# Patient Record
Sex: Male | Born: 1975 | Race: White | Hispanic: No | Marital: Married | Smoking: Former smoker
Health system: Southern US, Community
[De-identification: ages and names within clinical notes are randomized; demographics above are authoritative.]

## PROBLEM LIST (undated history)

## (undated) DIAGNOSIS — K219 Gastro-esophageal reflux disease without esophagitis: Secondary | ICD-10-CM

## (undated) DIAGNOSIS — E119 Type 2 diabetes mellitus without complications: Secondary | ICD-10-CM

## (undated) DIAGNOSIS — G4730 Sleep apnea, unspecified: Secondary | ICD-10-CM

## (undated) DIAGNOSIS — E66813 Obesity, class 3: Secondary | ICD-10-CM

## (undated) DIAGNOSIS — I10 Essential (primary) hypertension: Secondary | ICD-10-CM

## (undated) DIAGNOSIS — E785 Hyperlipidemia, unspecified: Secondary | ICD-10-CM

## (undated) DIAGNOSIS — M109 Gout, unspecified: Secondary | ICD-10-CM

## (undated) HISTORY — DX: Gastro-esophageal reflux disease without esophagitis: K21.9

## (undated) HISTORY — DX: Obesity, class 3: E66.813

## (undated) HISTORY — PX: SPINE SURGERY: SHX786

## (undated) HISTORY — DX: Essential (primary) hypertension: I10

## (undated) HISTORY — DX: Sleep apnea, unspecified: G47.30

## (undated) HISTORY — DX: Hyperlipidemia, unspecified: E78.5

## (undated) HISTORY — DX: Type 2 diabetes mellitus without complications: E11.9

## (undated) HISTORY — PX: BACK SURGERY: SHX140

## (undated) HISTORY — DX: Morbid (severe) obesity due to excess calories: E66.01

---

## 1998-03-30 ENCOUNTER — Emergency Department (HOSPITAL_COMMUNITY): Admission: EM | Admit: 1998-03-30 | Discharge: 1998-03-30 | Payer: Self-pay | Admitting: Emergency Medicine

## 1999-03-24 ENCOUNTER — Emergency Department (HOSPITAL_COMMUNITY): Admission: EM | Admit: 1999-03-24 | Discharge: 1999-03-24 | Payer: Self-pay

## 1999-07-26 ENCOUNTER — Emergency Department (HOSPITAL_COMMUNITY): Admission: EM | Admit: 1999-07-26 | Discharge: 1999-07-26 | Payer: Self-pay

## 1999-10-29 ENCOUNTER — Emergency Department (HOSPITAL_COMMUNITY): Admission: EM | Admit: 1999-10-29 | Discharge: 1999-10-29 | Payer: Self-pay | Admitting: Emergency Medicine

## 2001-03-06 ENCOUNTER — Ambulatory Visit (HOSPITAL_COMMUNITY): Admission: RE | Admit: 2001-03-06 | Discharge: 2001-03-06 | Payer: Self-pay | Admitting: Family Medicine

## 2001-03-06 ENCOUNTER — Encounter: Payer: Self-pay | Admitting: Family Medicine

## 2004-12-23 ENCOUNTER — Emergency Department (HOSPITAL_COMMUNITY): Admission: EM | Admit: 2004-12-23 | Discharge: 2004-12-23 | Payer: Self-pay | Admitting: Emergency Medicine

## 2010-01-27 ENCOUNTER — Emergency Department (HOSPITAL_COMMUNITY): Admission: EM | Admit: 2010-01-27 | Discharge: 2010-01-27 | Payer: Self-pay | Admitting: Emergency Medicine

## 2010-03-05 ENCOUNTER — Encounter: Admission: RE | Admit: 2010-03-05 | Discharge: 2010-03-05 | Payer: Self-pay | Admitting: Chiropractic Medicine

## 2010-05-28 ENCOUNTER — Ambulatory Visit (HOSPITAL_COMMUNITY): Admission: RE | Admit: 2010-05-28 | Discharge: 2010-05-29 | Payer: Self-pay | Admitting: Neurological Surgery

## 2010-10-21 LAB — SURGICAL PCR SCREEN
MRSA, PCR: NEGATIVE
Staphylococcus aureus: NEGATIVE

## 2010-10-21 LAB — CBC
HCT: 41.9 % (ref 39.0–52.0)
Hemoglobin: 14.5 g/dL (ref 13.0–17.0)
MCH: 30.5 pg (ref 26.0–34.0)
MCHC: 34.6 g/dL (ref 30.0–36.0)
MCV: 88.2 fL (ref 78.0–100.0)
Platelets: 184 10*3/uL (ref 150–400)
RBC: 4.75 MIL/uL (ref 4.22–5.81)
RDW: 12.6 % (ref 11.5–15.5)
WBC: 6.7 10*3/uL (ref 4.0–10.5)

## 2010-10-26 LAB — URINALYSIS, ROUTINE W REFLEX MICROSCOPIC
Ketones, ur: NEGATIVE mg/dL
Protein, ur: NEGATIVE mg/dL
Urobilinogen, UA: 0.2 mg/dL (ref 0.0–1.0)

## 2011-04-16 ENCOUNTER — Other Ambulatory Visit: Payer: Self-pay | Admitting: Internal Medicine

## 2011-04-16 DIAGNOSIS — R109 Unspecified abdominal pain: Secondary | ICD-10-CM

## 2011-04-21 ENCOUNTER — Other Ambulatory Visit: Payer: Self-pay | Admitting: Internal Medicine

## 2011-04-21 ENCOUNTER — Ambulatory Visit
Admission: RE | Admit: 2011-04-21 | Discharge: 2011-04-21 | Disposition: A | Discharge status: Home / Self Care | Payer: BC Managed Care – PPO | Source: Ambulatory Visit | Attending: Internal Medicine | Admitting: Internal Medicine

## 2011-04-21 DIAGNOSIS — R109 Unspecified abdominal pain: Secondary | ICD-10-CM

## 2011-04-21 MED ORDER — IOHEXOL 300 MG/ML  SOLN
125.0000 mL | Freq: Once | INTRAMUSCULAR | Status: AC | PRN
  Administered 2011-04-21: 125 mL via INTRAVENOUS

## 2011-05-24 ENCOUNTER — Encounter: Payer: Self-pay | Admitting: Internal Medicine

## 2011-05-28 ENCOUNTER — Ambulatory Visit (INDEPENDENT_AMBULATORY_CARE_PROVIDER_SITE_OTHER): Payer: BC Managed Care – PPO | Admitting: Internal Medicine

## 2011-05-28 ENCOUNTER — Encounter: Payer: Self-pay | Admitting: Internal Medicine

## 2011-05-28 VITALS — BP 128/86 | HR 80 | Ht 75.0 in | Wt 387.0 lb

## 2011-05-28 DIAGNOSIS — R933 Abnormal findings on diagnostic imaging of other parts of digestive tract: Secondary | ICD-10-CM

## 2011-05-28 NOTE — Progress Notes (Addendum)
  Subjective:    Patient ID: Miguel Sanders, male    DOB: 18-Jan-1976, 35 y.o.   MRN: [id number]  HPI This man presents with about a two-month history of right flank pain, he notices it when he sleeps and after getting up. After moving around for a couple of hours the pain seems to goal weight. No change in urination. He saw his primary care provider and had a CT of the abdomen and pelvis ordered as part of the workup. That demonstrated possible thickening in the distal stomach near the pylorus a slightly enlarged inferior gastrohepatic ligament lymph node. No pain with eating. No nausea or vomiting or other GI symptoms. No prior problems with right flank pain in the past, either. He does have a history of back pain. He did have lumbar disc surgery last year, but was having pain on the left side at that time. No recent regular use of ibuprofen or other anti-inflammatories.   Review of Systems As above, all other ROS negative    Objective:   Physical Exam Morbid obesity acute distress Eyes anicteric Mouth posterior pharynx clear, dentition in good repair Lungs clear Heart S1-S2 no thrills Abdomen is morbidly obese soft and nontender without organomegaly or mass He is awake and alert and oriented x3 with normal mood and affect       Assessment & Plan:  Abnormal stomach on CT abdomen and pelvis. Etiology unclear. He has a thickened distal stomach and mildly enlarged intrahepatic ligament lymph node as well. This may be artifact or real pathology.  I recommended an EGD to sort this out. Risks benefits indications are explained he understands and agrees to proceed. We'll perform hospital due to obesity, greater than 350 pounds. As far as obesity is concerned he is now below 400 pounds and is trying to lose weight and is aware of the problem  The right flank pain sounds musculoskeletal no etiology found on CT scan. Also note that September 25 labs show a normal comprehensive metabolic panel,  uric acid high at 9.4, a lipase is normal. Also note that the patient was recently laid off and would like to complete any workup before his insurance coverage terminates at the end of the month.

## 2011-05-28 NOTE — Patient Instructions (Signed)
You have been scheduled for an Endoscopy at [facility] on 06/04/11 at 9:15 am with separate instructions given.

## 2011-06-04 ENCOUNTER — Encounter: Payer: BC Managed Care – PPO | Admitting: Internal Medicine

## 2011-11-20 ENCOUNTER — Encounter: Payer: BC Managed Care – PPO | Admitting: Emergency Medicine

## 2011-11-20 NOTE — Progress Notes (Signed)
  Subjective:    Patient ID: Miguel Sanders, male    DOB: 04/16/1976, 36 y.o.   MRN: [id number]  HPIleft not seen    Review of Systems     Objective:   Physical Exam        Assessment & Plan:

## 2012-01-17 NOTE — Progress Notes (Signed)
This encounter was created in error - please disregard.

## 2012-02-02 ENCOUNTER — Encounter (HOSPITAL_COMMUNITY): Payer: Self-pay | Admitting: Emergency Medicine

## 2012-02-02 ENCOUNTER — Emergency Department (HOSPITAL_COMMUNITY)
Admission: EM | Admit: 2012-02-02 | Discharge: 2012-02-02 | Disposition: A | Discharge status: Home / Self Care | Payer: Self-pay | Attending: Emergency Medicine | Admitting: Emergency Medicine

## 2012-02-02 DIAGNOSIS — R7309 Other abnormal glucose: Secondary | ICD-10-CM | POA: Insufficient documentation

## 2012-02-02 DIAGNOSIS — R739 Hyperglycemia, unspecified: Secondary | ICD-10-CM

## 2012-02-02 DIAGNOSIS — Z87891 Personal history of nicotine dependence: Secondary | ICD-10-CM | POA: Insufficient documentation

## 2012-02-02 DIAGNOSIS — R109 Unspecified abdominal pain: Secondary | ICD-10-CM | POA: Insufficient documentation

## 2012-02-02 HISTORY — DX: Gout, unspecified: M10.9

## 2012-02-02 LAB — CBC
HCT: 38.1 % — ABNORMAL LOW (ref 39.0–52.0)
Hemoglobin: 13.3 g/dL (ref 13.0–17.0)
MCH: 29.9 pg (ref 26.0–34.0)
MCHC: 34.9 g/dL (ref 30.0–36.0)
RDW: 12.5 % (ref 11.5–15.5)

## 2012-02-02 LAB — DIFFERENTIAL
Basophils Absolute: 0 10*3/uL (ref 0.0–0.1)
Basophils Relative: 0 % (ref 0–1)
Eosinophils Absolute: 0.3 10*3/uL (ref 0.0–0.7)
Monocytes Absolute: 0.5 10*3/uL (ref 0.1–1.0)
Monocytes Relative: 7 % (ref 3–12)
Neutro Abs: 4.3 10*3/uL (ref 1.7–7.7)
Neutrophils Relative %: 58 % (ref 43–77)

## 2012-02-02 LAB — URINALYSIS, ROUTINE W REFLEX MICROSCOPIC
Bilirubin Urine: NEGATIVE
Ketones, ur: NEGATIVE mg/dL
Leukocytes, UA: NEGATIVE
Nitrite: NEGATIVE
Protein, ur: NEGATIVE mg/dL

## 2012-02-02 LAB — URINE MICROSCOPIC-ADD ON

## 2012-02-02 LAB — COMPREHENSIVE METABOLIC PANEL
AST: 28 U/L (ref 0–37)
Albumin: 3.2 g/dL — ABNORMAL LOW (ref 3.5–5.2)
BUN: 8 mg/dL (ref 6–23)
Chloride: 100 mEq/L (ref 96–112)
Creatinine, Ser: 0.81 mg/dL (ref 0.50–1.35)
Total Protein: 7.2 g/dL (ref 6.0–8.3)

## 2012-02-02 LAB — LIPASE, BLOOD: Lipase: 27 U/L (ref 11–59)

## 2012-02-02 MED ORDER — HYDROCODONE-ACETAMINOPHEN 5-500 MG PO TABS: 1.0000 | ORAL_TABLET | Freq: Four times a day (QID) | ORAL | Status: AC | PRN

## 2012-02-02 MED ORDER — METFORMIN HCL 500 MG PO TABS: 500.0000 mg | ORAL_TABLET | Freq: Two times a day (BID) | ORAL | Status: DC

## 2012-02-02 MED ORDER — HYDROCODONE-ACETAMINOPHEN 5-325 MG PO TABS
1.0000 | ORAL_TABLET | Freq: Once | ORAL | Status: AC
  Administered 2012-02-02: 1 via ORAL
  Filled 2012-02-02: qty 1

## 2012-02-02 NOTE — ED Provider Notes (Signed)
History     CSN: [id number]  Arrival date & time 02/02/12  4098   First MD Initiated Contact with Patient 02/02/12 [phone]      Chief Complaint  Patient presents with  . Flank Pain    (Consider location/radiation/quality/duration/timing/severity/associated sxs/prior treatment) Patient is a 36 y.o. male presenting with flank pain. The history is provided by the patient.  Flank Pain This is a new problem. The current episode started today. Associated symptoms include abdominal pain. Pertinent negatives include no chest pain, chills, fever, nausea or vomiting.  Pt states he was awaken from sleep by  A pain in his right flank. States pain is dull, constant, 5/10. Radiates around to the front. No fever, chills, nausea, vomiting, SOB. Denies prior evaluations for the same. Normal BM and urine this morning.   Past Medical History  Diagnosis Date  . Gout   . Gout     Past Surgical History  Procedure Date  . Back surgery     Family History  Problem Relation Age of Onset  . Colon cancer Maternal Grandfather 75  . Cancer Other   . Hypertension Other     History  Substance Use Topics  . Smoking status: Former Smoker    Quit date: 08/09/2009  . Smokeless tobacco: Never Used  . Alcohol Use: No      Review of Systems  Constitutional: Negative for fever and chills.  Respiratory: Negative for chest tightness and wheezing.   Cardiovascular: Negative for chest pain.  Gastrointestinal: Positive for abdominal pain. Negative for nausea, vomiting, diarrhea, constipation and blood in stool.  Genitourinary: Positive for flank pain. Negative for dysuria and hematuria.    Allergies  Review of patient's allergies indicates no known allergies.  Home Medications   Current Outpatient Rx  Name Route Sig Dispense Refill  . IBUPROFEN 200 MG PO TABS Oral Take 200 mg by mouth every 6 (six) hours as needed.      Marland Kitchen HYDROCODONE-ACETAMINOPHEN 5-325 MG PO TABS Oral Take 1 tablet by mouth every 6  (six) hours as needed.        BP 140/82  Pulse 89  Temp 98.3 F (36.8 C) (Oral)  Resp 20  SpO2 98%  Physical Exam  Nursing note and vitals reviewed. Constitutional: He is oriented to person, place, and time. He appears well-developed and well-nourished. No distress.  Eyes: Conjunctivae are normal.  Neck: Neck supple.  Cardiovascular: Normal rate, regular rhythm and normal heart sounds.   Pulmonary/Chest: Effort normal and breath sounds normal. No respiratory distress. He has no wheezes. He has no rales.  Abdominal: Soft. Bowel sounds are normal. There is tenderness.       Abdomen obese, tender to palpation in right flank. No CVA tenderness  Neurological: He is alert and oriented to person, place, and time.  Skin: Skin is warm and dry.  Psychiatric: He has a normal mood and affect.    ED Course  Procedures (including critical care time)  6:25 AM Pt seen and examined, pain in right flank. No hx of the same, although there is a CT from 9/12 which was ordered for the same complaint. Labs ordered, ua pending. Pt does not want pain medication at this time.   Results for orders placed during the hospital encounter of 02/02/12  URINALYSIS, ROUTINE W REFLEX MICROSCOPIC      Component Value Range   Color, Urine YELLOW  YELLOW   APPearance CLEAR  CLEAR   Specific Gravity, Urine 1.031 (*) 1.005 -  1.030   pH 5.5  5.0 - 8.0   Glucose, UA >1000 (*) NEGATIVE mg/dL   Hgb urine dipstick NEGATIVE  NEGATIVE   Bilirubin Urine NEGATIVE  NEGATIVE   Ketones, ur NEGATIVE  NEGATIVE mg/dL   Protein, ur NEGATIVE  NEGATIVE mg/dL   Urobilinogen, UA 0.2  0.0 - 1.0 mg/dL   Nitrite NEGATIVE  NEGATIVE   Leukocytes, UA NEGATIVE  NEGATIVE  CBC      Component Value Range   WBC 7.3  4.0 - 10.5 K/uL   RBC 4.45  4.22 - 5.81 MIL/uL   Hemoglobin 13.3  13.0 - 17.0 g/dL   HCT 16.1 (*) 09.6 - 04.5 %   MCV 85.6  78.0 - 100.0 fL   MCH 29.9  26.0 - 34.0 pg   MCHC 34.9  30.0 - 36.0 g/dL   RDW 40.9  81.1 - 91.4  %   Platelets 174  150 - 400 K/uL  DIFFERENTIAL      Component Value Range   Neutrophils Relative 58  43 - 77 %   Neutro Abs 4.3  1.7 - 7.7 K/uL   Lymphocytes Relative 30  12 - 46 %   Lymphs Abs 2.2  0.7 - 4.0 K/uL   Monocytes Relative 7  3 - 12 %   Monocytes Absolute 0.5  0.1 - 1.0 K/uL   Eosinophils Relative 5  0 - 5 %   Eosinophils Absolute 0.3  0.0 - 0.7 K/uL   Basophils Relative 0  0 - 1 %   Basophils Absolute 0.0  0.0 - 0.1 K/uL  COMPREHENSIVE METABOLIC PANEL      Component Value Range   Sodium 133 (*) 135 - 145 mEq/L   Potassium 3.5  3.5 - 5.1 mEq/L   Chloride 100  96 - 112 mEq/L   CO2 23  19 - 32 mEq/L   Glucose, Bld 264 (*) 70 - 99 mg/dL   BUN 8  6 - 23 mg/dL   Creatinine, Ser 7.82  0.50 - 1.35 mg/dL   Calcium 8.9  8.4 - 95.6 mg/dL   Total Protein 7.2  6.0 - 8.3 g/dL   Albumin 3.2 (*) 3.5 - 5.2 g/dL   AST 28  0 - 37 U/L   ALT 43  0 - 53 U/L   Alkaline Phosphatase 88  39 - 117 U/L   Total Bilirubin 0.2 (*) 0.3 - 1.2 mg/dL   GFR calc non Af Amer >90  >90 mL/min   GFR calc Af Amer >90  >90 mL/min  LIPASE, BLOOD      Component Value Range   Lipase 27  11 - 59 U/L  URINE MICROSCOPIC-ADD ON      Component Value Range   Squamous Epithelial / LPF RARE  RARE   WBC, UA 0-2  <3 WBC/hpf   RBC / HPF 0-2  <3 RBC/hpf   Bacteria, UA FEW (*) RARE   Urine-Other MUCOUS PRESENT    GLUCOSE, CAPILLARY      Component Value Range   Glucose-Capillary 283 (*) 70 - 99 mg/dL   Comment 1 Notify RN     No results found.  Pt with new hyperglycemia. Rest of the labs and UA unremarkable.  Abdomen non tender other then in right flank. No acute abdomen. It is possible that pt may have a kidney stone although no hemoglobin noted in his UA. Also possible that the pain could be related to his back or musculoskeletal given there is no  abdominal tenderness on exam. Will start on metformin for diabetes, pain medications for several days. Will follow up with PCP. Instructed to return if fever,  nausea, vomiting, abdominal pain.    1. Hyperglycemia   2. Acute right flank pain       MDM         Lottie Mussel, PA 02/02/12 [phone]

## 2012-02-02 NOTE — ED Notes (Signed)
Pt reports Rt flank pain that began this a.m. - pt states pain increases w/ movement and tender to palpation. Pt denies any hematuria, n/v/d, chills, or fever. Pt resting comfortably on bed, states he does not need pain medication at this time, pain is tolerable. Pt in no acute distress, A&Ox4. Family at bedside x1.

## 2012-02-02 NOTE — ED Notes (Signed)
Pt states he's been having R flank pain since 2 am this morning, denies n/v/d, states he did have 2 BM's but they were normal, pt states it's a constant dull pain, not sharp, worse when walking. Pt's sitting in chair in no distress. Wife w/ pt. Wife given coffee.

## 2012-02-02 NOTE — ED Provider Notes (Signed)
Medical screening examination/treatment/procedure(s) were performed by non-physician practitioner and as supervising physician I was immediately available for consultation/collaboration.  Gatsby Chismar M Aliana Kreischer, MD 02/02/12 1733 

## 2012-02-02 NOTE — Discharge Instructions (Signed)
I do not think that you have any major process in your abdomen. It may be that your pain is musculoskeletal. Take vicodin as prescribed for pain as needed. Do come back if increased pain in abdomen, nausea, vomiting, fever, or any new concerning symptom. Your blood sugar is elevated here in emergency department, which suggest new onset of diabetes. Take metformin as prescribed for high blood sugar. Watch your diet, exercise daily. Follow up with a primary care doctor.   Hyperglycemia Hyperglycemia occurs when the glucose (sugar) in your blood is too high. Hyperglycemia can happen for many reasons, but it most often happens to people who do not know they have diabetes or are not managing their diabetes properly.  CAUSES  Whether you have diabetes or not, there are other causes of hyperglycemia. Hyperglycemia can occur when you have diabetes, but it can also occur in other situations that you might not be as aware of, such as: Diabetes  If you have diabetes and are having problems controlling your blood glucose, hyperglycemia could occur because of some of the following reasons:   Not following your meal plan.   Not taking your diabetes medications or not taking it properly.   Exercising less or doing less activity than you normally do.   Being sick.  Pre-diabetes  This cannot be ignored. Before people develop Type 2 diabetes, they almost always have "pre-diabetes." This is when your blood glucose levels are higher than normal, but not yet high enough to be diagnosed as diabetes. Research has shown that some long-term damage to the body, especially the heart and circulatory system, may already be occurring during pre-diabetes. If you take action to manage your blood glucose when you have pre-diabetes, you may delay or prevent Type 2 diabetes from developing.  Stress  If you have diabetes, you may be "diet" controlled or on oral medications or insulin to control your diabetes. However, you may  find that your blood glucose is higher than usual in the hospital whether you have diabetes or not. This is often referred to as "stress hyperglycemia." Stress can elevate your blood glucose. This happens because of hormones put out by the body during times of stress. If stress has been the cause of your high blood glucose, it can be followed regularly by your caregiver. That way he/she can make sure your hyperglycemia does not continue to get worse or progress to diabetes.  Steroids  Steroids are medications that act on the infection fighting system (immune system) to block inflammation or infection. One side effect can be a rise in blood glucose. Most people can produce enough extra insulin to allow for this rise, but for those who cannot, steroids make blood glucose levels go even higher. It is not unusual for steroid treatments to "uncover" diabetes that is developing. It is not always possible to determine if the hyperglycemia will go away after the steroids are stopped. A special blood test called an A1c is sometimes done to determine if your blood glucose was elevated before the steroids were started.  SYMPTOMS  Thirsty.   Frequent urination.   Dry mouth.   Blurred vision.   Tired or fatigue.   Weakness.   Sleepy.   Tingling in feet or leg.  DIAGNOSIS  Diagnosis is made by monitoring blood glucose in one or all of the following ways:  A1c test. This is a chemical found in your blood.   Fingerstick blood glucose monitoring.   Laboratory results.  TREATMENT  First,  knowing the cause of the hyperglycemia is important before the hyperglycemia can be treated. Treatment may include, but is not be limited to:  Education.   Change or adjustment in medications.   Change or adjustment in meal plan.   Treatment for an illness, infection, etc.   More frequent blood glucose monitoring.   Change in exercise plan.   Decreasing or stopping steroids.   Lifestyle changes.  HOME  CARE INSTRUCTIONS   Test your blood glucose as directed.   Exercise regularly. Your caregiver will give you instructions about exercise. Pre-diabetes or diabetes which comes on with stress is helped by exercising.   Eat wholesome, balanced meals. Eat often and at regular, fixed times. Your caregiver or nutritionist will give you a meal plan to guide your sugar intake.   Being at an ideal weight is important. If needed, losing as little as 10 to 15 pounds may help improve blood glucose levels.  SEEK MEDICAL CARE IF:   You have questions about medicine, activity, or diet.   You continue to have symptoms (problems such as increased thirst, urination, or weight gain).  SEEK IMMEDIATE MEDICAL CARE IF:   You are vomiting or have diarrhea.   Your breath smells fruity.   You are breathing faster or slower.   You are very sleepy or incoherent.   You have numbness, tingling, or pain in your feet or hands.   You have chest pain.   Your symptoms get worse even though you have been following your caregiver's orders.   If you have any other questions or concerns.  Document Released: 01/19/2001 Document Revised: 07/15/2011 Document Reviewed: 03/17/2009 [facility] Patient Information 2012 [place], [place].  Flank Pain Flank pain refers to pain that is located on the side of the body between the upper abdomen and the back. It can be caused by many things. CAUSES  Some of the more common causes of flank pain include:  Muscle strain.   Muscle spasms.   A disease of your spine (vertebral disk disease).   A lung infection (pneumonia).   Fluid around your lungs (pulmonary edema).   A kidney infection.   Kidney stones.   A very painful skin rash on only one side of your body (shingles).   Gallbladder disease.  DIAGNOSIS  Blood tests, urine tests, and X-rays may help your caregiver determine what is wrong. TREATMENT  The treatment of pain depends on the cause. Your caregiver will  determine what treatment will work best for you. HOME CARE INSTRUCTIONS   Home care will depend on the cause of your pain.   Some medications may help relieve the pain. Take medication for relief of pain as directed by your caregiver.   Tell your caregiver about any changes in your pain.   Follow up with your caregiver.  SEEK IMMEDIATE MEDICAL CARE IF:   Your pain is not controlled with medication.   The pain increases.   You have abdominal pain.   You have shortness of breath.   You have persistent nausea or vomiting.   You have swelling in your abdomen.   You feel faint or pass out.   You have a temperature by mouth above 102 F (38.9 C), not controlled by medicine.  MAKE SURE YOU:   Understand these instructions.   Will watch your condition.   Will get help right away if you are not doing well or get worse.  Document Released: 09/16/2005 Document Revised: 07/15/2011 Document Reviewed: 01/10/2010 ExitCare Patient Information  [address], [place].  RESOURCE GUIDE  Chronic Pain Problems: Contact [facility]  [phone] Patients need to be referred by their primary care doctor.  Insufficient Money for Medicine: Contact United Way:  call "211" or [facility] 510-210-5188.  No Primary Care Doctor: - Call [facility]  [phone] - can help you locate a primary care doctor that  accepts your insurance, provides certain services, etc. - Physician Referral Service- [phone]  Agencies that provide inexpensive medical care: - [facility]  [phone] - [facility]  [phone] - [facility]  [phone] - Women's Clinic  [phone] - Planned Parenthood  [phone] [facility]  [phone]  Medicaid-accepting [facility] Providers: - [facility]- [address]  [phone], Mon-Fri 9am-7pm, Sat 9am-1pm - [facility]- [address]  [place], [address] [place]  [phone] - [facility]- [address] [place]  [phone] [facility]- [address]  [phone] - Renaye Rakers- [address] [place], [address], [phone]  Only accepts [place] patients after they have their name  applied to their card  Self Pay (no insurance) in [place]: - Sickle Cell Patients: Dr Willey Blade, [facility]  [address] [place], [phone] - [facility]- [address] [place]  [phone]       [facility] [place]- [address]       -     [facility]- see information above (Speak to [employer] if you do not have insurance)       -  [facility]- [address] [place], [phone]       -  [facility]- [address],  [phone]       -  [facility]- [address], [phone]       -  Dr Julio Sicks-  [address], [place], [phone]       -  [facility]- [address], [phone]       -  [facility]- [address], [phone], also [address] [phone]       -    [facility]- [address] [place], [phone], 1st & 3rd Saturday   every month, 10am-1pm  1) Find a Doctor and Pay Out of Pocket Although you won't have to find out who is covered by your insurance plan, it is a good idea to ask around and get recommendations. You will then need to call the office and see if the doctor you have chosen will accept you as a new patient and what types of options they offer for patients who are self-pay. Some doctors offer discounts or will set up payment plans for their patients who do not have insurance, but you will need to ask so you aren't surprised when you get to your appointment.  2) Contact Your Local Health Department Not all health departments have doctors that can see patients for sick visits, but many do, so it is worth a call to see if  yours does. If you don't know where your local health department is, you can check in your phone book. The CDC also has a tool to help you locate your state's health department, and  many state websites also have listings of all of their local health departments.  3) Find a Walk-in Clinic If your illness is not likely to be very severe or complicated, you may want to try a walk in clinic. These are popping up all over the country in pharmacies, drugstores, and shopping centers. They're usually staffed by nurse practitioners or physician assistants that have been trained to treat common illnesses and complaints. They're usually fairly quick and inexpensive. However, if you have serious medical issues or chronic medical problems, these are probably not your best option  STD Testing - [facility] Department of [facility] [place], STD Clinic, [address] [place], phone [phone] or [phone].  Monday - Friday, call for an appointment. [facility] Department of [employer], STD Clinic, [address], [place], phone [phone] or [phone].  Monday - Friday, call for an appointment.  Abuse/Neglect: [facility] Child Abuse Hotline (270)275-5461 [facility] Child Abuse Hotline (307) 560-5440 (After Hours)  Emergency Shelter:  [facility] 725-475-1730  Maternity Homes: - [facility] [place] [facility] 586-654-6527 - [facility] (626)687-5183  MRSA Hotline #:   (339)039-4055  [facility] Resources  [facility] [place]  United Way [facility] Dept. [address]                 [address]         [address] [place] [address]  Blondell Reveal Phone:  [phone]                                  Phone:  ([phone]) [phone]                   Phone:  [phone]  [facility] Mental Health,  [phone] - [facility] - [facility](386) [phone]       -     [facility] in [place], [address],                                  [phone], [facility] Child Abuse Hotline 321-606-3604 or 469-004-6099 (After Hours)   Behavioral Health Services  Substance Abuse Resources: - [facility] and Drug Services  (209)267-9004 - [facility] 207-749-4208 - [facility] [place] [phone] Floydene Flock (564)097-2799 - Residential & Outpatient Substance Abuse Program  587-452-6349  Psychological Services: [facility]  ([phone] Services  [phone] - [facility], [phone] [place]. [address], [place], ACCESS LINE: 862-680-3508 or (423)050-2757, [employer]  Dental Assistance  If unable to pay or uninsured, contact:  [facility] or [facility]. to become qualified for the adult dental clinic.  Patients with Medicaid: [facility] [phone] [address], [phone] [address],  [phone]  If unable to pay, or uninsured, contact [facility] (458)776-8722) or [facility] Department (520)[phone] in [place], [phone] in [facility]) to become qualified for the adult dental clinic  Other Low-Cost Community Dental Services: - [facility]- [address] [place], [place], [place], [address], [phone], [phone], 2nd and 4th Thursday of the month at 6:30am.  10 clients each day by appointment, can sometimes see walk-in patients if someone does not show for an appointment. [facility]- [address] Ether Griffins [place], [place], [address], [phone] - [facility]- [address] [place], [place], [address], [phone] - [facility]- [phone] [facility]- [phone] [facility] Department- [phone]

## 2012-02-02 NOTE — ED Notes (Signed)
Pt given discharge instructions/explained, in no distress, escorted to discharge window.

## 2012-02-02 NOTE — ED Notes (Signed)
Pt states he woke up this morning around 2 am with right flank pain  Denies N/V

## 2012-06-08 ENCOUNTER — Ambulatory Visit (INDEPENDENT_AMBULATORY_CARE_PROVIDER_SITE_OTHER): Payer: Managed Care, Other (non HMO) | Admitting: Family Medicine

## 2012-06-08 VITALS — BP 135/82 | HR 88 | Temp 98.5°F | Resp 18 | Wt 376.0 lb

## 2012-06-08 DIAGNOSIS — B379 Candidiasis, unspecified: Secondary | ICD-10-CM

## 2012-06-08 DIAGNOSIS — R358 Other polyuria: Secondary | ICD-10-CM

## 2012-06-08 DIAGNOSIS — R3589 Other polyuria: Secondary | ICD-10-CM

## 2012-06-08 DIAGNOSIS — R631 Polydipsia: Secondary | ICD-10-CM

## 2012-06-08 DIAGNOSIS — R7309 Other abnormal glucose: Secondary | ICD-10-CM

## 2012-06-08 DIAGNOSIS — IMO0001 Reserved for inherently not codable concepts without codable children: Secondary | ICD-10-CM

## 2012-06-08 DIAGNOSIS — R350 Frequency of micturition: Secondary | ICD-10-CM

## 2012-06-08 DIAGNOSIS — R739 Hyperglycemia, unspecified: Secondary | ICD-10-CM

## 2012-06-08 LAB — POCT URINALYSIS DIPSTICK
Bilirubin, UA: NEGATIVE
Ketones, UA: 40
Leukocytes, UA: NEGATIVE
Protein, UA: NEGATIVE
Spec Grav, UA: <=1.005
pH, UA: 5.5

## 2012-06-08 LAB — POCT CBC
MCH, POC: 29.2 pg (ref 27–31.2)
MCHC: 32 g/dL (ref 31.8–35.4)
MID (cbc): 0.5 (ref 0–0.9)
MPV: 13.5 fL (ref 0–99.8)
POC Granulocyte: 4.1 (ref 2–6.9)
POC MID %: 7.2 %M (ref 0–12)
Platelet Count, POC: 204 10*3/uL (ref 142–424)
RBC: 5.27 M/uL (ref 4.69–6.13)
WBC: 6.7 10*3/uL (ref 4.6–10.2)

## 2012-06-08 LAB — POCT UA - MICROSCOPIC ONLY
Bacteria, U Microscopic: NEGATIVE
Casts, Ur, LPF, POC: NEGATIVE
Epithelial cells, urine per micros: NEGATIVE
Mucus, UA: NEGATIVE
Yeast, UA: NEGATIVE

## 2012-06-08 MED ORDER — GLIPIZIDE 5 MG PO TABS: 5.0000 mg | ORAL_TABLET | Freq: Two times a day (BID) | ORAL | Status: DC

## 2012-06-08 MED ORDER — COLCHICINE 0.6 MG PO TABS: ORAL_TABLET | ORAL | Status: DC

## 2012-06-08 MED ORDER — KETOCONAZOLE 2 % EX CREA: TOPICAL_CREAM | Freq: Two times a day (BID) | CUTANEOUS | Status: DC

## 2012-06-08 MED ORDER — METFORMIN HCL 500 MG PO TABS: 500.0000 mg | ORAL_TABLET | Freq: Two times a day (BID) | ORAL | Status: DC

## 2012-06-08 NOTE — Progress Notes (Signed)
[address]   [place], [place]  [address]   [phone]  Subjective:    Patient ID: Miguel Sanders, male    DOB: September 23, 1975, 36 y.o.   MRN: [id number]  HPIThis 36 y.o. male presents for evaluation of dry mouth, blurred vision, urinary frequency.  Onset two weeks ago with very dry mouth, very thirsty, blurred vision while driving at a distance.  Increased urination with raw area along penis.  Went to ED 02/2012, with hyperglycemia of 263.  Prescribed medication in ED Metformin but thought was due to kidney pain.  Decreased appetite; has lost 50 pounds since 2 months ago.  Has cut out ice cream, eating sweets.  Very thirsty; drinking sodas, tea, water, juice.  Drinking 2-3 gallons of fluids per day.  Eating gum to help dry mouth.  Nocturia x 3 last night which was great.  No family history of DMII.  No dizziness.  Also has gout so limits meat intake.  Denies chest pain, palpitations, shortness of breath, leg swelling.  No nausea, vomiting, abdominal pain,diarrhea, bloody stools.  +dysuria due to penile irritation. B:  Skip; gatorade    Snack: nothing.  Lunch:  None.  Snack: none  Supper:  Sandwich 1/2 (ham), chips potatoe, grapes, cookies, water.    PCP: [facility]   Review of Systems  Constitutional: Positive for appetite change and unexpected weight change. Negative for fever, chills, diaphoresis and fatigue.  Eyes: Positive for visual disturbance.  Respiratory: Negative for cough, shortness of breath and wheezing.   Cardiovascular: Negative for chest pain, palpitations and leg swelling.  Gastrointestinal: Negative for nausea, vomiting, abdominal pain, diarrhea, constipation and abdominal distention.  Genitourinary: Positive for dysuria, urgency, frequency, genital sores and penile pain. Negative for hematuria, flank pain, discharge, penile swelling, scrotal swelling, enuresis and testicular pain.  Skin: Positive for rash.  Neurological: Negative for dizziness, tremors, seizures, syncope, speech  difficulty, weakness, light-headedness, numbness and headaches.    Past Medical History  Diagnosis Date  . Gout   . Gout     Past Surgical History  Procedure Date  . Back surgery   . Spine surgery     Prior to Admission medications   Medication Sig Start Date End Date Taking? Authorizing Provider  HYDROcodone-acetaminophen (NORCO) 5-325 MG per tablet Take 1 tablet by mouth every 6 (six) hours as needed.      Historical Provider, MD  ibuprofen (ADVIL,MOTRIN) 200 MG tablet Take 200 mg by mouth every 6 (six) hours as needed.      Historical Provider, MD  metFORMIN (GLUCOPHAGE) 500 MG tablet Take 1 tablet (500 mg total) by mouth 2 (two) times daily with a meal. 02/02/12 02/01/13  Tatyana A Kirichenko, PA    No Known Allergies  History   Social History  . Marital Status: Married    Spouse Name: N/A    Number of Children: 2  . Years of Education: N/A   Occupational History  . unemployed    Social History Main Topics  . Smoking status: Former Smoker    Quit date: 08/09/2009  . Smokeless tobacco: Never Used  . Alcohol Use: No  . Drug Use: No  . Sexually Active: Yes   Other Topics Concern  . Not on file   Social History Narrative   Marital status: married x 4 years.  Children: 2 children previous marriage  Lives: wife.    Employment: Conservator, museum/gallery.   Tobacco: quit; smoked x 13 years.   Alcohol: once every three  months.   Drugs: none now; marijuana in past.   Exercise: none    Family History  Problem Relation Age of Onset  . Colon cancer Maternal Grandfather 67  . Cancer Other   . Hypertension Other   . Arthritis Mother        Objective:   Physical Exam  Nursing note and vitals reviewed. Constitutional: He is oriented to person, place, and time. He appears well-developed and well-nourished. No distress.  HENT:  Head: Normocephalic and atraumatic.  Right Ear: External ear normal.  Left Ear: External ear normal.  Nose: Nose normal.  Mouth/Throat: Oropharynx is  clear and moist.       TONGUE BEEFY RED.  Eyes: Conjunctivae normal and EOM are normal. Pupils are equal, round, and reactive to light.  Neck: Normal range of motion. Neck supple. No thyromegaly present.       OBESE.  Cardiovascular: Normal rate, regular rhythm, normal heart sounds and intact distal pulses.  Exam reveals no gallop.   No murmur heard. Pulmonary/Chest: Breath sounds normal. No respiratory distress. He has no wheezes. He has no rales.  Abdominal: Soft. Bowel sounds are normal. There is no tenderness. There is no rebound and no guarding.  Genitourinary: Testes normal. Right testis shows no mass, no swelling and no tenderness. Left testis shows no mass, no swelling and no tenderness. Circumcised. Penile erythema and penile tenderness present.       ERYTHEMA DISTAL PENIS; +FISSURING ALONG SKIN FOLDS; ERYTHEMA; MILD DISCHARGE WHITE ALONG FISSURES.  Lymphadenopathy:    He has no cervical adenopathy.  Neurological: He is alert and oriented to person, place, and time. No cranial nerve deficit. He exhibits normal muscle tone. Coordination normal.  Skin: He is not diaphoretic.  Psychiatric: He has a normal mood and affect. His behavior is normal. Judgment and thought content normal.        Results for orders placed in visit on 06/08/12  POCT URINALYSIS DIPSTICK      Component Value Range   Color, UA bright yellow     Clarity, UA clear     Glucose, UA >=1000     Bilirubin, UA neg     Ketones, UA 40     Spec Grav, UA <=1.005     Blood, UA trace-intact     pH, UA 5.5     Protein, UA neg     Urobilinogen, UA 0.2     Nitrite, UA neg     Leukocytes, UA Negative    POCT UA - MICROSCOPIC ONLY      Component Value Range   WBC, Ur, HPF, POC neg     RBC, urine, microscopic neg     Bacteria, U Microscopic neg     Mucus, UA neg     Epithelial cells, urine per micros neg     Crystals, Ur, HPF, POC neg     Casts, Ur, LPF, POC neg     Yeast, UA neg    POCT GLYCOSYLATED HEMOGLOBIN  (HGB A1C)      Component Value Range   Hemoglobin A1C 10.9    GLUCOSE, POCT (MANUAL RESULT ENTRY)      Component Value Range   POC Glucose 339 (*) 70 - 99 mg/dl  POCT CBC      Component Value Range   WBC 6.7  4.6 - 10.2 K/uL   Lymph, poc 2.1  0.6 - 3.4   POC LYMPH PERCENT 31.2  10 - 50 %L   MID (cbc)  0.5  0 - 0.9   POC MID % 7.2  0 - 12 %M   POC Granulocyte 4.1  2 - 6.9   Granulocyte percent 61.6  37 - 80 %G   RBC 5.27  4.69 - 6.13 M/uL   Hemoglobin 15.4  14.1 - 18.1 g/dL   HCT, POC 19.1  47.8 - 53.7 %   MCV 91.3  80 - 97 fL   MCH, POC 29.2  27 - 31.2 pg   MCHC 32.0  31.8 - 35.4 g/dL   RDW, POC 29.5     Platelet Count, POC 204  142 - 424 K/uL   MPV 13.5  0 - 99.8 fL    Assessment & Plan:   1. Urine frequency  POCT urinalysis dipstick, POCT UA - Microscopic Only  2. Candidiasis  Comprehensive metabolic panel, TSH, T4, Free  3. Polydipsia  POCT urinalysis dipstick, POCT UA - Microscopic Only  4. Hyperglycemia  POCT glycosylated hemoglobin (Hb A1C), POCT glucose (manual entry), POCT CBC, Comprehensive metabolic panel, TSH, T4, Free  5. Polyuria  POCT urinalysis dipstick, POCT UA - Microscopic Only  6. Type II or unspecified type diabetes mellitus without mention of complication, uncontrolled      1. Urinary frequency:  New.  Secondary to new onset uncontrolled DMII.   2. Candidiasis Genital:  New.  Treat with Ketoconazole cream bid for two weeks.   3. DMII: New onset.  Counseled extensively during visit.  Refer for diabetic education/nutrition consultation.  Rx for Metformin 500mg  bid, Glucotrol 5mg  bid.  Close follow-up. 4. Gout:  Stable; refill of Colchicine provided.   Meds ordered this encounter  Medications  . DISCONTD: metFORMIN (GLUCOPHAGE) 500 MG tablet    Sig: Take 1 tablet (500 mg total) by mouth 2 (two) times daily with a meal.    Dispense:  60 tablet    Refill:  2  . DISCONTD: glipiZIDE (GLUCOTROL) 5 MG tablet    Sig: Take 1 tablet (5 mg total) by mouth 2  (two) times daily before a meal.    Dispense:  60 tablet    Refill:  2  . ketoconazole (NIZORAL) 2 % cream    Sig: Apply topically 2 (two) times daily.    Dispense:  30 g    Refill:  0  . colchicine 0.6 MG tablet    Sig: One pill three times daily PRN gouty attack    Dispense:  30 tablet    Refill:  1

## 2012-06-08 NOTE — Patient Instructions (Addendum)
1. Urine frequency  POCT urinalysis dipstick, POCT UA - Microscopic Only  2. Candidiasis  Comprehensive metabolic panel, TSH, T4, Free  3. Polydipsia  POCT urinalysis dipstick, POCT UA - Microscopic Only  4. Hyperglycemia  POCT glycosylated hemoglobin (Hb A1C), POCT glucose (manual entry), POCT CBC, Comprehensive metabolic panel, TSH, T4, Free  5. Polyuria  POCT urinalysis dipstick, POCT UA - Microscopic Only  6. Type II or unspecified type diabetes mellitus without mention of complication, uncontrolled  metFORMIN (GLUCOPHAGE) 500 MG tablet, glipiZIDE (GLUCOTROL) 5 MG tablet, Ambulatory referral to diabetic education

## 2012-06-09 LAB — COMPREHENSIVE METABOLIC PANEL
ALT: 48 U/L (ref 0–53)
AST: 54 U/L — ABNORMAL HIGH (ref 0–37)
Chloride: 93 mEq/L — ABNORMAL LOW (ref 96–112)
Creat: 1.14 mg/dL (ref 0.50–1.35)
Sodium: 132 mEq/L — ABNORMAL LOW (ref 135–145)
Total Bilirubin: 0.5 mg/dL (ref 0.3–1.2)
Total Protein: 8.2 g/dL (ref 6.0–8.3)

## 2012-06-13 ENCOUNTER — Encounter: Payer: Managed Care, Other (non HMO) | Attending: Family Medicine | Admitting: *Deleted

## 2012-06-13 VITALS — Ht 74.0 in | Wt 379.9 lb

## 2012-06-13 DIAGNOSIS — E119 Type 2 diabetes mellitus without complications: Secondary | ICD-10-CM | POA: Insufficient documentation

## 2012-06-13 DIAGNOSIS — E66813 Obesity, class 3: Secondary | ICD-10-CM

## 2012-06-13 DIAGNOSIS — Z713 Dietary counseling and surveillance: Secondary | ICD-10-CM | POA: Insufficient documentation

## 2012-06-13 DIAGNOSIS — IMO0001 Reserved for inherently not codable concepts without codable children: Secondary | ICD-10-CM

## 2012-06-14 NOTE — Progress Notes (Signed)
  Patient was seen on 06/13/2012 for the first of a series of three diabetes self-management courses at the [facility]. Current A1c on 11/31/13 = 10.9% The following learning objectives were met by the patient during this course:   Defines the role of glucose and insulin  Identifies type of diabetes and pathophysiology  Defines the diagnostic criteria for diabetes and prediabetes  States the risk factors for Type 2 Diabetes  States the symptoms of Type 2 Diabetes  Defines Type 2 Diabetes treatment goals  Defines Type 2 Diabetes treatment options  States the rationale for glucose monitoring  Identifies A1C, glucose targets, and testing times  Identifies proper sharps disposal  Defines the purpose of a diabetes food plan  Identifies carbohydrate food groups  Defines effects of carbohydrate foods on glucose levels  Identifies carbohydrate choices/grams/food labels  States benefits of physical activity and effect on glucose  Review of suggested activity guidelines  Handouts given during class include:  Type 2 Diabetes: Basics Book  My Food Plan Book  Food and Activity Log  Follow-Up Plan: Patient plans to attend Core 2 and Core 3 Classes within the next month

## 2012-06-14 NOTE — Patient Instructions (Signed)
Goals:  Follow Diabetes Meal Plan as instructed  Eat 3 meals and 2 snacks, every 3-5 hrs  Limit carbohydrate intake to 75 grams carbohydrate/meal  Limit carbohydrate intake to 30 grams carbohydrate/snack  Add lean protein foods to meals/snacks  Monitor glucose levels as instructed by your doctor  Aim for 15-30 mins of physical activity daily as tolerated  Bring food record and glucose log to your next nutrition visit

## 2012-07-04 ENCOUNTER — Encounter: Payer: Managed Care, Other (non HMO) | Admitting: *Deleted

## 2012-07-04 DIAGNOSIS — E119 Type 2 diabetes mellitus without complications: Secondary | ICD-10-CM

## 2012-07-04 NOTE — Progress Notes (Signed)
  Patient was seen on 07/04/12 for the second of a series of three diabetes self-management courses at the [facility]. The following learning objectives were met by the patient during this course:   Explain basic nutrition maintenance and quality assurance  Describe causes, symptoms and treatment of hypoglycemia and hyperglycemia  Explain how to manage diabetes during illness  Describe the importance of good nutrition for health and healthy eating strategies  List strategies to follow meal plan when dining out  Describe the effects of alcohol on glucose and how to use it safely  Describe problem solving skills for day-to-day glucose challenges  Describe strategies to use when treatment plan needs to change  Identify important factors involved in successful weight loss  Describe ways to remain physically active  Describe the impact of regular activity on insulin resistance   Handouts given in class:  Refrigerator magnet for Sick Day Guidelines  [facility] Oral medication/insulin handout  Follow-Up Plan: Patient will attend the final class of the ADA Diabetes Self-Care Education.

## 2012-07-12 ENCOUNTER — Ambulatory Visit (INDEPENDENT_AMBULATORY_CARE_PROVIDER_SITE_OTHER): Payer: Managed Care, Other (non HMO) | Admitting: Family Medicine

## 2012-07-12 ENCOUNTER — Encounter: Payer: Self-pay | Admitting: Family Medicine

## 2012-07-12 VITALS — BP 128/82 | HR 102 | Temp 99.0°F | Resp 18 | Wt 377.0 lb

## 2012-07-12 DIAGNOSIS — E1169 Type 2 diabetes mellitus with other specified complication: Secondary | ICD-10-CM | POA: Insufficient documentation

## 2012-07-12 DIAGNOSIS — R945 Abnormal results of liver function studies: Secondary | ICD-10-CM | POA: Insufficient documentation

## 2012-07-12 DIAGNOSIS — E1149 Type 2 diabetes mellitus with other diabetic neurological complication: Secondary | ICD-10-CM | POA: Insufficient documentation

## 2012-07-12 DIAGNOSIS — B379 Candidiasis, unspecified: Secondary | ICD-10-CM

## 2012-07-12 DIAGNOSIS — N529 Male erectile dysfunction, unspecified: Secondary | ICD-10-CM

## 2012-07-12 DIAGNOSIS — IMO0001 Reserved for inherently not codable concepts without codable children: Secondary | ICD-10-CM

## 2012-07-12 LAB — POCT URINALYSIS DIPSTICK
Glucose, UA: NEGATIVE
Ketones, UA: NEGATIVE
Leukocytes, UA: NEGATIVE
Protein, UA: NEGATIVE
Urobilinogen, UA: 0.2

## 2012-07-12 LAB — COMPREHENSIVE METABOLIC PANEL
BUN: 15 mg/dL (ref 6–23)
CO2: 26 mEq/L (ref 19–32)
Glucose, Bld: 118 mg/dL — ABNORMAL HIGH (ref 70–99)
Sodium: 143 mEq/L (ref 135–145)
Total Bilirubin: 0.4 mg/dL (ref 0.3–1.2)
Total Protein: 7.5 g/dL (ref 6.0–8.3)

## 2012-07-12 LAB — CBC WITH DIFFERENTIAL/PLATELET
Basophils Relative: 0 % (ref 0–1)
Eosinophils Absolute: 0.3 10*3/uL (ref 0.0–0.7)
Eosinophils Relative: 3 % (ref 0–5)
HCT: 44.3 % (ref 39.0–52.0)
Hemoglobin: 15 g/dL (ref 13.0–17.0)
Lymphs Abs: 2.8 10*3/uL (ref 0.7–4.0)
MCH: 30.4 pg (ref 26.0–34.0)
MCHC: 33.9 g/dL (ref 30.0–36.0)
MCV: 89.9 fL (ref 78.0–100.0)
Monocytes Absolute: 0.6 10*3/uL (ref 0.1–1.0)
Monocytes Relative: 5 % (ref 3–12)
Neutrophils Relative %: 66 % (ref 43–77)
RBC: 4.93 MIL/uL (ref 4.22–5.81)

## 2012-07-12 MED ORDER — GLIPIZIDE 5 MG PO TABS: ORAL_TABLET | ORAL | Status: DC

## 2012-07-12 MED ORDER — METFORMIN HCL 500 MG PO TABS: ORAL_TABLET | ORAL | Status: DC

## 2012-07-12 MED ORDER — SILDENAFIL CITRATE 100 MG PO TABS: 100.0000 mg | ORAL_TABLET | Freq: Every day | ORAL | Status: DC | PRN

## 2012-07-12 NOTE — Assessment & Plan Note (Signed)
Improved/resolved since last visit.

## 2012-07-12 NOTE — Progress Notes (Signed)
[address]   [place], [place]  [address]   [phone]  Subjective:    Patient ID: Miguel Sanders, male    DOB: 1976-01-04, 36 y.o.   MRN: [id number]  HPIThis 36 y.o. male presents for six week follow-up:  1.  DMII:  Six week follow-up of new onset DMII.  S/p two diabetic education classes; has learned a lot in classes.  Fasting sugars 67-130.  Random sugars 100-200.  Wants to start going to the gym but afraid of low sugars.  Will get shaky when skipping meals.  3-4 times of hypoglycemia.  Nocturia has resolved; blurred vision has resolved.  Compliance with Metformin 500mg  bid; suffered with diarrhea initially but has now resolved.  Compliance with Glipizide 5mg  bid with meals. Eating a shake for breakfast; eats lunch 50% of days; eats a large supper and snacks all evenings. Has started snacking on peanuts at night.  Worried about extra protein intake due to gout.  2.  Jock Itch: much improved and now resolved.  3. Elevated LFTs: found on labs at last visit. Denies abdominal pain, nausea, vomiting, diarrhea, jaundice. Due for repeat labs.  4. Erectile Dysfunction:  Onset six months ago; able to develop an erection but unable to maintain erection.    Review of Systems  Constitutional: Negative for fever, chills, diaphoresis and fatigue.  Eyes: Negative for visual disturbance.  Respiratory: Negative for shortness of breath.   Cardiovascular: Negative for chest pain, palpitations and leg swelling.  Gastrointestinal: Negative for nausea, vomiting, abdominal pain, diarrhea and constipation.  Genitourinary: Negative for discharge, penile swelling, scrotal swelling, genital sores, penile pain and testicular pain.  Skin: Negative for rash and wound.  Neurological: Negative for numbness.        Past Medical History  Diagnosis Date  . Gout   . Diabetes mellitus without complication   . Obesity, Class III, BMI 40-49.9 (morbid obesity)     Past Surgical History  Procedure Date  . Back  surgery   . Spine surgery     Prior to Admission medications   Medication Sig Start Date End Date Taking? Authorizing Provider  colchicine 0.6 MG tablet One pill three times daily PRN gouty attack 06/08/12  Yes Ethelda Chick, MD  glipiZIDE (GLUCOTROL) 5 MG tablet Take 1 tablet (5 mg total) by mouth 2 (two) times daily before a meal. 06/08/12  Yes Ethelda Chick, MD  ibuprofen (ADVIL,MOTRIN) 200 MG tablet Take 200 mg by mouth every 6 (six) hours as needed.     Yes Historical Provider, MD  ketoconazole (NIZORAL) 2 % cream Apply topically 2 (two) times daily. 06/08/12  Yes Ethelda Chick, MD  metFORMIN (GLUCOPHAGE) 500 MG tablet 2 tablets every morning and 1 every evening with meals. 07/12/12  Yes Ethelda Chick, MD  HYDROcodone-acetaminophen (NORCO) 5-325 MG per tablet Take 1 tablet by mouth every 6 (six) hours as needed.      Historical Provider, MD  sildenafil (VIAGRA) 100 MG tablet Take 1 tablet (100 mg total) by mouth daily as needed for erectile dysfunction. 07/12/12   Ethelda Chick, MD    No Known Allergies  History   Social History  . Marital Status: Married    Spouse Name: N/A    Number of Children: 2  . Years of Education: N/A   Occupational History  . unemployed    Social History Main Topics  . Smoking status: Former Smoker    Quit date: 08/09/2009  . Smokeless  tobacco: Never Used  . Alcohol Use: No  . Drug Use: No  . Sexually Active: Yes   Other Topics Concern  . Not on file   Social History Narrative   Marital status: married x 4 years.  Children: 2 children previous marriage  Lives: wife.    Employment: Conservator, museum/gallery.   Tobacco: quit; smoked x 13 years.   Alcohol: once every three months.   Drugs: none now; marijuana in past.   Exercise: none    Family History  Problem Relation Age of Onset  . Colon cancer Maternal Grandfather 32  . Cancer Other   . Hypertension Other   . Arthritis Mother     Objective:   Physical Exam  Nursing note and vitals  reviewed. Constitutional: He is oriented to person, place, and time. He appears well-developed and well-nourished. No distress.  HENT:  Head: Normocephalic and atraumatic.  Mouth/Throat: Oropharynx is clear and moist.  Eyes: Conjunctivae normal are normal. Pupils are equal, round, and reactive to light.  Neck: Normal range of motion. Neck supple. No JVD present. No thyromegaly present.  Cardiovascular: Normal rate, regular rhythm, normal heart sounds and intact distal pulses.  Exam reveals no gallop and no friction rub.   No murmur heard. Pulmonary/Chest: Effort normal and breath sounds normal. He has no wheezes. He has no rales.  Lymphadenopathy:    He has no cervical adenopathy.  Neurological: He is alert and oriented to person, place, and time. No cranial nerve deficit. He exhibits normal muscle tone. Coordination normal.  Skin: Skin is warm and dry. No rash noted. He is not diaphoretic.  Psychiatric: He has a normal mood and affect. His behavior is normal. Judgment and thought content normal.    Results for orders placed in visit on 07/12/12  GLUCOSE, POCT (MANUAL RESULT ENTRY)      Component Value Range   POC Glucose 124 (*) 70 - 99 mg/dl  POCT URINALYSIS DIPSTICK      Component Value Range   Color, UA yellow     Clarity, UA hazy     Glucose, UA neg     Bilirubin, UA neg     Ketones, UA neg     Spec Grav, UA 1.020     Blood, UA neg     pH, UA 5.5     Protein, UA neg     Urobilinogen, UA 0.2     Nitrite, UA neg     Leukocytes, UA Negative         Assessment & Plan:   1. Type II or unspecified type diabetes mellitus without mention of complication, uncontrolled  POCT glucose (manual entry), CBC with Differential, Comprehensive metabolic panel, POCT urinalysis dipstick, metFORMIN (GLUCOPHAGE) 500 MG tablet  2. ED (erectile dysfunction)    3. Liver function study, abnormal    4. Candidiasis     Meds ordered this encounter  Medications  . sildenafil (VIAGRA) 100 MG  tablet    Sig: Take 1 tablet (100 mg total) by mouth daily as needed for erectile dysfunction.    Dispense:  8 tablet    Refill:  5  . metFORMIN (GLUCOPHAGE) 500 MG tablet    Sig: 2 tablets every morning and 1 every evening with meals.    Dispense:  90 tablet    Refill:  2  . glipiZIDE (GLUCOTROL) 5 MG tablet    Sig: One tablet every evening with supper    Dispense:  60 tablet    Refill:  2    

## 2012-07-12 NOTE — Patient Instructions (Addendum)
1. Type II or unspecified type diabetes mellitus without mention of complication, uncontrolled  POCT glucose (manual entry), CBC with Differential, Comprehensive metabolic panel, POCT urinalysis dipstick, metFORMIN (GLUCOPHAGE) 500 MG tablet  2. ED (erectile dysfunction)    3. Liver function study, abnormal    4. Candidiasis       DECREASE GLIPIZIDE TO ONE TABLET WITH SUPPER AT NIGHT. INCREASE METFORMIN TO TWO TABLETS EVERY MORNING AND ONE AT NIGHT WITH SUPPER.

## 2012-07-12 NOTE — Assessment & Plan Note (Signed)
Persistent; recommend weight loss and exercise.  Medically cleared to start exercise regimen.

## 2012-07-12 NOTE — Assessment & Plan Note (Signed)
Improved with medications, dietary modification.  Obtain labs.  Will decrease Glipizide to 5mg  once every evening with supper.  Increase Metformin 500mg  to tid.  Encouraged to eat lunch daily. To attend third diabetic class in upcoming month.  Continue to check sugars bid for next two months.  Encouraged to start exercise program. Discussed hypoglycemia symptoms.

## 2012-07-12 NOTE — Assessment & Plan Note (Signed)
New.  Onset in past six months and likely associated with hyperglycemia, obesity.  RTC two months early morning appointment for testosterone and TSH.  Rx for Viagra provided and instructed on use and side effects.

## 2012-07-12 NOTE — Assessment & Plan Note (Signed)
New.  Repeat today; if persistent, will warrant abdominal u/s and referral to GI.

## 2012-07-15 NOTE — Progress Notes (Signed)
Reviewed and agree.

## 2012-07-18 ENCOUNTER — Ambulatory Visit: Payer: Managed Care, Other (non HMO) | Admitting: Dietician

## 2012-08-15 ENCOUNTER — Encounter: Payer: Managed Care, Other (non HMO) | Attending: Family Medicine | Admitting: Dietician

## 2012-08-15 DIAGNOSIS — Z713 Dietary counseling and surveillance: Secondary | ICD-10-CM | POA: Insufficient documentation

## 2012-08-15 DIAGNOSIS — IMO0001 Reserved for inherently not codable concepts without codable children: Secondary | ICD-10-CM

## 2012-08-15 DIAGNOSIS — E119 Type 2 diabetes mellitus without complications: Secondary | ICD-10-CM | POA: Insufficient documentation

## 2012-08-16 NOTE — Progress Notes (Signed)
  Patient was seen on 08/16/2011 for the third of a series of three diabetes self-management courses at the [facility]. The following learning objectives were met by the patient during this course:    Describe how diabetes changes over time   Identify diabetes complications and ways to prevent them   Describe strategies that can promote heart health including lowering blood pressure and cholesterol   Describe strategies to lower dietary fat and sodium in the diet   Identify physical activities that benefit cardiovascular health   Evaluate success in meeting personal goal   Describe the belief that they can live successfully with diabetes day to day   Establish 2-3 goals that they will plan to diligently work on until they return for the free 49-month follow-up visit  The following handouts were given in class:  3 Month Follow Up Visit handout  Goal setting handout  Class evaluation form  Your patient has established the following 3 month goals for diabetes self-care:  Get 3 meals a day.  Increase my activity at least 3 days per week.  Test my glucose at least 2 times a day 7 days a week.  Follow-Up Plan: Patient will attend a 3 month follow-up visit for diabetes self-management education.

## 2012-10-10 ENCOUNTER — Ambulatory Visit (INDEPENDENT_AMBULATORY_CARE_PROVIDER_SITE_OTHER): Payer: Managed Care, Other (non HMO)

## 2012-10-10 ENCOUNTER — Other Ambulatory Visit: Payer: Self-pay | Admitting: *Deleted

## 2012-10-10 ENCOUNTER — Encounter: Payer: Self-pay | Admitting: Family Medicine

## 2012-10-10 ENCOUNTER — Ambulatory Visit (INDEPENDENT_AMBULATORY_CARE_PROVIDER_SITE_OTHER): Payer: Managed Care, Other (non HMO) | Admitting: Family Medicine

## 2012-10-10 VITALS — BP 133/84 | HR 74 | Temp 97.8°F | Resp 18 | Ht 73.0 in | Wt 347.0 lb

## 2012-10-10 DIAGNOSIS — N529 Male erectile dysfunction, unspecified: Secondary | ICD-10-CM

## 2012-10-10 DIAGNOSIS — E119 Type 2 diabetes mellitus without complications: Secondary | ICD-10-CM

## 2012-10-10 DIAGNOSIS — IMO0001 Reserved for inherently not codable concepts without codable children: Secondary | ICD-10-CM

## 2012-10-10 DIAGNOSIS — R945 Abnormal results of liver function studies: Secondary | ICD-10-CM

## 2012-10-10 LAB — COMPREHENSIVE METABOLIC PANEL
ALT: 27 U/L (ref 0–53)
AST: 19 U/L (ref 0–37)
Alkaline Phosphatase: 82 U/L (ref 39–117)
Sodium: 140 mEq/L (ref 135–145)
Total Bilirubin: 0.4 mg/dL (ref 0.3–1.2)
Total Protein: 7.8 g/dL (ref 6.0–8.3)

## 2012-10-10 LAB — POCT URINALYSIS DIPSTICK
Leukocytes, UA: NEGATIVE
Nitrite, UA: NEGATIVE
Protein, UA: 100
pH, UA: 5.5

## 2012-10-10 LAB — CBC
MCH: 29 pg (ref 26.0–34.0)
MCHC: 34.7 g/dL (ref 30.0–36.0)
Platelets: 178 10*3/uL (ref 150–400)
RDW: 13.6 % (ref 11.5–15.5)

## 2012-10-10 LAB — LIPID PANEL
LDL Cholesterol: 79 mg/dL (ref 0–99)
Triglycerides: 162 mg/dL — ABNORMAL HIGH (ref <150)
VLDL: 32 mg/dL (ref 0–40)

## 2012-10-10 LAB — TSH: TSH: 2.42 u[IU]/mL (ref 0.350–4.500)

## 2012-10-10 MED ORDER — METFORMIN HCL 500 MG PO TABS: ORAL_TABLET | ORAL | Status: DC

## 2012-10-10 MED ORDER — METFORMIN HCL 500 MG PO TABS
ORAL_TABLET | ORAL | Status: DC
Start: 2012-10-10 — End: 2012-10-10

## 2012-10-10 NOTE — Progress Notes (Signed)
[name]   [place], [place]  [address]   [phone]  Subjective:    Patient ID: Miguel Sanders, male    DOB: October 12, 1975, 37 y.o.   MRN: [id number]  HPI This 37 y.o. male presents for three month follow-up for the following:  1.  DMII: three month follow-up; weight down 30 pounds since last visit.  Fasting sugars running 85-102.  Post-prandial sugars running 120-125.  Completed third diabetic class since last visit.  Checking sugars twice daily currently.  Was having low sugars.  Stopped Glipizide one month ago due to low sugars.  Blood sugars dropping three days in a row; sugars in 30-40s.  Exercising regularly; trying to go 4-5 days per week; cardio/elliptical for 30 minutes; Also riding stationary bike; lifting weights at home.  Losing weight but now plateauing.  B:  1-2 Bananas; no protein.  Lunch:  Eating out normal; yesterday ate hamburger, fries, diet coke.   Snack: none  Supper: salad with chicken, pepperonis.    Increased Metformin to three tablets daily; no GI side effects.  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  2. ED:  Did not get Viagra filled; separated from wife; no need to fill Viagra.  Not currently sexually active.  3. LFT elevated: improved last visit.     Review of Systems  Constitutional: Negative for fever, chills, diaphoresis and fatigue.  Respiratory: Negative for cough and shortness of breath.   Cardiovascular: Negative for chest pain, palpitations and leg swelling.  Endocrine: Negative for cold intolerance, heat intolerance, polydipsia, polyphagia and polyuria.  Genitourinary: Negative for frequency.  Skin: Negative for color change, pallor, rash and wound.  Neurological: Positive for numbness. Negative for dizziness, tremors, seizures, syncope, facial asymmetry, speech difficulty, weakness, light-headedness and headaches.  Psychiatric/Behavioral:  Negative for sleep disturbance and dysphoric mood. The patient is not nervous/anxious.         Past Medical History  Diagnosis Date  . Gout   . Diabetes mellitus without complication   . Obesity, Class III, BMI 40-49.9 (morbid obesity)     Past Surgical History  Procedure Laterality Date  . Back surgery    . Spine surgery      Prior to Admission medications   Medication Sig Start Date End Date Taking? Authorizing Provider  colchicine 0.6 MG tablet One pill three times daily PRN gouty attack 06/08/12  Yes Ethelda Chick, MD  ibuprofen (ADVIL,MOTRIN) 200 MG tablet Take 200 mg by mouth every 6 (six) hours as needed.     Yes Historical Provider, MD  glipiZIDE (GLUCOTROL) 5 MG tablet One tablet every evening with supper 07/12/12   Ethelda Chick, MD  metFORMIN (GLUCOPHAGE) 500 MG tablet 2 tablets every morning and 1 every evening with meals. 10/10/12   Ethelda Chick, MD  sildenafil (VIAGRA) 100 MG tablet Take 1 tablet (100 mg total) by mouth daily as needed for erectile dysfunction. 07/12/12   Ethelda Chick, MD    No Known Allergies  History   Social History  . Marital Status: Married    Spouse Name: N/A    Number of Children: 2  . Years of Education: N/A   Occupational History  . unemployed    Social History Main Topics  . Smoking status: Former Smoker    Quit date: 08/09/2009  . Smokeless tobacco: Never Used  . Alcohol Use: No  .  Drug Use: No  . Sexually Active: Yes   Other Topics Concern  . Not on file   Social History Narrative   Marital status: married x 4 years.     Children: 2 children previous marriage     Lives: wife.       Employment: Conservator, museum/gallery.      Tobacco: quit; smoked x 13 years.      Alcohol: once every three months.      Drugs: none now; marijuana in past.      Exercise: none          Family History  Problem Relation Age of Onset  . Colon cancer Maternal Grandfather 63  . Cancer Other   . Hypertension Other   . Arthritis Mother      Objective:   Physical Exam  Vitals reviewed. Constitutional: He is oriented to person, place, and time. He appears well-developed and well-nourished. No distress.  HENT:  Mouth/Throat: Oropharynx is clear and moist.  Eyes: Conjunctivae and EOM are normal. Pupils are equal, round, and reactive to light.  Neck: Normal range of motion. Neck supple. No thyromegaly present.  Cardiovascular: Normal rate, regular rhythm, normal heart sounds and intact distal pulses.  Exam reveals no gallop and no friction rub.   No murmur heard. Pulmonary/Chest: Effort normal and breath sounds normal. He has no wheezes. He has no rales.  Lymphadenopathy:    He has no cervical adenopathy.  Neurological: He is alert and oriented to person, place, and time. No cranial nerve deficit. He exhibits normal muscle tone. Coordination normal.  Skin: Skin is warm and dry. No rash noted. He is not diaphoretic. No erythema. No pallor.  Psychiatric: He has a normal mood and affect. His behavior is normal. Judgment and thought content normal.       Results for orders placed in visit on 10/10/12  POCT URINALYSIS DIPSTICK      Result Value Range   Color, UA yellow     Clarity, UA slightly cloudy     Glucose, UA neg     Bilirubin, UA neg     Ketones, UA neg     Spec Grav, UA >=1.030     Blood, UA neg     pH, UA 5.5     Protein, UA 100     Urobilinogen, UA 0.2     Nitrite, UA neg     Leukocytes, UA Negative    POCT GLYCOSYLATED HEMOGLOBIN (HGB A1C)      Result Value Range   Hemoglobin A1C 5.1      Assessment & Plan:  Type II or unspecified type diabetes mellitus without mention of complication, not stated as uncontrolled - Plan: POCT urinalysis dipstick, POCT glycosylated hemoglobin (Hb A1C), CBC, Comprehensive metabolic panel, Lipid panel, Ambulatory referral to Ophthalmology, DISCONTINUED: metFORMIN (GLUCOPHAGE) 500 MG tablet  Erectile dysfunction - Plan: TSH, Testosterone  Obesity, Class III, BMI 40-49.9  (morbid obesity)  Type II or unspecified type diabetes mellitus without mention of complication, uncontrolled - Plan: DISCONTINUED: metFORMIN (GLUCOPHAGE) 500 MG tablet  ED (erectile dysfunction)  Liver function study, abnormal    Meds ordered this encounter  Medications  . DISCONTD: metFORMIN (GLUCOPHAGE) 500 MG tablet    Sig: 2 tablets every morning and 1 every evening with meals.    Dispense:  90 tablet    Refill:  5

## 2012-10-10 NOTE — Assessment & Plan Note (Signed)
Improved/resolved; repeat today.

## 2012-10-10 NOTE — Patient Instructions (Addendum)
Type II or unspecified type diabetes mellitus without mention of complication, not stated as uncontrolled - Plan: POCT urinalysis dipstick, POCT glycosylated hemoglobin (Hb A1C), CBC, Comprehensive metabolic panel, Lipid panel, Ambulatory referral to Ophthalmology, metFORMIN (GLUCOPHAGE) 500 MG tablet  Erectile dysfunction - Plan: TSH, Testosterone  Obesity, Class III, BMI 40-49.9 (morbid obesity)  Type II or unspecified type diabetes mellitus without mention of complication, uncontrolled - Plan: metFORMIN (GLUCOPHAGE) 500 MG tablet  ED (erectile dysfunction)  Liver function study, abnormal

## 2012-10-10 NOTE — Assessment & Plan Note (Signed)
Improved/controlled with HgbA1c of 5.1.  Refill of Metformin 500mg  tid; pt desires to continue current dose of Metformin but can decrease at follow-up visit if HgbA1c remains normal.  D/c Glucotrol.  Refer for diabetic eye exam.  Congratulations on 30 pound weight loss.

## 2012-10-10 NOTE — Assessment & Plan Note (Signed)
Improving; weight down 30 pounds in three months; pt requesting appetite suppressant but encourage continued dietary modification.  Consider appetite suppressant if struggles with weight loss at follow-up visits.

## 2012-10-10 NOTE — Assessment & Plan Note (Signed)
Persistent; has not filled Viagra due to separation from wife; obtain TSH and testosterone levels.

## 2012-11-13 ENCOUNTER — Encounter: Payer: Managed Care, Other (non HMO) | Attending: Family Medicine | Admitting: Dietician

## 2012-11-13 DIAGNOSIS — E119 Type 2 diabetes mellitus without complications: Secondary | ICD-10-CM | POA: Insufficient documentation

## 2012-11-13 DIAGNOSIS — Z713 Dietary counseling and surveillance: Secondary | ICD-10-CM | POA: Insufficient documentation

## 2012-11-13 NOTE — Progress Notes (Signed)
  Patient was seen on 11/13/2012 for their 3 month follow-up as a part of the diabetes self-management courses at the [facility]. The following learning objectives were met by your patient during this course:  Ht:62 in  WT: 336.7 lb  (loss of 14.4 lb since Core Class 1)  A1C: 5.1% (10/10/2012) down from 10.9 (11/31/2013)   Patient self reports the following:  Diabetes control has improved since diabetes self-management training: yes Number of days blood glucose is >200: none Last MD appointment for diabetes: March 4th 2014 Changes in treatment plan: Yes, stopped the sulfonylureas medication.  To take Metformin until my June appointment Confidence with ability to manage diabetes: Yes Areas for improvement with diabetes self-care: Continue to lose weight., and decrease diabetes medications. Willingness to participate in diabetes support group: not really    Please see Diabetes Flow sheet for findings related to patient's self-care.  Follow-Up Plan: Patient is eligible for a "free" 30 minute diabetes self-care appointment in the next year. Patient to call and schedule as needed.

## 2013-01-16 ENCOUNTER — Encounter: Payer: Self-pay | Admitting: Family Medicine

## 2013-01-16 ENCOUNTER — Ambulatory Visit (INDEPENDENT_AMBULATORY_CARE_PROVIDER_SITE_OTHER): Payer: Managed Care, Other (non HMO) | Admitting: Family Medicine

## 2013-01-16 VITALS — BP 124/76 | HR 66 | Temp 98.7°F | Resp 16 | Ht 71.0 in | Wt 314.6 lb

## 2013-01-16 DIAGNOSIS — N529 Male erectile dysfunction, unspecified: Secondary | ICD-10-CM

## 2013-01-16 DIAGNOSIS — E291 Testicular hypofunction: Secondary | ICD-10-CM

## 2013-01-16 DIAGNOSIS — R5381 Other malaise: Secondary | ICD-10-CM

## 2013-01-16 DIAGNOSIS — IMO0001 Reserved for inherently not codable concepts without codable children: Secondary | ICD-10-CM

## 2013-01-16 DIAGNOSIS — G4700 Insomnia, unspecified: Secondary | ICD-10-CM

## 2013-01-16 DIAGNOSIS — R5382 Chronic fatigue, unspecified: Secondary | ICD-10-CM | POA: Insufficient documentation

## 2013-01-16 DIAGNOSIS — R5383 Other fatigue: Secondary | ICD-10-CM

## 2013-01-16 DIAGNOSIS — E119 Type 2 diabetes mellitus without complications: Secondary | ICD-10-CM

## 2013-01-16 LAB — CBC WITH DIFFERENTIAL/PLATELET
Basophils Absolute: 0 10*3/uL (ref 0.0–0.1)
Basophils Relative: 0 % (ref 0–1)
Eosinophils Relative: 4 % (ref 0–5)
HCT: 44.6 % (ref 39.0–52.0)
MCHC: 34.5 g/dL (ref 30.0–36.0)
Monocytes Absolute: 0.4 10*3/uL (ref 0.1–1.0)
Neutro Abs: 2.9 10*3/uL (ref 1.7–7.7)
Platelets: 189 10*3/uL (ref 150–400)
RDW: 14.4 % (ref 11.5–15.5)

## 2013-01-16 LAB — COMPREHENSIVE METABOLIC PANEL
AST: 17 U/L (ref 0–37)
Alkaline Phosphatase: 67 U/L (ref 39–117)
BUN: 13 mg/dL (ref 6–23)
Creat: 0.84 mg/dL (ref 0.50–1.35)

## 2013-01-16 LAB — POCT GLYCOSYLATED HEMOGLOBIN (HGB A1C): Hemoglobin A1C: 4.9

## 2013-01-16 MED ORDER — TRAZODONE HCL 50 MG PO TABS: 50.0000 mg | ORAL_TABLET | Freq: Every evening | ORAL | Status: DC | PRN

## 2013-01-16 NOTE — Progress Notes (Signed)
[address]   [place], [place]  [address]   [phone]  Subjective:    Patient ID: Miguel Sanders, male    DOB: 1975/10/19, 37 y.o.   MRN: [id number]  HPI This 37 y.o. male presents for three month follow-up for:  1.  DMII: three month follow-up; stopped Metformin one month after last visits.  Has really cut out sweets; checking sugars once daily; fasting; 70s-110s.  Rare sugars > 120.  No random sugars.  Exercising five times per week; M-F.  Cardio elliptical and bike.  Now also doing weights.  Nocturia x 0-1.  Weight down 22 pounds since last visit three months ago.  2.  Hyperlipidemia:  Weight down 22 pounds from last visit.    3.  Hypogonadism:  Does suffer with fatigue; getting up at 4:30am to exercise; no energy to exercise in evenings.  Has been researching Testosterone replacement benefits and risks.  Viagra working well but continues to suffer with ED.    4. Fatigue:  No previous sleep study.  Mind races at night; can race for two hours.  No caffeine in evenings.  Tried Melatonin in past without improvement.  Falls asleep 10:00; wakes up at 4:30 to exercise.  Must be at work at 8:00.    5.  Gout:  Decreased exacerbations lately; no longer taking Allopurinol daily.  Having one gouty attack per month on average which is much improved.     Review of Systems  Constitutional: Positive for fatigue. Negative for fever, chills, diaphoresis and unexpected weight change.  Respiratory: Negative for cough, shortness of breath, wheezing and stridor.   Cardiovascular: Negative for chest pain, palpitations and leg swelling.  Endocrine: Negative for cold intolerance, heat intolerance, polydipsia, polyphagia and polyuria.  Musculoskeletal: Positive for arthralgias.  Skin: Negative for color change, pallor, rash and wound.  Neurological: Negative for dizziness, tremors, seizures, syncope, facial asymmetry, speech difficulty, weakness, light-headedness, numbness and headaches.    Psychiatric/Behavioral: Positive for sleep disturbance. Negative for dysphoric mood. The patient is not nervous/anxious.     Past Medical History  Diagnosis Date  . Gout   . Diabetes mellitus without complication   . Obesity, Class III, BMI 40-49.9 (morbid obesity)     Past Surgical History  Procedure Laterality Date  . Back surgery    . Spine surgery      Prior to Admission medications   Medication Sig Start Date End Date Taking? Authorizing Provider  ibuprofen (ADVIL,MOTRIN) 200 MG tablet Take 200 mg by mouth every 6 (six) hours as needed.     Yes Historical Provider, MD  colchicine 0.6 MG tablet One pill three times daily PRN gouty attack 06/08/12   Ethelda Chick, MD  glipiZIDE (GLUCOTROL) 5 MG tablet One tablet every evening with supper 07/12/12   Ethelda Chick, MD  metFORMIN (GLUCOPHAGE) 500 MG tablet 2 tablets every morning and 1 every evening with meals. 10/10/12   Ethelda Chick, MD  sildenafil (VIAGRA) 100 MG tablet Take 1 tablet (100 mg total) by mouth daily as needed for erectile dysfunction. 07/12/12   Ethelda Chick, MD  traZODone (DESYREL) 50 MG tablet Take 1-2 tablets (50-100 mg total) by mouth at bedtime as needed for sleep. 01/16/13   Ethelda Chick, MD    No Known Allergies  History   Social History  . Marital Status: Married    Spouse Name: N/A    Number of Children: 2  . Years of Education: N/A  Occupational History  . unemployed    Social History Main Topics  . Smoking status: Former Smoker    Quit date: 08/09/2009  . Smokeless tobacco: Never Used  . Alcohol Use: No  . Drug Use: No  . Sexually Active: Yes   Other Topics Concern  . Not on file   Social History Narrative   Marital status: married x 5 years. Separated from wife in 08/2012.     Children: 2 children from previous marriage; gets children every two weeks; sees more frequently.     Lives: alone; separated from wife in 08/2012.     Employment: Conservator, museum/gallery.      Tobacco: quit;  smoked x 13 years.      Alcohol: once every three months.      Drugs: none now; marijuana in past.      Exercise: started going to gym in 08/2012; going 4-5 days per week; elliptical and stationary bike.             Family History  Problem Relation Age of Onset  . Colon cancer Maternal Grandfather 34  . Cancer Other   . Hypertension Other   . Arthritis Mother        Objective:   Physical Exam  Nursing note and vitals reviewed. Constitutional: He is oriented to person, place, and time. He appears well-developed and well-nourished. No distress.  HENT:  Mouth/Throat: Oropharynx is clear and moist.  Eyes: Conjunctivae and EOM are normal. Pupils are equal, round, and reactive to light.  Neck: Normal range of motion. Neck supple.  Cardiovascular: Normal rate, regular rhythm, normal heart sounds and intact distal pulses.  Exam reveals no gallop and no friction rub.   No murmur heard. Pulmonary/Chest: Effort normal and breath sounds normal. He has no wheezes. He has no rales.  Neurological: He is alert and oriented to person, place, and time. No cranial nerve deficit. He exhibits normal muscle tone. Coordination normal.  Skin: Skin is warm and dry. No rash noted. He is not diaphoretic. No erythema.  Psychiatric: He has a normal mood and affect. His behavior is normal.   Results for orders placed in visit on 01/16/13  POCT GLYCOSYLATED HEMOGLOBIN (HGB A1C)      Result Value Range   Hemoglobin A1C 4.9         Assessment & Plan:  Type II or unspecified type diabetes mellitus without mention of complication, not stated as uncontrolled - Plan: POCT glycosylated hemoglobin (Hb A1C), CBC with Differential, Comprehensive metabolic panel  Hypogonadism male - Plan: Testosterone  Other malaise and fatigue  Insomnia - Plan: traZODone (DESYREL) 50 MG tablet  Obesity, Class III, BMI 40-49.9 (morbid obesity)  ED (erectile dysfunction)  Type II or unspecified type diabetes mellitus  without mention of complication, uncontrolled    1.  DMII: controlled with weight loss and dietary modification; d/c Metformin.  Continue with aggressive attempts at weight loss, exercise.  S/p diabetic education classes. 2.  Hypogonadism:  New.  Associated with fatigue, ED.  Repeat today.  Discussed treatment options; pt to consider referral to urology in future. 3. Malaise and Fatigue:  Persistent; hypogonadism may be contributing; OSA also likely; suffering with insomnia; will treat insomnia and reassess. 4.  Insomnia: new. Rx for Trazodone provided.  Goal of 7-8 hours of sleep per night. 5.  Obesity: improving; weight down 100 pounds in past nine months.  Has lost 22 pounds in past three months. 6.  ED: persistent; normal TSH; abnormal testosterone;  repeat testosterone level; Viagra working well.  Meds ordered this encounter  Medications  . traZODone (DESYREL) 50 MG tablet    Sig: Take 1-2 tablets (50-100 mg total) by mouth at bedtime as needed for sleep.    Dispense:  60 tablet    Refill:  3

## 2013-04-30 ENCOUNTER — Ambulatory Visit: Payer: Managed Care, Other (non HMO) | Admitting: Family Medicine

## 2015-03-31 ENCOUNTER — Ambulatory Visit (INDEPENDENT_AMBULATORY_CARE_PROVIDER_SITE_OTHER): Payer: BLUE CROSS/BLUE SHIELD | Admitting: Physician Assistant

## 2015-03-31 VITALS — BP 102/78 | HR 71 | Temp 98.6°F | Resp 16 | Ht 73.0 in | Wt 373.4 lb

## 2015-03-31 DIAGNOSIS — K648 Other hemorrhoids: Secondary | ICD-10-CM

## 2015-03-31 DIAGNOSIS — K645 Perianal venous thrombosis: Secondary | ICD-10-CM | POA: Diagnosis not present

## 2015-03-31 DIAGNOSIS — K644 Residual hemorrhoidal skin tags: Secondary | ICD-10-CM | POA: Insufficient documentation

## 2015-03-31 MED ORDER — NITROGLYCERIN 0.4 % RE OINT: 1.0000 "application " | TOPICAL_OINTMENT | Freq: Two times a day (BID) | RECTAL | Status: DC

## 2015-03-31 MED ORDER — BENZOCAINE (TOPICAL) 10 % EX OINT: 1.0000 | TOPICAL_OINTMENT | Freq: Four times a day (QID) | CUTANEOUS | Status: DC | PRN

## 2015-03-31 NOTE — Patient Instructions (Addendum)
We opened up your thrombosed external hemorrhoid today.  Please apply the nitroglycerin topical twice daily for one week.  Do not take your erectile dysfunction medicine while you are using the nitroglycerin topical!! Please apply the benzocaine topical up to four times daily for pain over the next few days.  To prevent in the future, eating a high fiber diet, drinking lots of water, stopping lingering on the toilet will all help. Please come back to see [place] if you're not better in one week.   Hemorrhoids Hemorrhoids are swollen veins around the rectum or anus. There are two types of hemorrhoids:   Internal hemorrhoids. These occur in the veins just inside the rectum. They may poke through to the outside and become irritated and painful.  External hemorrhoids. These occur in the veins outside the anus and can be felt as a painful swelling or hard lump near the anus. CAUSES  Pregnancy.   Obesity.   Constipation or diarrhea.   Straining to have a bowel movement.   Sitting for long periods on the toilet.  Heavy lifting or other activity that caused you to strain.  Anal intercourse. SYMPTOMS   Pain.   Anal itching or irritation.   Rectal bleeding.   Fecal leakage.   Anal swelling.   One or more lumps around the anus.  DIAGNOSIS  Your caregiver may be able to diagnose hemorrhoids by visual examination. Other examinations or tests that may be performed include:   Examination of the rectal area with a gloved hand (digital rectal exam).   Examination of anal canal using a small tube (scope).   A blood test if you have lost a significant amount of blood.  A test to look inside the colon (sigmoidoscopy or colonoscopy). TREATMENT Most hemorrhoids can be treated at home. However, if symptoms do not seem to be getting better or if you have a lot of rectal bleeding, your caregiver may perform a procedure to help make the hemorrhoids get smaller or remove them  completely. Possible treatments include:   Placing a rubber band at the base of the hemorrhoid to cut off the circulation (rubber band ligation).   Injecting a chemical to shrink the hemorrhoid (sclerotherapy).   Using a tool to burn the hemorrhoid (infrared light therapy).   Surgically removing the hemorrhoid (hemorrhoidectomy).   Stapling the hemorrhoid to block blood flow to the tissue (hemorrhoid stapling).  HOME CARE INSTRUCTIONS   Eat foods with fiber, such as whole grains, beans, nuts, fruits, and vegetables. Ask your doctor about taking products with added fiber in them (fibersupplements).  Increase fluid intake. Drink enough water and fluids to keep your urine clear or pale yellow.   Exercise regularly.   Go to the bathroom when you have the urge to have a bowel movement. Do not wait.   Avoid straining to have bowel movements.   Keep the anal area dry and clean. Use wet toilet paper or moist towelettes after a bowel movement.   Medicated creams and suppositories may be used or applied as directed.   Only take over-the-counter or prescription medicines as directed by your caregiver.   Take warm sitz baths for 15-20 minutes, 3-4 times a day to ease pain and discomfort.   Place ice packs on the hemorrhoids if they are tender and swollen. Using ice packs between sitz baths may be helpful.   Put ice in a plastic bag.   Place a towel between your skin and the bag.   Leave  the ice on for 15-20 minutes, 3-4 times a day.   Do not use a donut-shaped pillow or sit on the toilet for long periods. This increases blood pooling and pain.  SEEK MEDICAL CARE IF:  You have increasing pain and swelling that is not controlled by treatment or medicine.  You have uncontrolled bleeding.  You have difficulty or you are unable to have a bowel movement.  You have pain or inflammation outside the area of the hemorrhoids. MAKE SURE YOU:  Understand these  instructions.  Will watch your condition.  Will get help right away if you are not doing well or get worse. Document Released: 07/23/2000 Document Revised: 07/12/2012 Document Reviewed: 05/30/2012 [facility] Patient Information 2015 [place], [place]. This information is not intended to replace advice given to you by your health care provider. Make sure you discuss any questions you have with your health care provider.

## 2015-03-31 NOTE — Progress Notes (Signed)
Hemorrhoid cleansed with alcohol swab/betadine. 2cc 1% lido plain local anesthesia around hemorrhoid. 89mm linear incision over center of hemorrhoid. Clot removed.

## 2015-03-31 NOTE — Progress Notes (Signed)
   Subjective:    Patient ID: Miguel Sanders, male    DOB: 1975/10/23, 39 y.o.   MRN: [id number]  Chief Complaint  Patient presents with  . Hemorrhoids    first noticed wed.   Medications, allergies, past medical history, surgical history, family history, social history and problem list reviewed and updated.  HPI  23 yom presents with hemorrhoids.   First noticed pain approx 4 days ago. Felt swelling when touched anal area. Mild pain with BMs. Denies itching. Denies fevers, chills. Blood in underwear past 2 days. Thinks has had hemorrhoids prior but never been diagnosed. Denies abd pain. Hx constipation but none recently.   Review of Systems See HPI     Objective:   Physical Exam  Constitutional:  BP 102/78 mmHg  Pulse 71  Temp(Src) 98.6 F (37 C) (Oral)  Resp 16  Ht 6\' 1"  (1.854 m)  Wt 373 lb 6.4 oz (169.373 kg)  BMI 49.27 kg/m2  SpO2 98%   Abdominal: Soft. Normal appearance and bowel sounds are normal. There is no tenderness.  Genitourinary: Rectal exam shows external hemorrhoid. Rectal exam shows no fissure.  Thrombosed external hemorrhoid. Two small bleeding ulcerations from hemorrhoid. No surrounding erythema.   Psychiatric: He has a normal mood and affect. His speech is normal and behavior is normal.      Assessment & Plan:   External thrombosed hemorrhoids - Plan: Nitroglycerin 0.4 % OINT, BENZOCAINE, TOPICAL, 10 % OINT  External hemorrhoid --Excision per procedure note --ntg, benzocaine topicals --high fiber diet, increase water intake, prn stool softeners, toileting habits reviewed -rtc if no relief one week  Julieta Gutting, PA-C Physician Assistant-Certified [facility]  03/31/2015 11:13 AM

## 2016-10-24 DIAGNOSIS — M109 Gout, unspecified: Secondary | ICD-10-CM | POA: Diagnosis not present

## 2017-04-25 DIAGNOSIS — R3129 Other microscopic hematuria: Secondary | ICD-10-CM | POA: Diagnosis not present

## 2017-04-25 DIAGNOSIS — M545 Low back pain: Secondary | ICD-10-CM | POA: Diagnosis not present

## 2017-06-01 ENCOUNTER — Ambulatory Visit (INDEPENDENT_AMBULATORY_CARE_PROVIDER_SITE_OTHER): Payer: 59 | Admitting: Family Medicine

## 2017-06-01 ENCOUNTER — Encounter: Payer: Self-pay | Admitting: Family Medicine

## 2017-06-01 VITALS — BP 120/74 | HR 103 | Temp 98.3°F | Resp 12 | Ht 73.0 in | Wt >= 6400 oz

## 2017-06-01 DIAGNOSIS — K219 Gastro-esophageal reflux disease without esophagitis: Secondary | ICD-10-CM

## 2017-06-01 DIAGNOSIS — Z6841 Body Mass Index (BMI) 40.0 and over, adult: Secondary | ICD-10-CM | POA: Diagnosis not present

## 2017-06-01 DIAGNOSIS — E1149 Type 2 diabetes mellitus with other diabetic neurological complication: Secondary | ICD-10-CM | POA: Diagnosis not present

## 2017-06-01 DIAGNOSIS — R5382 Chronic fatigue, unspecified: Secondary | ICD-10-CM | POA: Diagnosis not present

## 2017-06-01 DIAGNOSIS — R252 Cramp and spasm: Secondary | ICD-10-CM | POA: Diagnosis not present

## 2017-06-01 LAB — CBC
HCT: 45.3 % (ref 39.0–52.0)
Hemoglobin: 15.3 g/dL (ref 13.0–17.0)
MCHC: 33.8 g/dL (ref 30.0–36.0)
MCV: 90.4 fl (ref 78.0–100.0)
Platelets: 186 10*3/uL (ref 150.0–400.0)
RBC: 5.01 Mil/uL (ref 4.22–5.81)
RDW: 12.9 % (ref 11.5–15.5)
WBC: 6.1 10*3/uL (ref 4.0–10.5)

## 2017-06-01 LAB — COMPREHENSIVE METABOLIC PANEL
ALBUMIN: 4 g/dL (ref 3.5–5.2)
ALT: 51 U/L (ref 0–53)
AST: 39 U/L — ABNORMAL HIGH (ref 0–37)
Alkaline Phosphatase: 120 U/L — ABNORMAL HIGH (ref 39–117)
BILIRUBIN TOTAL: 0.5 mg/dL (ref 0.2–1.2)
BUN: 14 mg/dL (ref 6–23)
CALCIUM: 9.5 mg/dL (ref 8.4–10.5)
CHLORIDE: 98 meq/L (ref 96–112)
CO2: 26 mEq/L (ref 19–32)
CREATININE: 1.12 mg/dL (ref 0.40–1.50)
GFR: 77.11 mL/min (ref >60.00)
Glucose, Bld: 392 mg/dL — ABNORMAL HIGH (ref 70–99)
Potassium: 4.2 mEq/L (ref 3.5–5.1)
SODIUM: 135 meq/L (ref 135–145)
TOTAL PROTEIN: 7.4 g/dL (ref 6.0–8.3)

## 2017-06-01 LAB — TSH: TSH: 1.05 u[IU]/mL (ref 0.35–4.50)

## 2017-06-01 LAB — GLUCOSE, POCT (MANUAL RESULT ENTRY): POC GLUCOSE: 394 mg/dL — AB (ref 70–99)

## 2017-06-01 LAB — MICROALBUMIN / CREATININE URINE RATIO
Creatinine,U: 92.5 mg/dL
MICROALB UR: 33.2 mg/dL — AB (ref 0.0–1.9)
Microalb Creat Ratio: 35.9 mg/g — ABNORMAL HIGH (ref 0.0–30.0)

## 2017-06-01 LAB — POCT GLYCOSYLATED HEMOGLOBIN (HGB A1C): Hemoglobin A1C: 9.8

## 2017-06-01 LAB — CK: Total CK: 281 U/L — ABNORMAL HIGH (ref 7–232)

## 2017-06-01 MED ORDER — LIRAGLUTIDE 18 MG/3ML ~~LOC~~ SOPN: 1.2000 mg | PEN_INJECTOR | Freq: Every day | SUBCUTANEOUS | 2 refills | Status: DC

## 2017-06-01 MED ORDER — METFORMIN HCL 1000 MG PO TABS: 1000.0000 mg | ORAL_TABLET | Freq: Two times a day (BID) | ORAL | 3 refills | Status: DC

## 2017-06-01 NOTE — Progress Notes (Signed)
HPI:   [name] is a 41 y.o. male, who is here today with his wife to establish care.  Former PCP: N/A Last preventive routine visit: Over a year ago.  Chronic medical problems: DM II,insomnia,obesity,fatigue,and gout among some.   Diabetes Mellitus II:   Dx in 2012. Currently on not treatment. At some point he was able to manage his DM with dietary changes.  Checking BS's : not checking Hypoglycemia:Deneis  2 years of polydipsia, polyuria, polyphagia, and nocturia.  He does not remember having a eye exam. + Tingling and numbness left foot, mainly at night.  He is not exercising regularly, he doesn't follow a healthy diet.Marland Kitchen He drinks 2-4 L of diet soda daily, sweet tea, cookies, potato chips, and pastries among some.   Lab Results  Component Value Date   CREATININE 0.84 01/16/2013   BUN 13 01/16/2013   NA 141 01/16/2013   K 4.1 01/16/2013   CL 104 01/16/2013   CO2 25 01/16/2013    Lab Results  Component Value Date   HGBA1C 4.9 01/16/2013   Denies dysuria,gross hematuria,or decreased urine output. A month ago left lower back, resolved. Urine dip "little bit" of blood.   Nausea for a couple times per week, regurgitation.  Exacerbated with food intake and frequently at night.  Cramp on side of abdominal wall and LE, for a while and worse for the past few days.  Denies abdominal pain, nausea, vomiting, changes in bowel habits, blood in stool or melena.   Fatigue for at least for 6 months,constant. He is not aware of sleep apnea. Through the night he wakes up frequently due to urge to urinate, to drink water, GERD symptoms, or because shoulder aching when lying on his side.  Wife is concerned about depression, which he denies. Body aches, no motivation, and low self esteem.   Denies suicidal thoughts.   He is asking for something to take for energy.    Review of Systems  Constitutional: Positive for fatigue. Negative for activity  change, appetite change, fever and unexpected weight change.  HENT: Negative for mouth sores, nosebleeds, sore throat and trouble swallowing.   Eyes: Negative for redness and visual disturbance.  Respiratory: Negative for apnea, cough, shortness of breath and wheezing.   Cardiovascular: Negative for chest pain, palpitations and leg swelling.  Gastrointestinal: Positive for nausea. Negative for abdominal pain, blood in stool and vomiting.       No changes in bowel habits.  Endocrine: Positive for polydipsia, polyphagia and polyuria. Negative for cold intolerance and heat intolerance.  Genitourinary: Negative for decreased urine volume, dysuria and hematuria.  Musculoskeletal: Positive for arthralgias and myalgias. Negative for gait problem.  Skin: Negative for rash and wound.  Neurological: Positive for numbness. Negative for dizziness, seizures, weakness and headaches.  Hematological: Negative for adenopathy. Does not bruise/bleed easily.  Psychiatric/Behavioral: Positive for sleep disturbance. Negative for confusion and suicidal ideas. The patient is nervous/anxious.      No current outpatient prescriptions on file prior to visit.   No current facility-administered medications on file prior to visit.      Past Medical History:  Diagnosis Date  . Diabetes mellitus without complication ([place])   . Gout   . Obesity, Class III, BMI 40-49.9 (morbid obesity) (HCC)    No Known Allergies  Family History  Problem Relation Age of Onset  . Colon cancer Maternal Grandfather 42  . Cancer Other   . Hypertension Other   .  Arthritis Mother     Social History   Social History  . Marital status: Married    Spouse name: N/A  . Number of children: 2  . Years of education: N/A   Occupational History  . unemployed    Social History Main Topics  . Smoking status: Former Smoker    Quit date: 08/09/2009  . Smokeless tobacco: Never Used  . Alcohol use No  . Drug use: No  . Sexual activity:  Yes   Other Topics Concern  . None   Social History Narrative   Marital status: married x 5 years. Separated from wife in 08/2012.     Children: 2 children from previous marriage; gets children every two weeks; sees more frequently.     Lives: wife; separated from first wife in 08/2012.     Employment: Glass blower/designer.      Tobacco: quit; smoked x 13 years.      Alcohol: once every three months.      Drugs: none now; marijuana in past.      Exercise: None             Vitals:   06/01/17 1148  BP: 120/74  Pulse: (!) 103  Resp: 12  Temp: 98.3 F (36.8 C)  SpO2: 96%    Body mass index is 53.1 kg/m.  Physical Exam  Nursing note and vitals reviewed. Constitutional: He is oriented to person, place, and time. He appears well-developed. No distress.  HENT:  Head: Normocephalic and atraumatic.  Mouth/Throat: Oropharynx is clear and moist and mucous membranes are normal.  Eyes: Pupils are equal, round, and reactive to light. Conjunctivae are normal.  Neck: No tracheal deviation present. No thyroid mass and no thyromegaly present.  Cardiovascular: Regular rhythm.  Tachycardia present.   No murmur heard. Pulses:      Dorsalis pedis pulses are 2+ on the right side, and 2+ on the left side.  Respiratory: Effort normal and breath sounds normal. No respiratory distress.  GI: Soft. He exhibits no mass. There is no hepatomegaly. There is no tenderness. There is no CVA tenderness.  Musculoskeletal: He exhibits no edema or tenderness.       Lumbar back: He exhibits no tenderness and no bony tenderness.  Pain on lower back when moving on exam table.  Lymphadenopathy:    He has no cervical adenopathy.  Neurological: He is alert and oriented to person, place, and time. He has normal strength.  Skin: Skin is warm. No rash noted. No erythema.  Psychiatric: His mood appears anxious. Cognition and memory are normal.  Well groomed, good eye contact.   Diabetic Foot Exam - Simple   Simple  Foot Form Diabetic Foot exam was performed with the following findings:  Yes 06/01/2017 12:40 PM  Visual Inspection No deformities, no ulcerations, no other skin breakdown bilaterally:  Yes Sensation Testing See comments:  Yes Pulse Check Posterior Tibialis and Dorsalis pulse intact bilaterally:  Yes Comments Decreased monofilament forefoot plantar and toes, bilateral.      ASSESSMENT AND PLAN:   [name] was seen today for establish care.  Diagnoses and all orders for this visit:  Lab Results  Component Value Date   HGBA1C 9.8 06/01/2017    Type II diabetes mellitus with neurological manifestations ([place])  HgA1C above goal. We discussed possible complications of poorly controlled diabetes. After discussion of a few pharmacologic treatment options, he agrees with trying Metformin and Victoza. We discussed some side effects. Regular exercise and healthy  diet with avoidance of added sugar food intake is an important part of treatment and recommended. Annual eye exam, periodic dental and foot care recommended. He was instructed to arrange an eye exam. F/U in 3 months  -     POCT glycosylated hemoglobin (Hb A1C) -     POCT glucose (manual entry) -     Comprehensive metabolic panel -     Microalbumin / creatinine urine ratio -     metFORMIN (GLUCOPHAGE) 1000 MG tablet; Take 1 tablet (1,000 mg total) by mouth 2 (two) times daily with a meal. -     liraglutide 18 MG/3ML SOPN; Inject 0.2 mLs (1.2 mg total) into the skin daily.  Chronic fatigue, unspecified  We discussed possible etiologies: Systemic illness, immunologic,endocrinology,sleep disorder, psychiatric/psychologic, infectious,medications side effects, and idiopathic. In his case I'm thinking that poorly controlled diabetes is contributing greatly to this problem. Sleep study needs to be considered if symptom is not improved with improving sleep. Healthy diet and regular physical activity may help.  Further  recommendations will be given according to lab results.  -     CBC -     TSH  Cramps, muscle, general  It seems to be chronic. We discussed possible causes, including electrolyte abnormality. For now I'm not recommending pharmacologic treatment. Further recommendations would be given according to lab results.  -     Comprehensive metabolic panel -     TSH -     CK  Morbid obesity with BMI of 50.0-59.9, adult (HCC)  We discussed benefits of wt loss as well as adverse effects of obesity. Consistency with healthy diet and physical activity recommended.  Gastroesophageal reflux disease, esophagitis presence not specified  She is not interested in PPI. GERD precautions discussed and strongly recommended. Follow-up in 3 months.   OV 12:05 pm to 1:03 pm 46 min face to face OV. > 50% was dedicated to discussion of Dx, prognosis, complications, treatment options, and some side effects of medications. We dedicate a great deal of time discussing the importance of eye exam and a healthier diet.We discussed dietary options, calory count,label reviewed, stop sweet teat and decrease sweets in general. Frustrated because he wants something for energy now, explained that hopefully if he can control his DM he will be able to rest at night.     Hertha Gergen G. [place], MD  [facility]. [place] office.

## 2017-06-01 NOTE — Patient Instructions (Addendum)
A few things to remember from today's visit:   Type II diabetes mellitus with neurological manifestations ([place]) - Plan: POCT glycosylated hemoglobin (Hb A1C), POCT glucose (manual entry), Comprehensive metabolic panel, Microalbumin / creatinine urine ratio, metFORMIN (GLUCOPHAGE) 1000 MG tablet, liraglutide 18 MG/3ML SOPN  Chronic fatigue, unspecified - Plan: CBC, TSH  Cramps, muscle, general - Plan: Comprehensive metabolic panel, TSH, CK   Victoza start 0.6 mg for a week then increase to 1.2 mg.    Diabetes Mellitus and Food It is important for you to manage your blood sugar (glucose) level. Your blood glucose level can be greatly affected by what you eat. Eating healthier foods in the appropriate amounts throughout the day at about the same time each day will help you control your blood glucose level. It can also help slow or prevent worsening of your diabetes mellitus. Healthy eating may even help you improve the level of your blood pressure and reach or maintain a healthy weight. General recommendations for healthful eating and cooking habits include:  Eating meals and snacks regularly. Avoid going long periods of time without eating to lose weight.  Eating a diet that consists mainly of plant-based foods, such as fruits, vegetables, nuts, legumes, and whole grains.  Using low-heat cooking methods, such as baking, instead of high-heat cooking methods, such as deep frying.  Work with your dietitian to make sure you understand how to use the Nutrition Facts information on food labels. How can food affect me? Carbohydrates Carbohydrates affect your blood glucose level more than any other type of food. Your dietitian will help you determine how many carbohydrates to eat at each meal and teach you how to count carbohydrates. Counting carbohydrates is important to keep your blood glucose at a healthy level, especially if you are using insulin or taking certain medicines for diabetes  mellitus. Alcohol Alcohol can cause sudden decreases in blood glucose (hypoglycemia), especially if you use insulin or take certain medicines for diabetes mellitus. Hypoglycemia can be a life-threatening condition. Symptoms of hypoglycemia (sleepiness, dizziness, and disorientation) are similar to symptoms of having too much alcohol. If your health care provider has given you approval to drink alcohol, do so in moderation and use the following guidelines:  Women should not have more than one drink per day, and men should not have more than two drinks per day. One drink is equal to: ? 12 oz of beer. ? 5 oz of wine. ? 1 oz of hard liquor.  Do not drink on an empty stomach.  Keep yourself hydrated. Have water, diet soda, or unsweetened iced tea.  Regular soda, juice, and other mixers might contain a lot of carbohydrates and should be counted.  What foods are not recommended? As you make food choices, it is important to remember that all foods are not the same. Some foods have fewer nutrients per serving than other foods, even though they might have the same number of calories or carbohydrates. It is difficult to get your body what it needs when you eat foods with fewer nutrients. Examples of foods that you should avoid that are high in calories and carbohydrates but low in nutrients include:  Trans fats (most processed foods list trans fats on the Nutrition Facts label).  Regular soda.  Juice.  Candy.  Sweets, such as cake, pie, doughnuts, and cookies.  Fried foods.  What foods can I eat? Eat nutrient-rich foods, which will nourish your body and keep you healthy. The food you should eat also will  depend on several factors, including:  The calories you need.  The medicines you take.  Your weight.  Your blood glucose level.  Your blood pressure level.  Your cholesterol level.  You should eat a variety of foods, including:  Protein. ? Lean cuts of meat. ? Proteins low in  saturated fats, such as fish, egg whites, and beans. Avoid processed meats.  Fruits and vegetables. ? Fruits and vegetables that may help control blood glucose levels, such as apples, mangoes, and yams.  Dairy products. ? Choose fat-free or low-fat dairy products, such as milk, yogurt, and cheese.  Grains, bread, pasta, and rice. ? Choose whole grain products, such as multigrain bread, whole oats, and brown rice. These foods may help control blood pressure.  Fats. ? Foods containing healthful fats, such as nuts, avocado, olive oil, canola oil, and fish.  Does everyone with diabetes mellitus have the same meal plan? Because every person with diabetes mellitus is different, there is not one meal plan that works for everyone. It is very important that you meet with a dietitian who will help you create a meal plan that is just right for you. This information is not intended to replace advice given to you by your health care provider. Make sure you discuss any questions you have with your health care provider. Document Released: 04/22/2005 Document Revised: 01/01/2016 Document Reviewed: 06/22/2013 Elsevier Interactive Patient Education  2017 [place].  Please be sure medication list is accurate. If a new problem present, please set up appointment sooner than planned today.

## 2017-06-02 ENCOUNTER — Telehealth: Payer: Self-pay | Admitting: Family Medicine

## 2017-06-02 NOTE — Telephone Encounter (Signed)
° ° ° ° ° ° ° °  Pt said he need the needles to go with the below med   liraglutide 18 MG/3ML [place] [facility]

## 2017-06-03 MED ORDER — INSULIN PEN NEEDLE 31G X 5 MM MISC: 3 refills | Status: DC

## 2017-06-03 NOTE — Telephone Encounter (Signed)
Rx sent for pen needles.

## 2017-06-05 ENCOUNTER — Encounter: Payer: Self-pay | Admitting: Family Medicine

## 2017-08-22 ENCOUNTER — Other Ambulatory Visit: Payer: Self-pay | Admitting: Family Medicine

## 2017-08-22 DIAGNOSIS — E1149 Type 2 diabetes mellitus with other diabetic neurological complication: Secondary | ICD-10-CM

## 2017-09-01 NOTE — Progress Notes (Signed)
HPI:   [name] is a 42 y.o. male, who is here today for 3-4 months follow up.   He was last seen on 06/01/17  DM II:  Dx in 2012. He is currently on Metformin 1000 mg twice daily and Victoza 1.2 mg daily. He has tolerated medication well, he has no noted side effects. BS: He is not checking blood sugars. He does stop drinking sweet tea, is drinking diet soda, about a liter daily. Polyuria improved.  Eye exam: He has not arranged his eye exam.  Lab Results  Component Value Date   HGBA1C 9.8 06/01/2017   Lab Results  Component Value Date   MICROALBUR 33.2 (H) 06/01/2017     Obesity:  Dietary changes since last OV: He stopped drinking sweet tea but replacing for diet as well as.  Rest of his diet has not changed, he is still eating cakes and cookies if he feels like doing so.  Exercise: None.  Mildly elevated AST and alkaline phosphatase. He has no noted abdominal pain, nausea, vomiting. He denies high alcohol intake.  Lab Results  Component Value Date   ALT 51 06/01/2017   AST 39 (H) 06/01/2017   ALKPHOS 120 (H) 06/01/2017   BILITOT 0.5 06/01/2017    Fatigue has improved since he is sleeping better. He is not getting up to urinate 5-6 times per night as he did when he was drinking sweet tea. He still feels no motivation to exercise.  He denies depressed mood.  He has no history of OSA on not witnessed apnea or loud snoring.  Most of the time he feels fatigue when he wakes up first thing in the morning.  Lab Results  Component Value Date   CHOL 141 10/10/2012   HDL 30 (L) 10/10/2012   LDLCALC 79 10/10/2012   TRIG 162 (H) 10/10/2012   CHOLHDL 4.7 10/10/2012    Review of Systems  Constitutional: Positive for fatigue. Negative for activity change, appetite change and fever.  HENT: Negative for nosebleeds, sore throat and trouble swallowing.   Eyes: Negative for redness and visual disturbance.  Respiratory: Negative for apnea,  cough, shortness of breath and wheezing.   Cardiovascular: Negative for chest pain, palpitations and leg swelling.  Gastrointestinal: Negative for abdominal pain, nausea and vomiting.  Endocrine: Negative for cold intolerance, heat intolerance, polydipsia, polyphagia and polyuria.  Genitourinary: Negative for decreased urine volume, dysuria and hematuria.  Musculoskeletal: Negative for gait problem and myalgias.  Skin: Negative for rash and wound.  Neurological: Negative for syncope, weakness and headaches.  Psychiatric/Behavioral: Positive for sleep disturbance. Negative for confusion. The patient is nervous/anxious.     Current Outpatient Medications on File Prior to Visit  Medication Sig Dispense Refill  . Insulin Pen Needle 31G X 5 MM MISC Use once daily 100 each 3  . metFORMIN (GLUCOPHAGE) 1000 MG tablet Take 1 tablet (1,000 mg total) by mouth 2 (two) times daily with a meal. 60 tablet 3  . VICTOZA 18 MG/3ML SOPN ADMINISTER 1.2 MG UNDER THE SKIN DAILY 6 mL 0   No current facility-administered medications on file prior to visit.      Past Medical History:  Diagnosis Date  . Diabetes mellitus without complication ([place])   . Gout   . Obesity, Class III, BMI 40-49.9 (morbid obesity) (HCC)    No Known Allergies  Social History   Socioeconomic History  . Marital status: Married    Spouse name: None  . Number  of children: 2  . Years of education: None  . Highest education level: None  Social Needs  . Financial resource strain: None  . Food insecurity - worry: None  . Food insecurity - inability: None  . Transportation needs - medical: None  . Transportation needs - non-medical: None  Occupational History  . Occupation: unemployed  Tobacco Use  . Smoking status: Former Smoker    Last attempt to quit: 08/09/2009    Years since quitting: 8.0  . Smokeless tobacco: Never Used  Substance and Sexual Activity  . Alcohol use: No  . Drug use: No  . Sexual activity: Yes  Other  Topics Concern  . None  Social History Narrative   Marital status: married x 5 years. Separated from wife in 08/2012.     Children: 2 children from previous marriage; gets children every two weeks; sees more frequently.     Lives: alone; separated from wife in 08/2012.     Employment: Glass blower/designer.      Tobacco: quit; smoked x 13 years.      Alcohol: once every three months.      Drugs: none now; marijuana in past.      Exercise: started going to gym in 08/2012; going 4-5 days per week; elliptical and stationary bike.             Vitals:   09/02/17 0727  BP: 130/78  Pulse: 84  Resp: 12  Temp: 98.6 F (37 C)  SpO2: 97%   Body mass index is 53.07 kg/m.  Wt Readings from Last 3 Encounters:  09/02/17 (!) 402 lb 4 oz (182.5 kg)  06/01/17 (!) 402 lb 8 oz (182.6 kg)  03/31/15 (!) 373 lb 6.4 oz (169.4 kg)     Physical Exam  Nursing note and vitals reviewed. Constitutional: He is oriented to person, place, and time. He appears well-developed. No distress.  HENT:  Head: Normocephalic and atraumatic.  Mouth/Throat: Oropharynx is clear and moist and mucous membranes are normal.  Eyes: Conjunctivae are normal. Pupils are equal, round, and reactive to light.  Cardiovascular: Normal rate and regular rhythm.  No murmur heard. Pulses:      Dorsalis pedis pulses are 2+ on the right side, and 2+ on the left side.  Respiratory: Effort normal and breath sounds normal. No respiratory distress.  GI: Soft. He exhibits no mass. There is no hepatomegaly. There is no tenderness.  Musculoskeletal: He exhibits edema (Trace pitting edema LE, bilateral). He exhibits no tenderness.  Lymphadenopathy:    He has no cervical adenopathy.  Neurological: He is alert and oriented to person, place, and time. He has normal strength.  Skin: Skin is warm. No erythema.  Psychiatric: His mood appears anxious. His affect is blunt.  Well groomed, good eye contact.    Foot exam 05/2017  ASSESSMENT AND  PLAN:   Miguel Sanders was seen today for 3-4 months follow-up.   Diagnoses and all orders for this visit:  Lab Results  Component Value Date   HGBA1C 7.0 (H) 09/02/2017   Lab Results  Component Value Date   ALT 39 09/02/2017   AST 22 09/02/2017   ALKPHOS 78 09/02/2017   BILITOT 0.5 09/02/2017   Lab Results  Component Value Date   CREATININE 0.85 09/02/2017   BUN 15 09/02/2017   NA 137 09/02/2017   K 4.7 09/02/2017   CL 101 09/02/2017   CO2 27 09/02/2017   Lab Results  Component Value Date  MICROALBUR 4.4 (H) 09/02/2017    Type II diabetes mellitus with neurological manifestations ([place])  HgA1C pending today. No changes in current management,will adjust meds according to HgA1C Regular exercise and healthy diet with avoidance of added sugar food intake is an important part of treatment and recommended. Annual eye exam, periodic dental and foot care recommended. F/U in 4 months  -     Comprehensive metabolic panel -     Microalbumin / creatinine urine ratio -     Hemoglobin A1c  Liver function study, abnormal  Possible etiologies discussed. ? Fatty liver. Weight loss strongly recommended.  -     Comprehensive metabolic panel  Morbid obesity with BMI of 50.0-59.9, adult (HCC)  Weight overall stable. We discussed benefits of wt loss as well as adverse effects of obesity. Consistency with healthy diet and physical activity recommended. Brisk walking for 15-30 min as tolerated. We also discussed pharmacologic treatment options, most she knows are not caudal by his insurance.    Wellbutrin SR 150 mg might help with weight loss as well as depressed mood.  We discussed some side effects, recommend starting Wellbutrin SR 150 mg one tablet daily the first week and increase to twice daily if well tolerated.  -     buPROPion (WELLBUTRIN SR) 150 MG 12 hr tablet; Take 1 tablet (150 mg total) by mouth 2 (two) times daily.  Chronic fatigue,  unspecified  Improved some with better sleep. Regular exercise on a healthy diet might also help. Is not interested in sleep study for now.  Hyperlipidemia associated with type 2 diabetes mellitus ([place])  Low-fat diet recommended for now. Further recommendations will be given according to lab results.  -     Lipid panel    Refused flu shot, pneumonia vaccine, and referral to nutrition/diabetes education.      -Miguel Sanders was advised to return sooner than planned today if new concerns arise.       Lorelei Heikkila G. [place], MD  [facility]. [place] office.

## 2017-09-02 ENCOUNTER — Ambulatory Visit: Payer: 59 | Admitting: Family Medicine

## 2017-09-02 ENCOUNTER — Encounter: Payer: Self-pay | Admitting: Family Medicine

## 2017-09-02 VITALS — BP 130/78 | HR 84 | Temp 98.6°F | Resp 12 | Ht 73.0 in | Wt >= 6400 oz

## 2017-09-02 DIAGNOSIS — E1169 Type 2 diabetes mellitus with other specified complication: Secondary | ICD-10-CM

## 2017-09-02 DIAGNOSIS — E785 Hyperlipidemia, unspecified: Secondary | ICD-10-CM

## 2017-09-02 DIAGNOSIS — Z6841 Body Mass Index (BMI) 40.0 and over, adult: Secondary | ICD-10-CM | POA: Diagnosis not present

## 2017-09-02 DIAGNOSIS — R5382 Chronic fatigue, unspecified: Secondary | ICD-10-CM | POA: Diagnosis not present

## 2017-09-02 DIAGNOSIS — E1149 Type 2 diabetes mellitus with other diabetic neurological complication: Secondary | ICD-10-CM

## 2017-09-02 DIAGNOSIS — R945 Abnormal results of liver function studies: Secondary | ICD-10-CM

## 2017-09-02 LAB — COMPREHENSIVE METABOLIC PANEL
ALBUMIN: 3.8 g/dL (ref 3.5–5.2)
ALK PHOS: 78 U/L (ref 39–117)
ALT: 39 U/L (ref 0–53)
AST: 22 U/L (ref 0–37)
BUN: 15 mg/dL (ref 6–23)
CO2: 27 mEq/L (ref 19–32)
CREATININE: 0.85 mg/dL (ref 0.40–1.50)
Calcium: 9.2 mg/dL (ref 8.4–10.5)
Chloride: 101 mEq/L (ref 96–112)
GFR: 105.88 mL/min (ref >60.00)
GLUCOSE: 140 mg/dL — AB (ref 70–99)
Potassium: 4.7 mEq/L (ref 3.5–5.1)
SODIUM: 137 meq/L (ref 135–145)
TOTAL PROTEIN: 6.9 g/dL (ref 6.0–8.3)
Total Bilirubin: 0.5 mg/dL (ref 0.2–1.2)

## 2017-09-02 LAB — LIPID PANEL
CHOL/HDL RATIO: 6
Cholesterol: 152 mg/dL (ref 0–200)
HDL: 26.2 mg/dL — AB (ref >39.00)
NonHDL: 126.18
Triglycerides: 265 mg/dL — ABNORMAL HIGH (ref 0.0–149.0)
VLDL: 53 mg/dL — AB (ref 0.0–40.0)

## 2017-09-02 LAB — MICROALBUMIN / CREATININE URINE RATIO
Creatinine,U: 132.9 mg/dL
MICROALB UR: 4.4 mg/dL — AB (ref 0.0–1.9)
Microalb Creat Ratio: 3.3 mg/g (ref 0.0–30.0)

## 2017-09-02 LAB — HEMOGLOBIN A1C: HEMOGLOBIN A1C: 7 % — AB (ref 4.6–6.5)

## 2017-09-02 LAB — LDL CHOLESTEROL, DIRECT: LDL DIRECT: 89 mg/dL

## 2017-09-02 MED ORDER — BUPROPION HCL ER (SR) 150 MG PO TB12: 150.0000 mg | ORAL_TABLET | Freq: Two times a day (BID) | ORAL | 2 refills | Status: DC

## 2017-09-02 NOTE — Patient Instructions (Signed)
A few things to remember from today's visit:   Liver function study, abnormal - Plan: Comprehensive metabolic panel  Type II diabetes mellitus with neurological manifestations ([place]) - Plan: Comprehensive metabolic panel, Microalbumin / creatinine urine ratio, Hemoglobin A1c, Lipid panel  Morbid obesity with BMI of 50.0-59.9, adult (HCC) - Plan: Lipid panel  Chronic fatigue, unspecified  Start Wellbutrin 150 mg daily for a week and increase to twice daily if well tolerated. Let me know if you are having any side effects. HgA1C goal < 7.0. Avoid sugar added food:regular soft drinks, energy drinks, and sports drinks. candy. cakes. cookies. pies and cobblers. sweet rolls, pastries, and donuts. fruit drinks, such as fruitades and fruit punch. dairy desserts, such as ice cream  Mediterranean diet has showed benefits for sugar control.  How much and what type of carbohydrate foods are important for managing diabetes. The balance between how much insulin is in your body and the carbohydrate you eat makes a difference in your blood glucose levels.  Fasting blood sugar ideally 130 or less, 2 hours after meals less than 180.   Regular exercise also will help with controlling disease, daily brisk walking as tolerated for 15-30 min definitively will help.   Avoid skipping meals, blood sugar might drop and cause serious problems. Remember checking feet periodically, good dental hygiene, and annual eye exam.     Please be sure medication list is accurate. If a new problem present, please set up appointment sooner than planned today.

## 2017-09-03 ENCOUNTER — Encounter: Payer: Self-pay | Admitting: Family Medicine

## 2017-09-05 ENCOUNTER — Other Ambulatory Visit: Payer: Self-pay

## 2017-09-05 DIAGNOSIS — E785 Hyperlipidemia, unspecified: Principal | ICD-10-CM

## 2017-09-05 DIAGNOSIS — E1169 Type 2 diabetes mellitus with other specified complication: Secondary | ICD-10-CM

## 2017-09-05 MED ORDER — ATORVASTATIN CALCIUM 20 MG PO TABS: 20.0000 mg | ORAL_TABLET | Freq: Every day | ORAL | 1 refills | Status: DC

## 2017-09-05 NOTE — Telephone Encounter (Signed)
Atorvastatin sent to Patient preferred pharmacy.

## 2017-10-01 ENCOUNTER — Other Ambulatory Visit: Payer: Self-pay | Admitting: Family Medicine

## 2017-10-01 DIAGNOSIS — E1149 Type 2 diabetes mellitus with other diabetic neurological complication: Secondary | ICD-10-CM

## 2017-11-01 ENCOUNTER — Other Ambulatory Visit: Payer: Self-pay | Admitting: Family Medicine

## 2017-11-01 DIAGNOSIS — E1149 Type 2 diabetes mellitus with other diabetic neurological complication: Secondary | ICD-10-CM

## 2017-11-28 NOTE — Progress Notes (Signed)
HPI:  Chief Complaint  Patient presents with  . Medication Refill    wants gout medication and possible change in metformin due to causing diarrhea every day    [name] is a 42 y.o. male, who is here today for 3 months follow up.   She was last seen on 09/02/2017.  Last visit Wellbutrin SR 150 mg twice daily was added to help with depressed mood on weight loss. He did not file Wellbutrin to help with mood. No side effects reported.  He is also concerned about gout, he reporting history of attacks, he has had about 2 to 3 weeks since his last visit. In the past he has been on allopurinol and colchicine. Usually he has attacks and knees and ankles. He has not identified exacerbating factors.  Diabetes Mellitus II:   Dx in 2012. Currently on metformin 1000 mg twice daily and Victoza 1.2 mg daily.  Checking BS's : He is not checking it. Hypoglycemia:No  Metformin is causing diarrhea, 2-3 stools daily, no blood.  Last eye exam over a year.  Negative abdominal pain, nausea, vomiting, polydipsia, polyuria, or polyphagia. No numbness, tingling, or burning.    Lab Results  Component Value Date   CREATININE 0.85 09/02/2017   BUN 15 09/02/2017   NA 137 09/02/2017   K 4.7 09/02/2017   CL 101 09/02/2017   CO2 27 09/02/2017    Lab Results  Component Value Date   HGBA1C 7.0 (H) 09/02/2017   Lab Results  Component Value Date   MICROALBUR 4.4 (H) 09/02/2017      Hyperlipidemia:  Currently on atorvastatin 20 mg daily, which was started last visit. Following a low fat diet: He is using air fryer.Marland Kitchen  He has not noted side effects with medication.  Lab Results  Component Value Date   CHOL 152 09/02/2017   HDL 26.20 (L) 09/02/2017   LDLCALC 79 10/10/2012   LDLDIRECT 89.0 09/02/2017   TRIG 265.0 (H) 09/02/2017   CHOLHDL 6 09/02/2017   Lab Results  Component Value Date   ALT 39 09/02/2017   AST 22 09/02/2017   ALKPHOS 78 09/02/2017   BILITOT 0.5 09/02/2017   He is not exercising consistently, trying to eat healthier.   Concerns today: -Left ear discomfort, he feels like something is in there has been going on for the past 3 to 4 months.  He denies earaches or drainage. He uses Q-tips frequently. No recent URI. No hearing loss.   He is also concerned about kidney issues, few days ago he had right lower back pain, usually in the morning and improve with movement. Pain lasted about 2 to 3 days. Denies dysuria,increased urinary frequency, gross hematuria,or decreased urine output.    Review of Systems  Constitutional: Positive for fatigue (improved). Negative for activity change, appetite change and fever.  HENT: Negative for nosebleeds, sore throat and trouble swallowing.   Eyes: Negative for redness and visual disturbance.  Respiratory: Negative for cough, shortness of breath and wheezing.   Cardiovascular: Negative for chest pain, palpitations and leg swelling.  Gastrointestinal: Positive for diarrhea. Negative for abdominal pain, nausea and vomiting.  Endocrine: Negative for polydipsia, polyphagia and polyuria.  Genitourinary: Negative for decreased urine volume, dysuria and hematuria.  Musculoskeletal: Positive for arthralgias and back pain.  Skin: Negative for rash and wound.  Neurological: Negative for weakness, numbness and headaches.      Current Outpatient Medications on File Prior to Visit  Medication Sig Dispense  Refill  . atorvastatin (LIPITOR) 20 MG tablet Take 1 tablet (20 mg total) by mouth daily. Take 1 tablet by mouth daily with supper. 90 tablet 1  . Insulin Pen Needle 31G X 5 MM MISC Use once daily 100 each 3  . VICTOZA 18 MG/3ML SOPN INJECT 1.2 MG UNDER THE SKIN EVERY DAY AS DIRECTED 6 mL 0   No current facility-administered medications on file prior to visit.      Past Medical History:  Diagnosis Date  . Diabetes mellitus without complication ([place])   . Gout   . Obesity, Class III,  BMI 40-49.9 (morbid obesity) (HCC)    No Known Allergies  Social History   Socioeconomic History  . Marital status: Married    Spouse name: Not on file  . Number of children: 2  . Years of education: Not on file  . Highest education level: Not on file  Occupational History  . Occupation: unemployed  Social Needs  . Financial resource strain: Not on file  . Food insecurity:    Worry: Not on file    Inability: Not on file  . Transportation needs:    Medical: Not on file    Non-medical: Not on file  Tobacco Use  . Smoking status: Former Smoker    Last attempt to quit: 08/09/2009    Years since quitting: 8.3  . Smokeless tobacco: Never Used  Substance and Sexual Activity  . Alcohol use: No  . Drug use: No  . Sexual activity: Yes  Lifestyle  . Physical activity:    Days per week: Not on file    Minutes per session: Not on file  . Stress: Not on file  Relationships  . Social connections:    Talks on phone: Not on file    Gets together: Not on file    Attends religious service: Not on file    Active member of club or organization: Not on file    Attends meetings of clubs or organizations: Not on file    Relationship status: Not on file  Other Topics Concern  . Not on file  Social History Narrative   Marital status: married x 5 years. Separated from wife in 08/2012.     Children: 2 children from previous marriage; gets children every two weeks; sees more frequently.     Lives: alone; separated from wife in 08/2012.     Employment: Glass blower/designer.      Tobacco: quit; smoked x 13 years.      Alcohol: once every three months.      Drugs: none now; marijuana in past.      Exercise: started going to gym in 08/2012; going 4-5 days per week; elliptical and stationary bike.             Vitals:   11/29/17 0738  BP: 130/84  Pulse: 71  Resp: 16  Temp: 98.6 F (37 C)  SpO2: 98%   Body mass index is 52.71 kg/m.  Wt Readings from Last 3 Encounters:  11/29/17 (!) 399 lb  8 oz (181.2 kg)  09/02/17 (!) 402 lb 4 oz (182.5 kg)  06/01/17 (!) 402 lb 8 oz (182.6 kg)     Physical Exam  Nursing note and vitals reviewed. Constitutional: He is oriented to person, place, and time. He appears well-developed. No distress.  HENT:  Head: Normocephalic and atraumatic.  Right Ear: Tympanic membrane, external ear and ear canal normal.  Left Ear: Tympanic membrane, external ear and ear  canal normal.  Mouth/Throat: Oropharynx is clear and moist and mucous membranes are normal.  No cerumen left ear.  Eyes: Pupils are equal, round, and reactive to light. Conjunctivae are normal.  Cardiovascular: Normal rate and regular rhythm.  No murmur heard. Pulses:      Dorsalis pedis pulses are 2+ on the right side, and 2+ on the left side.  Respiratory: Effort normal and breath sounds normal. No respiratory distress.  GI: Soft. He exhibits no mass. There is no hepatomegaly. There is no tenderness.  Musculoskeletal: He exhibits edema (Trace pitting edema LE, bilateral).  Lymphadenopathy:    He has no cervical adenopathy.  Neurological: He is alert and oriented to person, place, and time. He has normal strength.  Skin: Skin is warm. No erythema.  Psychiatric: He has a normal mood and affect. Cognition and memory are normal.  Well groomed, good eye contact.       ASSESSMENT AND PLAN:   [name] was seen today for 4 months follow-up.  Orders Placed This Encounter  Procedures  . Hepatic function panel  . Lipid panel  . Hemoglobin A1c  . Basic metabolic panel  . Urinalysis, Routine w reflex microscopic  . Uric acid  . LDL cholesterol, direct    Hyperlipidemia associated with type 2 diabetes mellitus ([place]) No changes in current management, will follow labs done today and will give further recommendations accordingly. Low fat diet is also recommended. F/U in 6-12 months.  Type II diabetes mellitus with neurological manifestations ([place]) HgA1C ordered and  pending today. Metformin discontinued due to GI side effects.  The pain did not hemoglobin A1c number we may consider combination of long-acting metformin with a different agent, which sometimes is better tolerated that just metformin. Regular exercise and healthy diet with avoidance of added sugar food intake are important part of treatment and other recommended. Annual eye exam, periodic dental and good foot care. F/U in 5-6 months   History of acute gouty arthritis Today allopurinol 100 mg was added, we discussed some side effects. Colchicine 0.6 mg twice daily for a week and then 2 tablets as needed when acute exacerbation. He also to follow low Purine diet for prevention.   Morbid obesity with BMI of 50.0-59.9, adult ([place])  Lost about 3 Lb sine his last visit. We discussed benefits of wt loss as well as adverse effects of obesity. Consistency with healthy diet and physical activity recommended.  Since he feels like Wellbutrin did not help,he will start weaning medication off.  - Hepatic function panel  Hyperglycemia  - Hemoglobin A1c   -[name] was advised to return sooner than planned today if new concerns arise.       Miguel Sanders G. Martinique, MD  [facility]. [place] office.

## 2017-11-29 ENCOUNTER — Ambulatory Visit: Payer: 59 | Admitting: Family Medicine

## 2017-11-29 ENCOUNTER — Encounter: Payer: Self-pay | Admitting: Family Medicine

## 2017-11-29 VITALS — BP 130/84 | HR 71 | Temp 98.6°F | Resp 16 | Ht 73.0 in | Wt 399.5 lb

## 2017-11-29 DIAGNOSIS — E1149 Type 2 diabetes mellitus with other diabetic neurological complication: Secondary | ICD-10-CM

## 2017-11-29 DIAGNOSIS — R739 Hyperglycemia, unspecified: Secondary | ICD-10-CM

## 2017-11-29 DIAGNOSIS — M109 Gout, unspecified: Secondary | ICD-10-CM

## 2017-11-29 DIAGNOSIS — Z6841 Body Mass Index (BMI) 40.0 and over, adult: Secondary | ICD-10-CM | POA: Diagnosis not present

## 2017-11-29 DIAGNOSIS — Z8739 Personal history of other diseases of the musculoskeletal system and connective tissue: Secondary | ICD-10-CM

## 2017-11-29 DIAGNOSIS — E785 Hyperlipidemia, unspecified: Secondary | ICD-10-CM | POA: Diagnosis not present

## 2017-11-29 DIAGNOSIS — E1165 Type 2 diabetes mellitus with hyperglycemia: Secondary | ICD-10-CM | POA: Diagnosis not present

## 2017-11-29 DIAGNOSIS — E1169 Type 2 diabetes mellitus with other specified complication: Secondary | ICD-10-CM | POA: Diagnosis not present

## 2017-11-29 LAB — URINALYSIS, ROUTINE W REFLEX MICROSCOPIC
Bilirubin Urine: NEGATIVE
Hgb urine dipstick: NEGATIVE
KETONES UR: NEGATIVE
Leukocytes, UA: NEGATIVE
Nitrite: NEGATIVE
PH: 6 (ref 5.0–8.0)
RBC / HPF: NONE SEEN (ref 0–?)
SPECIFIC GRAVITY, URINE: 1.025 (ref 1.000–1.030)
Total Protein, Urine: NEGATIVE
UROBILINOGEN UA: 0.2 (ref 0.0–1.0)
Urine Glucose: NEGATIVE

## 2017-11-29 LAB — BASIC METABOLIC PANEL
BUN: 11 mg/dL (ref 6–23)
CALCIUM: 9.1 mg/dL (ref 8.4–10.5)
CHLORIDE: 103 meq/L (ref 96–112)
CO2: 32 mEq/L (ref 19–32)
Creatinine, Ser: 0.83 mg/dL (ref 0.40–1.50)
GFR: 108.69 mL/min (ref >60.00)
GLUCOSE: 141 mg/dL — AB (ref 70–99)
Potassium: 4.7 mEq/L (ref 3.5–5.1)
Sodium: 139 mEq/L (ref 135–145)

## 2017-11-29 LAB — LDL CHOLESTEROL, DIRECT: LDL DIRECT: 76 mg/dL

## 2017-11-29 LAB — HEPATIC FUNCTION PANEL
ALBUMIN: 3.8 g/dL (ref 3.5–5.2)
ALK PHOS: 82 U/L (ref 39–117)
ALT: 29 U/L (ref 0–53)
AST: 16 U/L (ref 0–37)
Bilirubin, Direct: 0.1 mg/dL (ref 0.0–0.3)
TOTAL PROTEIN: 6.7 g/dL (ref 6.0–8.3)
Total Bilirubin: 0.5 mg/dL (ref 0.2–1.2)

## 2017-11-29 LAB — LIPID PANEL
CHOLESTEROL: 137 mg/dL (ref 0–200)
HDL: 31 mg/dL — ABNORMAL LOW (ref >39.00)
NonHDL: 105.99
TRIGLYCERIDES: 221 mg/dL — AB (ref 0.0–149.0)
Total CHOL/HDL Ratio: 4
VLDL: 44.2 mg/dL — AB (ref 0.0–40.0)

## 2017-11-29 LAB — HEMOGLOBIN A1C: HEMOGLOBIN A1C: 6.6 % — AB (ref 4.6–6.5)

## 2017-11-29 MED ORDER — COLCHICINE 0.6 MG PO TABS: 0.6000 mg | ORAL_TABLET | Freq: Every day | ORAL | 1 refills | Status: DC | PRN

## 2017-11-29 MED ORDER — ALLOPURINOL 100 MG PO TABS: 100.0000 mg | ORAL_TABLET | Freq: Every day | ORAL | 1 refills | Status: DC

## 2017-11-29 NOTE — Assessment & Plan Note (Signed)
No changes in current management, will follow labs done today and will give further recommendations accordingly. Low fat diet is also recommended. F/U in 6-12 months.

## 2017-11-29 NOTE — Assessment & Plan Note (Signed)
HgA1C ordered and pending today. Metformin discontinued due to GI side effects.  The pain did not hemoglobin A1c number we may consider combination of long-acting metformin with a different agent, which sometimes is better tolerated that just metformin. Regular exercise and healthy diet with avoidance of added sugar food intake are important part of treatment and other recommended. Annual eye exam, periodic dental and good foot care. F/U in 5-6 months

## 2017-11-29 NOTE — Assessment & Plan Note (Signed)
Today allopurinol 100 mg was added, we discussed some side effects. Colchicine 0.6 mg twice daily for a week and then 2 tablets as needed when acute exacerbation. He also to follow low Purine diet for prevention.

## 2017-11-29 NOTE — Patient Instructions (Signed)
A few things to remember from today's visit:   Type II diabetes mellitus with neurological manifestations ([place]) - Plan: Basic metabolic panel, Urinalysis, Routine w reflex microscopic  Morbid obesity with BMI of 50.0-59.9, adult ([place]) - Plan: Hepatic function panel  Hyperlipidemia associated with type 2 diabetes mellitus ([place]) - Plan: Hepatic function panel, Lipid panel  Hyperglycemia - Plan: Hemoglobin A1c  Depending of hemoglobin A1C we will add a different agent or combination of Metformin with another agent,which sometimes is well tolerated.   Please be sure medication list is accurate. If a new problem present, please set up appointment sooner than planned today.

## 2017-11-30 ENCOUNTER — Telehealth: Payer: Self-pay | Admitting: *Deleted

## 2017-11-30 NOTE — Telephone Encounter (Signed)
Colchicine 0.6 MG # 30, Note from pharmacy: plan does not cover medication, PA needed or change medication

## 2017-12-04 ENCOUNTER — Encounter: Payer: Self-pay | Admitting: Family Medicine

## 2017-12-04 MED ORDER — EMPAGLIFLOZIN 10 MG PO TABS: 10.0000 mg | ORAL_TABLET | Freq: Every day | ORAL | 1 refills | Status: DC

## 2017-12-05 NOTE — Telephone Encounter (Signed)
There is not other option, so we wait for PA. He is already on Allopurinol.  BJ

## 2017-12-24 ENCOUNTER — Other Ambulatory Visit: Payer: Self-pay | Admitting: Family Medicine

## 2017-12-24 DIAGNOSIS — E1149 Type 2 diabetes mellitus with other diabetic neurological complication: Secondary | ICD-10-CM

## 2018-01-21 ENCOUNTER — Other Ambulatory Visit: Payer: Self-pay | Admitting: Family Medicine

## 2018-01-21 DIAGNOSIS — E1149 Type 2 diabetes mellitus with other diabetic neurological complication: Secondary | ICD-10-CM

## 2018-02-07 ENCOUNTER — Other Ambulatory Visit: Payer: Self-pay | Admitting: Family Medicine

## 2018-02-07 DIAGNOSIS — Z8739 Personal history of other diseases of the musculoskeletal system and connective tissue: Secondary | ICD-10-CM

## 2018-03-20 ENCOUNTER — Other Ambulatory Visit: Payer: Self-pay | Admitting: Family Medicine

## 2018-03-20 DIAGNOSIS — E1149 Type 2 diabetes mellitus with other diabetic neurological complication: Secondary | ICD-10-CM

## 2018-04-17 ENCOUNTER — Other Ambulatory Visit: Payer: Self-pay | Admitting: Family Medicine

## 2018-04-17 DIAGNOSIS — E1149 Type 2 diabetes mellitus with other diabetic neurological complication: Secondary | ICD-10-CM

## 2018-04-30 DIAGNOSIS — M109 Gout, unspecified: Secondary | ICD-10-CM | POA: Insufficient documentation

## 2018-04-30 NOTE — Progress Notes (Signed)
HPI:   Miguel Sanders is a 42 y.o. male, who is here today for 4-5 months follow up.    Diabetes Mellitus II:   Currently on Victoza  1.2 mg daily, Jardiance 10 mg daily. Checking BS's : Not checking.  He is tolerating medications well. He denies abdominal pain, nausea, vomiting, polydipsia, polyuria, or polyphagia. No numbness, tingling, or burning.   Lab Results  Component Value Date   CREATININE 0.83 11/29/2017   BUN 11 11/29/2017   NA 139 11/29/2017   K 4.7 11/29/2017   CL 103 11/29/2017   CO2 32 11/29/2017    Lab Results  Component Value Date   HGBA1C 6.6 (H) 11/29/2017   Lab Results  Component Value Date   MICROALBUR 4.4 (H) 09/02/2017   No major changes in his diet since his last visit. He does not exercise regularly.     Gout: He is Allopurinol 100 mg. He is tolerating medication well. He has not had any episodes of acute gout since he is starting medication. He took colchicine when he first started allopurinol, no side effects.   Review of Systems  Constitutional: Negative for activity change, appetite change, fatigue, fever and unexpected weight change.  HENT: Negative for nosebleeds, sore throat and trouble swallowing.   Eyes: Negative for redness and visual disturbance.  Respiratory: Negative for cough, shortness of breath and wheezing.   Cardiovascular: Negative for chest pain, palpitations and leg swelling.  Gastrointestinal: Negative for abdominal pain, nausea and vomiting.  Endocrine: Negative for polydipsia, polyphagia and polyuria.  Genitourinary: Negative for decreased urine volume, dysuria and hematuria.  Musculoskeletal: Positive for arthralgias. Negative for gait problem and myalgias.  Neurological: Negative for syncope, weakness, numbness and headaches.  Psychiatric/Behavioral: Negative for confusion. The patient is nervous/anxious.       Current Outpatient Medications on File Prior to Visit  Medication Sig  Dispense Refill  . allopurinol (ZYLOPRIM) 100 MG tablet Take 1 tablet (100 mg total) by mouth daily. 90 tablet 1  . empagliflozin (JARDIANCE) 10 MG TABS tablet Take 10 mg by mouth daily. 90 tablet 1  . Insulin Pen Needle 31G X 5 MM MISC Use once daily 100 each 3  . MITIGARE 0.6 MG CAPS TAKE 1 TABLET BY MOUTH DAILY AS NEEDED. HOLD ATORVASTAIN WHEN TAKING THIS MEDICATION 30 capsule 3  . VICTOZA 18 MG/3ML SOPN ADMINISTER 1.2 MG UNDER THE SKIN EVERY DAY AS DIRECTED 6 mL 0  . VICTOZA 18 MG/3ML SOPN ADMINISTER 1.2 MG UNDER THE SKIN EVERY DAY AS DIRECTED 6 mL 0  . atorvastatin (LIPITOR) 20 MG tablet Take 1 tablet (20 mg total) by mouth daily. Take 1 tablet by mouth daily with supper. (Patient not taking: Reported on 05/01/2018) 90 tablet 1   No current facility-administered medications on file prior to visit.      Past Medical History:  Diagnosis Date  . Diabetes mellitus without complication ([place])   . Gout   . Obesity, Class III, BMI 40-49.9 (morbid obesity) (HCC)    No Known Allergies  Social History   Socioeconomic History  . Marital status: Married    Spouse name: Not on file  . Number of children: 2  . Years of education: Not on file  . Highest education level: Not on file  Occupational History  . Occupation: unemployed  Social Needs  . Financial resource strain: Not on file  . Food insecurity:    Worry: Not on file    Inability: Not  on file  . Transportation needs:    Medical: Not on file    Non-medical: Not on file  Tobacco Use  . Smoking status: Former Smoker    Last attempt to quit: 08/09/2009    Years since quitting: 8.7  . Smokeless tobacco: Never Used  Substance and Sexual Activity  . Alcohol use: No  . Drug use: No  . Sexual activity: Yes  Lifestyle  . Physical activity:    Days per week: Not on file    Minutes per session: Not on file  . Stress: Not on file  Relationships  . Social connections:    Talks on phone: Not on file    Gets together: Not on file     Attends religious service: Not on file    Active member of club or organization: Not on file    Attends meetings of clubs or organizations: Not on file    Relationship status: Not on file  Other Topics Concern  . Not on file  Social History Narrative   Marital status: married x 5 years. Separated from wife in 08/2012.     Children: 2 children from previous marriage; gets children every two weeks; sees more frequently.     Lives: alone; separated from wife in 08/2012.     Employment: Glass blower/designer.      Tobacco: quit; smoked x 13 years.      Alcohol: once every three months.      Drugs: none now; marijuana in past.      Exercise: started going to gym in 08/2012; going 4-5 days per week; elliptical and stationary bike.             Vitals:   05/01/18 0750 05/01/18 0811  BP: (!) 140/92 130/78  Pulse: 84   Resp: 14   Temp: 98.5 F (36.9 C)   SpO2: 98%    Body mass index is 51.85 kg/m.  Wt Readings from Last 3 Encounters:  05/01/18 (!) 393 lb (178.3 kg)  11/29/17 (!) 399 lb 8 oz (181.2 kg)  09/02/17 (!) 402 lb 4 oz (182.5 kg)    Physical Exam  Nursing note and vitals reviewed. Constitutional: He is oriented to person, place, and time. He appears well-developed. No distress.  HENT:  Head: Normocephalic and atraumatic.  Mouth/Throat: Oropharynx is clear and moist and mucous membranes are normal.  Eyes: Pupils are equal, round, and reactive to light. Conjunctivae are normal.  Cardiovascular: Normal rate and regular rhythm.  No murmur heard. Pulses:      Dorsalis pedis pulses are 2+ on the right side, and 2+ on the left side.  Respiratory: Effort normal and breath sounds normal. No respiratory distress.  GI: Soft. He exhibits no mass. There is no hepatomegaly. There is no tenderness.  Musculoskeletal: He exhibits edema (Trace pitting edema LE, bilateral.).  Lymphadenopathy:    He has no cervical adenopathy.  Neurological: He is alert and oriented to person, place, and  time. He has normal strength. No cranial nerve deficit. Gait normal.  Skin: Skin is warm. No rash noted. No erythema.  Psychiatric: He has a normal mood and affect. Cognition and memory are normal.  Well groomed, good eye contact.       ASSESSMENT AND PLAN:   Miguel Sanders was seen today for 4-5 months follow-up.  Orders Placed This Encounter  Procedures  . Basic metabolic panel  . Hemoglobin A1c  . Uric acid   Lab Results  Component Value  Date   HGBA1C 6.6 (H) 05/01/2018   Lab Results  Component Value Date   CREATININE 0.88 05/01/2018   BUN 14 05/01/2018   NA 140 05/01/2018   K 4.2 05/01/2018   CL 104 05/01/2018   CO2 26 05/01/2018   Lab Results  Component Value Date   LABURIC 4.8 05/01/2018    Gouty arthropathy Well-controlled. Continue allopurinol 100 mg daily, we discussed some side effects. Low purines diet also to continue.   Type II diabetes mellitus with neurological manifestations ([place]) HgA1C was at goal, today A1c is pending. No changes in current management but will adjust medications according to A1c results. Regular exercise and healthy diet with avoidance of added sugar food intake is an important part of treatment and recommended. He needs an eye exam, recommend arranging appointment with eye care provider. Good foot care also recommended. F/U in 5-6 months   Morbid obesity with BMI of 50.0-59.9, adult [facility]) He has been losing weight steadily. Since his last visit in 11/2017 he has lost about 6 pounds. We discussed the benefits of weight loss. Encouraged to continue working on improving dietary habits as well as regular physical activity.   Elevated blood pressure reading Rechecked 130/78. Recommend monitoring BP at home regularly.    Ellamae Lybeck G. Martinique, MD  [facility]. [place] office.

## 2018-05-01 ENCOUNTER — Encounter: Payer: Self-pay | Admitting: Family Medicine

## 2018-05-01 ENCOUNTER — Ambulatory Visit: Payer: 59 | Admitting: Family Medicine

## 2018-05-01 VITALS — BP 130/78 | HR 84 | Temp 98.5°F | Resp 14 | Ht 73.0 in | Wt 393.0 lb

## 2018-05-01 DIAGNOSIS — Z6841 Body Mass Index (BMI) 40.0 and over, adult: Secondary | ICD-10-CM

## 2018-05-01 DIAGNOSIS — Z23 Encounter for immunization: Secondary | ICD-10-CM

## 2018-05-01 DIAGNOSIS — E1149 Type 2 diabetes mellitus with other diabetic neurological complication: Secondary | ICD-10-CM

## 2018-05-01 DIAGNOSIS — M109 Gout, unspecified: Secondary | ICD-10-CM | POA: Diagnosis not present

## 2018-05-01 DIAGNOSIS — Z8739 Personal history of other diseases of the musculoskeletal system and connective tissue: Secondary | ICD-10-CM

## 2018-05-01 DIAGNOSIS — R030 Elevated blood-pressure reading, without diagnosis of hypertension: Secondary | ICD-10-CM

## 2018-05-01 LAB — BASIC METABOLIC PANEL
BUN: 14 mg/dL (ref 6–23)
CALCIUM: 9.4 mg/dL (ref 8.4–10.5)
CO2: 26 mEq/L (ref 19–32)
CREATININE: 0.88 mg/dL (ref 0.40–1.50)
Chloride: 104 mEq/L (ref 96–112)
GFR: 101.39 mL/min (ref >60.00)
Glucose, Bld: 146 mg/dL — ABNORMAL HIGH (ref 70–99)
Potassium: 4.2 mEq/L (ref 3.5–5.1)
Sodium: 140 mEq/L (ref 135–145)

## 2018-05-01 LAB — URIC ACID: URIC ACID, SERUM: 4.8 mg/dL (ref 4.0–7.8)

## 2018-05-01 LAB — HEMOGLOBIN A1C: Hgb A1c MFr Bld: 6.6 % — ABNORMAL HIGH (ref 4.6–6.5)

## 2018-05-01 MED ORDER — ALLOPURINOL 100 MG PO TABS: 100.0000 mg | ORAL_TABLET | Freq: Every day | ORAL | 3 refills | Status: DC

## 2018-05-01 NOTE — Assessment & Plan Note (Signed)
Well-controlled. Continue allopurinol 100 mg daily, we discussed some side effects. Low purines diet also to continue.

## 2018-05-01 NOTE — Assessment & Plan Note (Addendum)
HgA1C was at goal, today A1c is pending. No changes in current management but will adjust medications according to A1c results. Regular exercise and healthy diet with avoidance of added sugar food intake is an important part of treatment and recommended. He needs an eye exam, recommend arranging appointment with eye care provider. Good foot care also recommended. F/U in 5-6 months

## 2018-05-01 NOTE — Patient Instructions (Addendum)
A few things to remember from today's visit:   Type II diabetes mellitus with neurological manifestations ([place]) - Plan: Basic metabolic panel, Hemoglobin A1c  Gouty arthropathy - Plan: Uric acid  No changes seen medications today. Continue working on weight loss, you have been losing weight steadily, which is great. Try to monitor blood pressure at home.  I will adjust diabetes medication according to A1c results.   Please be sure medication list is accurate. If a new problem present, please set up appointment sooner than planned today.

## 2018-05-01 NOTE — Assessment & Plan Note (Signed)
He has been losing weight steadily. Since his last visit in 11/2017 he has lost about 6 pounds. We discussed the benefits of weight loss. Encouraged to continue working on improving dietary habits as well as regular physical activity.

## 2018-05-04 ENCOUNTER — Encounter: Payer: Self-pay | Admitting: Family Medicine

## 2018-05-04 MED ORDER — LIRAGLUTIDE 18 MG/3ML ~~LOC~~ SOPN: 1.6000 mg | PEN_INJECTOR | Freq: Every day | SUBCUTANEOUS | 3 refills | Status: DC

## 2018-05-04 MED ORDER — EMPAGLIFLOZIN 10 MG PO TABS: 10.0000 mg | ORAL_TABLET | Freq: Every day | ORAL | 2 refills | Status: DC

## 2018-05-24 ENCOUNTER — Telehealth: Payer: Self-pay | Admitting: *Deleted

## 2018-05-24 DIAGNOSIS — E1149 Type 2 diabetes mellitus with other diabetic neurological complication: Secondary | ICD-10-CM

## 2018-05-24 NOTE — Telephone Encounter (Signed)
Prior auth for Victoza 18mg /68ml sent to [email or web address]-key [id number].

## 2018-05-26 NOTE — Telephone Encounter (Signed)
Fax received from [place] stating no prior Josem Kaufmann is needed as this is on the list of covered meds for the pt.  I called [facility] and informed Truman Hayward of this and he stated the pt needs a prior auth due to needing more than 2 pens per month.  I called the [facility] help desk at [phone] and spoke with Loma Sousa and informed her of this.  She stated the Rx is approved for 3 pens per month as of 05/26/2018-05/27/2019-auth #[id number] and to have the pharmacy resubmit tonight and I called the pharmacy and informed them of this.  Truman Hayward called back stating the dose is not accurate as the Victoza pen is only pre-calculated for a dose of 0.6, 1.2 or 1.8.  Message sent to Dr Doug Sou asst for a return call to the pharmacy.

## 2018-05-26 NOTE — Telephone Encounter (Signed)
I recommended increasing dose of Victoza from 1.2mg  to 1.6 mg (see lab message on 05/06/2018). Thanks, BJ

## 2018-05-26 NOTE — Telephone Encounter (Signed)
Message sent to Dr. Jordan for review. 

## 2018-05-29 ENCOUNTER — Ambulatory Visit: Payer: 59 | Admitting: Family Medicine

## 2018-05-29 ENCOUNTER — Encounter: Payer: Self-pay | Admitting: Family Medicine

## 2018-05-29 VITALS — BP 120/84 | HR 75 | Temp 98.3°F | Resp 12 | Ht 73.0 in | Wt 395.0 lb

## 2018-05-29 DIAGNOSIS — M545 Low back pain, unspecified: Secondary | ICD-10-CM

## 2018-05-29 LAB — POCT URINALYSIS DIPSTICK
Bilirubin, UA: NEGATIVE
Blood, UA: NEGATIVE
Glucose, UA: POSITIVE — AB
KETONES UA: NEGATIVE
Leukocytes, UA: NEGATIVE
NITRITE UA: NEGATIVE
Odor: NEGATIVE
PH UA: 6 (ref 5.0–8.0)
PROTEIN UA: POSITIVE — AB
Spec Grav, UA: 1.025 (ref 1.010–1.025)
UROBILINOGEN UA: 0.2 U/dL

## 2018-05-29 MED ORDER — LIRAGLUTIDE 18 MG/3ML ~~LOC~~ SOPN: 1.6000 mg | PEN_INJECTOR | Freq: Every day | SUBCUTANEOUS | 3 refills | Status: DC

## 2018-05-29 MED ORDER — CYCLOBENZAPRINE HCL 10 MG PO TABS: 10.0000 mg | ORAL_TABLET | Freq: Every day | ORAL | 0 refills | Status: AC

## 2018-05-29 MED ORDER — CELECOXIB 100 MG PO CAPS: 100.0000 mg | ORAL_CAPSULE | Freq: Two times a day (BID) | ORAL | 0 refills | Status: AC

## 2018-05-29 NOTE — Patient Instructions (Addendum)
A few things to remember from today's visit:   Right low back pain, unspecified chronicity, unspecified whether sciatica present - Plan: POCT urinalysis dipstick, celecoxib (CELEBREX) 100 MG capsule, cyclobenzaprine (FLEXERIL) 10 MG tablet   Back Pain, Adult Back pain is very common. The pain often gets better over time. The cause of back pain is usually not dangerous. Most people can learn to manage their back pain on their own. Follow these instructions at home: Watch your back pain for any changes. The following actions may help to lessen any pain you are feeling:  Stay active. Start with short walks on flat ground if you can. Try to walk farther each day.  Exercise regularly as told by your doctor. Exercise helps your back heal faster. It also helps avoid future injury by keeping your muscles strong and flexible.  Do not sit, drive, or stand in one place for more than 30 minutes.  Do not stay in bed. Resting more than 1-2 days can slow down your recovery.  Be careful when you bend or lift an object. Use good form when lifting: ? Bend at your knees. ? Keep the object close to your body. ? Do not twist.  Sleep on a firm mattress. Lie on your side, and bend your knees. If you lie on your back, put a pillow under your knees.  Take medicines only as told by your doctor.  Put ice on the injured area. ? Put ice in a plastic bag. ? Place a towel between your skin and the bag. ? Leave the ice on for 20 minutes, 2-3 times a day for the first 2-3 days. After that, you can switch between ice and heat packs.  Avoid feeling anxious or stressed. Find good ways to deal with stress, such as exercise.  Maintain a healthy weight. Extra weight puts stress on your back.  Contact a doctor if:  You have pain that does not go away with rest or medicine.  You have worsening pain that goes down into your legs or buttocks.  You have pain that does not get better in one week.  You have pain at  night.  You lose weight.  You have a fever or chills. Get help right away if:  You cannot control when you poop (bowel movement) or pee (urinate).  Your arms or legs feel weak.  Your arms or legs lose feeling (numbness).  You feel sick to your stomach (nauseous) or throw up (vomit).  You have belly (abdominal) pain.  You feel like you may pass out (faint). This information is not intended to replace advice given to you by your health care provider. Make sure you discuss any questions you have with your health care provider. Document Released: 01/12/2008 Document Revised: 01/01/2016 Document Reviewed: 11/27/2013 Elsevier Interactive Patient Education  [employer].  Please be sure medication list is accurate. If a new problem present, please set up appointment sooner than planned today.

## 2018-05-29 NOTE — Telephone Encounter (Signed)
Rx sent 

## 2018-05-29 NOTE — Progress Notes (Signed)
ACUTE VISIT   HPI:  Chief Complaint  Patient presents with  . Back Pain    right lower    Miguel Sanders is a 42 y.o. male, who is here today complaining of dry sided lower back pain. He has history of lower back pain but this one seems different to his usual back pain  He denies any recent injury or unusual level of activity.  Pain is not radiated, soreness/dull like, 4-5/10 in intensity, with no associated LE numbness, tingling, urinary incontinence or retention, stool incontinence, or saddle anesthesia. It seems to be worse at night. Exacerbated by prolonged rest.  Alleviated by movement,improved a few min after getting up. No rash or edema on area, fever, chills, or abnormal wt loss.  Prior Hx of back pain: Yes.   OTC medications: He takes ibuprofen for all other aches and pains. Pain does not interfere with daily activities or sleep.  Negative for gross hematuria or other urinary symptoms.  No history of nephrolithiasis.   Review of Systems  Constitutional: Negative for appetite change, chills, fatigue and fever.  Respiratory: Negative for shortness of breath and wheezing.   Cardiovascular: Negative for leg swelling.  Gastrointestinal: Negative for abdominal pain, nausea and vomiting.  Genitourinary: Negative for decreased urine volume, dysuria, hematuria and urgency.  Musculoskeletal: Positive for back pain. Negative for neck pain.  Skin: Negative for rash.  Neurological: Negative for weakness and numbness.      Current Outpatient Medications on File Prior to Visit  Medication Sig Dispense Refill  . allopurinol (ZYLOPRIM) 100 MG tablet Take 1 tablet (100 mg total) by mouth daily. 90 tablet 3  . atorvastatin (LIPITOR) 20 MG tablet Take 1 tablet (20 mg total) by mouth daily. Take 1 tablet by mouth daily with supper. 90 tablet 1  . empagliflozin (JARDIANCE) 10 MG TABS tablet Take 10 mg by mouth daily. 90 tablet 2  . Insulin Pen Needle 31G X 5 MM  MISC Use once daily 100 each 3  . MITIGARE 0.6 MG CAPS TAKE 1 TABLET BY MOUTH DAILY AS NEEDED. HOLD ATORVASTAIN WHEN TAKING THIS MEDICATION 30 capsule 3   No current facility-administered medications on file prior to visit.      Past Medical History:  Diagnosis Date  . Diabetes mellitus without complication ([place])   . Gout   . Obesity, Class III, BMI 40-49.9 (morbid obesity) ([facility])    No Known Allergies  Social History   Socioeconomic History  . Marital status: Married    Spouse name: Not on file  . Number of children: 2  . Years of education: Not on file  . Highest education level: Not on file  Occupational History  . Occupation: unemployed  Social Needs  . Financial resource strain: Not on file  . Food insecurity:    Worry: Not on file    Inability: Not on file  . Transportation needs:    Medical: Not on file    Non-medical: Not on file  Tobacco Use  . Smoking status: Former Smoker    Last attempt to quit: 08/09/2009    Years since quitting: 8.8  . Smokeless tobacco: Never Used  Substance and Sexual Activity  . Alcohol use: No  . Drug use: No  . Sexual activity: Yes  Lifestyle  . Physical activity:    Days per week: Not on file    Minutes per session: Not on file  . Stress: Not on file  Relationships  .  Social connections:    Talks on phone: Not on file    Gets together: Not on file    Attends religious service: Not on file    Active member of club or organization: Not on file    Attends meetings of clubs or organizations: Not on file    Relationship status: Not on file  Other Topics Concern  . Not on file  Social History Narrative   Marital status: married x 5 years. Separated from wife in 08/2012.     Children: 2 children from previous marriage; gets children every two weeks; sees more frequently.     Lives: alone; separated from wife in 08/2012.     Employment: Glass blower/designer.      Tobacco: quit; smoked x 13 years.      Alcohol: once every three months.       Drugs: none now; marijuana in past.      Exercise: started going to gym in 08/2012; going 4-5 days per week; elliptical and stationary bike.             Vitals:   05/29/18 0746  BP: 120/84  Pulse: 75  Resp: 12  Temp: 98.3 F (36.8 C)  SpO2: 95%   Body mass index is 52.11 kg/m.   Physical Exam  Nursing note and vitals reviewed. Constitutional: He is oriented to person, place, and time. He appears well-developed. No distress.  HENT:  Head: Normocephalic and atraumatic.  Mouth/Throat: Oropharynx is clear and moist and mucous membranes are normal.  Eyes: Conjunctivae and EOM are normal.  Cardiovascular: Normal rate and regular rhythm.  No murmur heard. Respiratory: Effort normal and breath sounds normal. No respiratory distress.  GI: Soft. He exhibits no mass. There is no hepatomegaly. There is no tenderness.  Musculoskeletal: He exhibits no edema.       Back:  No significant deformity appreciated. No tenderness upon palpation of paraspinal muscles. Pain elicited with movement on exam table during examination. No local edema or erythema appreciated, no suspicious lesions.   Neurological: He is alert and oriented to person, place, and time. He has normal strength.  Reflex Scores:      Patellar reflexes are 2+ on the right side and 2+ on the left side. SLR negative bilateral.  Skin: Skin is warm. No erythema.  Psychiatric: He has a normal mood and affect.  Well groomed,good eye contact.      ASSESSMENT AND PLAN:   Mr. Shaft was seen today for back pain.  Diagnoses and all orders for this visit:  Right low back pain, unspecified chronicity, unspecified whether sciatica present -     POCT urinalysis dipstick -     celecoxib (CELEBREX) 100 MG capsule; Take 1 capsule (100 mg total) by mouth 2 (two) times daily for 7 days. -     cyclobenzaprine (FLEXERIL) 10 MG tablet; Take 1 tablet (10 mg total) by mouth at bedtime for 21 days.   Reported as new problem but  has Hx of lower back pain.  Urine dipstick negative for blood. + Glucose and protein.  We discussed possible etiologies,most likely musculoskeletal. We discussed some side effects of NSAID's and muscle relaxants. OTC topical icy hot or asper cream with Lidocaine may also help. I do not think imaging is needed today.  Instructed about warning signs.   Return if symptoms worsen or fail to improve.       Betty G. Martinique, MD  [facility]. [place] office.

## 2018-05-31 ENCOUNTER — Telehealth: Payer: Self-pay | Admitting: Family Medicine

## 2018-05-31 NOTE — Telephone Encounter (Signed)
Message sent to Dr. Jordan for review and approval. 

## 2018-05-31 NOTE — Telephone Encounter (Signed)
Copied from [place] [phone]. Topic: Quick Communication - See Telephone Encounter >> May 31, 2018  3:07 PM Vernona Rieger wrote: CRM for notification. See Telephone encounter for: 05/31/18.  [facility] called and said that liraglutide (VICTOZA) 18 MG/3ML SOPN can only be dispensed in 0.6, 1.2 or 1.8. She said that they can only do 1.2 or 1.8. Can this script be wrote different? Call back @ [phone]

## 2018-06-01 ENCOUNTER — Encounter: Payer: Self-pay | Admitting: Family Medicine

## 2018-06-01 ENCOUNTER — Other Ambulatory Visit: Payer: Self-pay

## 2018-06-01 DIAGNOSIS — E1149 Type 2 diabetes mellitus with other diabetic neurological complication: Secondary | ICD-10-CM

## 2018-06-01 MED ORDER — LIRAGLUTIDE 18 MG/3ML ~~LOC~~ SOPN: 1.8000 mg | PEN_INJECTOR | Freq: Every day | SUBCUTANEOUS | 3 refills | Status: DC

## 2018-06-01 NOTE — Progress Notes (Signed)
Corrected and sent

## 2018-06-02 NOTE — Telephone Encounter (Signed)
Rx was re-sent on 06/01/18 by Rebecca Eaton, CMA.

## 2018-08-04 ENCOUNTER — Encounter: Payer: Self-pay | Admitting: Family Medicine

## 2018-08-04 ENCOUNTER — Ambulatory Visit: Payer: 59 | Admitting: Family Medicine

## 2018-08-04 VITALS — BP 140/80 | HR 93 | Temp 98.6°F | Wt 386.0 lb

## 2018-08-04 DIAGNOSIS — J019 Acute sinusitis, unspecified: Secondary | ICD-10-CM

## 2018-08-04 DIAGNOSIS — J029 Acute pharyngitis, unspecified: Secondary | ICD-10-CM

## 2018-08-04 LAB — POCT RAPID STREP A (OFFICE): Rapid Strep A Screen: NEGATIVE

## 2018-08-04 MED ORDER — AZITHROMYCIN 250 MG PO TABS: ORAL_TABLET | ORAL | 0 refills | Status: DC

## 2018-08-04 NOTE — Progress Notes (Signed)
   Subjective:    Patient ID: Edwin Baines, male    DOB: 1975/10/07, 42 y.o.   MRN: [id number]  HPI Here for 5 days of ST, PND, and a dry cough. No fever. He has a hx of frequent strep throats. Using Mucinex and Ibuprofen.    Review of Systems  Constitutional: Negative.   HENT: Positive for postnasal drip and sore throat. Negative for congestion, ear pain, sinus pressure and sinus pain.   Eyes: Negative.   Respiratory: Positive for cough.        Objective:   Physical Exam Constitutional:      Appearance: Normal appearance.  HENT:     Right Ear: Tympanic membrane and ear canal normal.     Left Ear: Tympanic membrane and ear canal normal.     Nose: Nose normal.     Mouth/Throat:     Pharynx: Oropharyngeal exudate and posterior oropharyngeal erythema present.  Eyes:     Conjunctiva/sclera: Conjunctivae normal.  Pulmonary:     Effort: Pulmonary effort is normal.     Breath sounds: Normal breath sounds.  Neurological:     Mental Status: He is alert.           Assessment & Plan:  Sinusitis, treat with a Zpack. Written out of work today.  Alysia Penna, MD

## 2018-09-09 ENCOUNTER — Other Ambulatory Visit: Payer: Self-pay | Admitting: Family Medicine

## 2018-10-17 ENCOUNTER — Other Ambulatory Visit: Payer: Self-pay | Admitting: Family Medicine

## 2018-10-17 DIAGNOSIS — E1149 Type 2 diabetes mellitus with other diabetic neurological complication: Secondary | ICD-10-CM

## 2018-10-30 ENCOUNTER — Ambulatory Visit: Payer: 59 | Admitting: Family Medicine

## 2018-10-30 ENCOUNTER — Encounter: Payer: Self-pay | Admitting: Family Medicine

## 2018-10-30 ENCOUNTER — Other Ambulatory Visit: Payer: Self-pay

## 2018-10-30 VITALS — BP 130/80 | HR 86 | Temp 98.6°F | Resp 12 | Ht 73.0 in | Wt 388.4 lb

## 2018-10-30 DIAGNOSIS — E1149 Type 2 diabetes mellitus with other diabetic neurological complication: Secondary | ICD-10-CM

## 2018-10-30 DIAGNOSIS — E1169 Type 2 diabetes mellitus with other specified complication: Secondary | ICD-10-CM

## 2018-10-30 DIAGNOSIS — R0981 Nasal congestion: Secondary | ICD-10-CM | POA: Diagnosis not present

## 2018-10-30 DIAGNOSIS — R945 Abnormal results of liver function studies: Secondary | ICD-10-CM

## 2018-10-30 DIAGNOSIS — Z6841 Body Mass Index (BMI) 40.0 and over, adult: Secondary | ICD-10-CM

## 2018-10-30 DIAGNOSIS — M109 Gout, unspecified: Secondary | ICD-10-CM

## 2018-10-30 DIAGNOSIS — E785 Hyperlipidemia, unspecified: Secondary | ICD-10-CM

## 2018-10-30 LAB — COMPREHENSIVE METABOLIC PANEL
ALT: 30 U/L (ref 0–53)
AST: 16 U/L (ref 0–37)
Albumin: 4 g/dL (ref 3.5–5.2)
Alkaline Phosphatase: 93 U/L (ref 39–117)
BUN: 11 mg/dL (ref 6–23)
CALCIUM: 9.4 mg/dL (ref 8.4–10.5)
CO2: 28 mEq/L (ref 19–32)
Chloride: 101 mEq/L (ref 96–112)
Creatinine, Ser: 0.88 mg/dL (ref 0.40–1.50)
GFR: 95.16 mL/min (ref >60.00)
Glucose, Bld: 116 mg/dL — ABNORMAL HIGH (ref 70–99)
Potassium: 4.3 mEq/L (ref 3.5–5.1)
SODIUM: 138 meq/L (ref 135–145)
Total Bilirubin: 0.5 mg/dL (ref 0.2–1.2)
Total Protein: 7.3 g/dL (ref 6.0–8.3)

## 2018-10-30 LAB — LIPID PANEL
CHOLESTEROL: 186 mg/dL (ref 0–200)
HDL: 32.1 mg/dL — ABNORMAL LOW (ref >39.00)
NonHDL: 153.77
Total CHOL/HDL Ratio: 6
Triglycerides: 308 mg/dL — ABNORMAL HIGH (ref 0.0–149.0)
VLDL: 61.6 mg/dL — ABNORMAL HIGH (ref 0.0–40.0)

## 2018-10-30 LAB — LDL CHOLESTEROL, DIRECT: Direct LDL: 109 mg/dL

## 2018-10-30 LAB — MICROALBUMIN / CREATININE URINE RATIO
Creatinine,U: 124.7 mg/dL
Microalb Creat Ratio: 4.6 mg/g (ref 0.0–30.0)
Microalb, Ur: 5.8 mg/dL — ABNORMAL HIGH (ref 0.0–1.9)

## 2018-10-30 LAB — HEMOGLOBIN A1C: HEMOGLOBIN A1C: 6.2 % (ref 4.6–6.5)

## 2018-10-30 MED ORDER — EMPAGLIFLOZIN 10 MG PO TABS: 10.0000 mg | ORAL_TABLET | Freq: Every day | ORAL | 2 refills | Status: DC

## 2018-10-30 MED ORDER — ATORVASTATIN CALCIUM 20 MG PO TABS: 20.0000 mg | ORAL_TABLET | Freq: Every day | ORAL | 2 refills | Status: DC

## 2018-10-30 MED ORDER — FLUTICASONE PROPIONATE 50 MCG/ACT NA SUSP: 2.0000 | Freq: Every day | NASAL | 6 refills | Status: DC

## 2018-10-30 NOTE — Patient Instructions (Addendum)
A few things to remember from today's visit:   Type II diabetes mellitus with neurological manifestations ([place]) - Plan: Comprehensive metabolic panel, Hemoglobin A1c, Microalbumin / creatinine urine ratio  Hyperlipidemia associated with type 2 diabetes mellitus ([place]) - Plan: Lipid panel Try to take cholesterol medication in the morning. No changes in rest of your medications.  Nasal saline irrigations several times per day as needed. Start Flonase nasal spray daily. Consider over-the-counter decongestant for 7 to 10 days then you can continue with plain antihistaminic like Zyrtec 10 mg daily or Allegra 180 mg daily. Monitor for fever.   Please be sure medication list is accurate. If a new problem present, please set up appointment sooner than planned today.

## 2018-10-30 NOTE — Progress Notes (Signed)
HPI:   [name] is a 43 y.o. male, who is here today for chronic disease management.  He was last seen on 05/29/2018.  DM 2: Currently he is on Victoza 1.8 mg daily and Jardiance 10 mg daily. He has tolerated medication well. He is not checking BS at home. Denies abdominal pain, nausea,vomiting, polydipsia,polyuria, or polyphagia.   Lab Results  Component Value Date   HGBA1C 6.6 (H) 05/01/2018   Lab Results  Component Value Date   MICROALBUR 4.4 (H) 09/02/2017   He has had elevated LFTs. He denies abdominal pain, nausea, vomiting.  Lab Results  Component Value Date   ALT 29 11/29/2017   AST 16 11/29/2017   ALKPHOS 82 11/29/2017   BILITOT 0.5 11/29/2017   Hyperlipidemia: He is forgetting taking his medication frequently. Currently he is on atorvastatin 20 mg daily. He is tolerating medication well. He has not been consistent with dietary recommendations.  Lab Results  Component Value Date   CHOL 137 11/29/2017   HDL 31.00 (L) 11/29/2017   LDLCALC 79 10/10/2012   LDLDIRECT 76.0 11/29/2017   TRIG 221.0 (H) 11/29/2017   CHOLHDL 4 11/29/2017   Gout: He is currently on allopurinol 100 mg daily. He has not had an acute gout episode. Occasionally he feels some achy joints, in which case he takes colchicine 0.6 mg x 1.  Today he is complaining about "head cold." + Seasonal allergies. Sinus pressure, nasal congestion, rhinorrhea, and postnasal drainage. He denies sick contacts, recent travel, or visitors from overseas. Denies fever, chills, body aches, or unusual fatigue. Negative for cough, dyspnea, or wheezing.   Review of Systems  Constitutional: Negative for activity change, appetite change, fatigue, fever and unexpected weight change.  HENT: Positive for congestion, postnasal drip, rhinorrhea and sinus pressure. Negative for nosebleeds, sore throat and trouble swallowing.   Eyes: Negative for redness and visual disturbance.   Respiratory: Negative for cough, shortness of breath and wheezing.   Cardiovascular: Negative for chest pain, palpitations and leg swelling.  Gastrointestinal: Negative for abdominal pain, nausea and vomiting.  Genitourinary: Negative for decreased urine volume and hematuria.  Musculoskeletal: Positive for arthralgias (occasional).  Skin: Negative for rash and wound.  Neurological: Negative for dizziness, weakness and headaches.    Current Outpatient Medications on File Prior to Visit  Medication Sig Dispense Refill  . allopurinol (ZYLOPRIM) 100 MG tablet Take 1 tablet (100 mg total) by mouth daily. 90 tablet 3  . Insulin Pen Needle (B-D UF III MINI PEN NEEDLES) 31G X 5 MM MISC USE ONCE DAILY 100 each 3  . MITIGARE 0.6 MG CAPS TAKE 1 TABLET BY MOUTH DAILY AS NEEDED. HOLD ATORVASTAIN WHEN TAKING THIS MEDICATION 30 capsule 3  . VICTOZA 18 MG/3ML SOPN ADMINISTER 1.8 MG UNDER THE SKIN DAILY 9 mL 3   No current facility-administered medications on file prior to visit.      Past Medical History:  Diagnosis Date  . Diabetes mellitus without complication ([place])   . Gout   . Obesity, Class III, BMI 40-49.9 (morbid obesity) ([facility])    No Known Allergies  Social History   Socioeconomic History  . Marital status: Married    Spouse name: Not on file  . Number of children: 2  . Years of education: Not on file  . Highest education level: Not on file  Occupational History  . Occupation: unemployed  Social Needs  . Financial resource strain: Not on file  . Food insecurity:  Worry: Not on file    Inability: Not on file  . Transportation needs:    Medical: Not on file    Non-medical: Not on file  Tobacco Use  . Smoking status: Former Smoker    Last attempt to quit: 08/09/2009    Years since quitting: 9.2  . Smokeless tobacco: Never Used  Substance and Sexual Activity  . Alcohol use: No  . Drug use: No  . Sexual activity: Yes  Lifestyle  . Physical activity:    Days per week: Not  on file    Minutes per session: Not on file  . Stress: Not on file  Relationships  . Social connections:    Talks on phone: Not on file    Gets together: Not on file    Attends religious service: Not on file    Active member of club or organization: Not on file    Attends meetings of clubs or organizations: Not on file    Relationship status: Not on file  Other Topics Concern  . Not on file  Social History Narrative   Marital status: married x 5 years. Separated from wife in 08/2012.     Children: 2 children from previous marriage; gets children every two weeks; sees more frequently.     Lives: alone; separated from wife in 08/2012.     Employment: Glass blower/designer.      Tobacco: quit; smoked x 13 years.      Alcohol: once every three months.      Drugs: none now; marijuana in past.      Exercise: started going to gym in 08/2012; going 4-5 days per week; elliptical and stationary bike.             Vitals:   10/30/18 0715  BP: 130/80  Pulse: 86  Resp: 12  Temp: 98.6 F (37 C)  SpO2: 97%   Body mass index is 51.24 kg/m.   Wt Readings from Last 3 Encounters:  10/30/18 (!) 388 lb 6.4 oz (176.2 kg)  08/04/18 (!) 386 lb (175.1 kg)  05/29/18 (!) 395 lb (179.2 kg)     Physical Exam  Nursing note reviewed. Constitutional: He is oriented to person, place, and time. He appears well-developed. No distress.  HENT:  Head: Normocephalic and atraumatic.  Right Ear: Tympanic membrane, external ear and ear canal normal.  Left Ear: Tympanic membrane, external ear and ear canal normal.  Nose: Right sinus exhibits no maxillary sinus tenderness and no frontal sinus tenderness. Left sinus exhibits no maxillary sinus tenderness and no frontal sinus tenderness.  Mouth/Throat: Oropharynx is clear and moist and mucous membranes are normal.  Dry nasal mucosa. Normal sinus transillumination. Postnasal drainage.   Eyes: Pupils are equal, round, and reactive to light. Conjunctivae are  normal.  Cardiovascular: Normal rate and regular rhythm.  No murmur heard. Pulses:      Dorsalis pedis pulses are 2+ on the right side and 2+ on the left side.  Respiratory: Effort normal and breath sounds normal. No respiratory distress.  GI: Soft. He exhibits no mass. There is no hepatomegaly. There is no abdominal tenderness.  Musculoskeletal:        General: No edema.  Lymphadenopathy:    He has no cervical adenopathy.  Neurological: He is alert and oriented to person, place, and time. He has normal strength. No cranial nerve deficit. Gait normal.  Skin: Skin is warm. No rash noted. No erythema.  Psychiatric: He has a normal mood and affect.  Cognition and memory are normal.  Well groomed, good eye contact.   Diabetic Foot Exam - Simple   Simple Foot Form Diabetic Foot exam was performed with the following findings:  Yes 10/30/2018  7:29 AM  Visual Inspection No deformities, no ulcerations, no other skin breakdown bilaterally:  Yes Sensation Testing Intact to touch and monofilament testing bilaterally:  Yes Pulse Check Posterior Tibialis and Dorsalis pulse intact bilaterally:  Yes Comments    ASSESSMENT AND PLAN:   [name] was seen today for chronic disease management.  Orders Placed This Encounter  Procedures  . Comprehensive metabolic panel  . Hemoglobin A1c  . Microalbumin / creatinine urine ratio  . Lipid panel  . LDL cholesterol, direct   Lab Results  Component Value Date   CHOL 186 10/30/2018   HDL 32.10 (L) 10/30/2018   LDLCALC 79 10/10/2012   LDLDIRECT 109.0 10/30/2018   TRIG 308.0 (H) 10/30/2018   CHOLHDL 6 10/30/2018   Lab Results  Component Value Date   HGBA1C 6.2 10/30/2018   Lab Results  Component Value Date   MICROALBUR 5.8 (H) 10/30/2018   Lab Results  Component Value Date   ALT 30 10/30/2018   AST 16 10/30/2018   ALKPHOS 93 10/30/2018   BILITOT 0.5 10/30/2018     Morbid obesity with BMI of 50.0-59.9, adult ([place]) We  discussed benefits of wt loss as well as adverse effects of obesity. Consistency with healthy diet and physical activity recommended.    Hyperlipidemia associated with type 2 diabetes mellitus ([place])  He feels like it will be easier to take atorvastatin with the rest of the medication in the morning, so he will continue doing so. Low-fat diet also recommended. Further recommendation will be given according to lipid panel results.  Gouty arthropathy Probably has been well controlled since starting allopurinol. No changes in current management. Continue low purine diet.  Type II diabetes mellitus with neurological manifestations ([place]) HgA1C pending today. No changes in current management, will adjust medications according to A1c results. Regular exercise and healthy diet with avoidance of added sugar food intake is an important part of treatment and recommended. Annual eye exam, periodic dental and foot care recommended. F/U in 5-6 months  Liver function study, abnormal We will continue following. Low-fat diet and weight loss recommended. No changes in atorvastatin dose.   Return in about 6 months (around 05/02/2019).     Olando Willems G. Martinique, MD  [facility]. [place] office.

## 2018-10-30 NOTE — Assessment & Plan Note (Signed)
HgA1C pending today. No changes in current management, will adjust medications according to A1c results. Regular exercise and healthy diet with avoidance of added sugar food intake is an important part of treatment and recommended. Annual eye exam, periodic dental and foot care recommended. F/U in 5-6 months

## 2018-10-30 NOTE — Assessment & Plan Note (Signed)
Probably has been well controlled since starting allopurinol. No changes in current management. Continue low purine diet.

## 2018-10-30 NOTE — Assessment & Plan Note (Addendum)
  He feels like it will be easier to take atorvastatin with the rest of the medication in the morning, so he will continue doing so. Low-fat diet also recommended. Further recommendation will be given according to lipid panel results.

## 2018-10-30 NOTE — Assessment & Plan Note (Signed)
We will continue following. Low-fat diet and weight loss recommended. No changes in atorvastatin dose.

## 2018-10-30 NOTE — Assessment & Plan Note (Signed)
We discussed benefits of wt loss as well as adverse effects of obesity. Consistency with healthy diet and physical activity recommended.  

## 2019-02-23 ENCOUNTER — Other Ambulatory Visit: Payer: Self-pay | Admitting: Family Medicine

## 2019-02-23 DIAGNOSIS — E1149 Type 2 diabetes mellitus with other diabetic neurological complication: Secondary | ICD-10-CM

## 2019-05-02 ENCOUNTER — Ambulatory Visit: Payer: 59 | Admitting: Family Medicine

## 2019-05-02 DIAGNOSIS — Z0289 Encounter for other administrative examinations: Secondary | ICD-10-CM

## 2019-05-15 ENCOUNTER — Other Ambulatory Visit: Payer: Self-pay

## 2019-05-15 ENCOUNTER — Encounter: Payer: Self-pay | Admitting: Family Medicine

## 2019-05-15 ENCOUNTER — Ambulatory Visit: Payer: 59 | Admitting: Family Medicine

## 2019-05-15 VITALS — BP 122/80 | HR 82 | Temp 98.6°F | Resp 16 | Ht 73.0 in | Wt 388.4 lb

## 2019-05-15 DIAGNOSIS — E1149 Type 2 diabetes mellitus with other diabetic neurological complication: Secondary | ICD-10-CM

## 2019-05-15 DIAGNOSIS — E1169 Type 2 diabetes mellitus with other specified complication: Secondary | ICD-10-CM

## 2019-05-15 DIAGNOSIS — E785 Hyperlipidemia, unspecified: Secondary | ICD-10-CM

## 2019-05-15 DIAGNOSIS — M109 Gout, unspecified: Secondary | ICD-10-CM | POA: Diagnosis not present

## 2019-05-15 DIAGNOSIS — Z6841 Body Mass Index (BMI) 40.0 and over, adult: Secondary | ICD-10-CM

## 2019-05-15 LAB — LIPID PANEL
Cholesterol: 178 mg/dL (ref 0–200)
HDL: 31.6 mg/dL — ABNORMAL LOW (ref >39.00)
NonHDL: 146.18
Total CHOL/HDL Ratio: 6
Triglycerides: 251 mg/dL — ABNORMAL HIGH (ref 0.0–149.0)
VLDL: 50.2 mg/dL — ABNORMAL HIGH (ref 0.0–40.0)

## 2019-05-15 LAB — BASIC METABOLIC PANEL
BUN: 13 mg/dL (ref 6–23)
CO2: 28 mEq/L (ref 19–32)
Calcium: 9.8 mg/dL (ref 8.4–10.5)
Chloride: 102 mEq/L (ref 96–112)
Creatinine, Ser: 0.89 mg/dL (ref 0.40–1.50)
GFR: 93.68 mL/min (ref >60.00)
Glucose, Bld: 137 mg/dL — ABNORMAL HIGH (ref 70–99)
Potassium: 4.4 mEq/L (ref 3.5–5.1)
Sodium: 139 mEq/L (ref 135–145)

## 2019-05-15 LAB — HEMOGLOBIN A1C: Hgb A1c MFr Bld: 6.6 % — ABNORMAL HIGH (ref 4.6–6.5)

## 2019-05-15 LAB — LDL CHOLESTEROL, DIRECT: Direct LDL: 109 mg/dL

## 2019-05-15 MED ORDER — EMPAGLIFLOZIN 10 MG PO TABS: 10.0000 mg | ORAL_TABLET | Freq: Every day | ORAL | 2 refills | Status: DC

## 2019-05-15 MED ORDER — VICTOZA 18 MG/3ML ~~LOC~~ SOPN: PEN_INJECTOR | SUBCUTANEOUS | 3 refills | Status: DC

## 2019-05-15 NOTE — Progress Notes (Addendum)
HPI:   Miguel Sanders is a 43 y.o. male, who is here today for chronic disease management. He was last seen on 10/30/2018.  -Gout: Currently he is on allopurinol 100 mg daily. He is tolerating medication well. He has no joint pain, erythema, or edema.  Tolerating medication well.  -He is not exercising regularly and has not been consistent with following a healthful diet.  -DM II: Currently he is on Victoza 1.8 mg daily and Jardiance 10 mg daily. He has tolerated medication well. BS's 130-180's FG and 200's post prandial.  Denies abdominal pain, nausea,vomiting, polydipsia,polyuria, or polyphagia. Left foot tingling and numbness has been a stable.   Lab Results  Component Value Date   HGBA1C 6.2 10/30/2018   Lab Results  Component Value Date   CREATININE 0.88 10/30/2018   BUN 11 10/30/2018   NA 138 10/30/2018   K 4.3 10/30/2018   CL 101 10/30/2018   CO2 28 10/30/2018    Lab Results  Component Value Date   ALT 30 10/30/2018   AST 16 10/30/2018   ALKPHOS 93 10/30/2018   BILITOT 0.5 10/30/2018   -Hyperlipidemia: He discontinue atorvastatin after a few days because it was causing nausea. He was taking it in the morning with the rest of his medications. He has not been consistent with following low-fat diet.  Lab Results  Component Value Date   CHOL 186 10/30/2018   HDL 32.10 (L) 10/30/2018   LDLCALC 79 10/10/2012   LDLDIRECT 109.0 10/30/2018   TRIG 308.0 (H) 10/30/2018   CHOLHDL 6 10/30/2018    Review of Systems  Constitutional: Negative for activity change, appetite change, fatigue, fever and unexpected weight change.  HENT: Negative for nosebleeds and sore throat.   Eyes: Negative for redness and visual disturbance.  Respiratory: Negative for cough, shortness of breath and wheezing.   Cardiovascular: Negative for chest pain, palpitations and leg swelling.  Gastrointestinal:       Changes in bowel habits.  Genitourinary: Negative for  decreased urine volume, dysuria and hematuria.  Skin: Negative for rash and wound.  Neurological: Negative for syncope, weakness and headaches.  Rest see pertinent positives and negatives per HPI.   Current Outpatient Medications on File Prior to Visit  Medication Sig Dispense Refill  . allopurinol (ZYLOPRIM) 100 MG tablet Take 1 tablet (100 mg total) by mouth daily. 90 tablet 3  . fluticasone (FLONASE) 50 MCG/ACT nasal spray Place 2 sprays into both nostrils daily. 16 g 6  . Insulin Pen Needle (B-D UF III MINI PEN NEEDLES) 31G X 5 MM MISC USE ONCE DAILY 100 each 3  . MITIGARE 0.6 MG CAPS TAKE 1 TABLET BY MOUTH DAILY AS NEEDED. HOLD ATORVASTAIN WHEN TAKING THIS MEDICATION 30 capsule 3   No current facility-administered medications on file prior to visit.      Past Medical History:  Diagnosis Date  . Diabetes mellitus without complication ([place])   . Gout   . Obesity, Class III, BMI 40-49.9 (morbid obesity) (HCC)    No Known Allergies  Social History   Socioeconomic History  . Marital status: Married    Spouse name: Not on file  . Number of children: 2  . Years of education: Not on file  . Highest education level: Not on file  Occupational History  . Occupation: unemployed  Social Needs  . Financial resource strain: Not on file  . Food insecurity    Worry: Not on file    Inability:  Not on file  . Transportation needs    Medical: Not on file    Non-medical: Not on file  Tobacco Use  . Smoking status: Former Smoker    Quit date: 08/09/2009    Years since quitting: 9.7  . Smokeless tobacco: Never Used  Substance and Sexual Activity  . Alcohol use: No  . Drug use: No  . Sexual activity: Yes  Lifestyle  . Physical activity    Days per week: Not on file    Minutes per session: Not on file  . Stress: Not on file  Relationships  . Social Herbalist on phone: Not on file    Gets together: Not on file    Attends religious service: Not on file    Active member  of club or organization: Not on file    Attends meetings of clubs or organizations: Not on file    Relationship status: Not on file  Other Topics Concern  . Not on file  Social History Narrative   Marital status: married x 5 years. Separated from wife in 08/2012.     Children: 2 children from previous marriage; gets children every two weeks; sees more frequently.     Lives: alone; separated from wife in 08/2012.     Employment: Glass blower/designer.      Tobacco: quit; smoked x 13 years.      Alcohol: once every three months.      Drugs: none now; marijuana in past.      Exercise: started going to gym in 08/2012; going 4-5 days per week; elliptical and stationary bike.             Vitals:   05/15/19 0754  BP: 122/80  Pulse: 82  Resp: 16  Temp: 98.6 F (37 C)  SpO2: 96%   Body mass index is 51.24 kg/m.  Wt Readings from Last 3 Encounters:  05/15/19 (!) 388 lb 6 oz (176.2 kg)  10/30/18 (!) 388 lb 6.4 oz (176.2 kg)  08/04/18 (!) 386 lb (175.1 kg)     Physical Exam  Nursing note and vitals reviewed. Constitutional: He is oriented to person, place, and time. He appears well-developed. No distress.  HENT:  Head: Normocephalic and atraumatic.  Mouth/Throat: Oropharynx is clear and moist and mucous membranes are normal.  Eyes: Pupils are equal, round, and reactive to light. Conjunctivae are normal.  Cardiovascular: Normal rate and regular rhythm.  No murmur heard. Pulses:      Dorsalis pedis pulses are 2+ on the right side and 2+ on the left side.  Respiratory: Effort normal and breath sounds normal. No respiratory distress.  GI: Soft. He exhibits no mass. There is no hepatomegaly. There is no abdominal tenderness.  Musculoskeletal:        General: No edema.  Lymphadenopathy:    He has no cervical adenopathy.  Neurological: He is alert and oriented to person, place, and time. He has normal strength. No cranial nerve deficit. Gait normal.  Skin: Skin is warm. No rash noted. No  erythema.  Psychiatric: Cognition and memory are normal.  Flat mood. Well groomed, good eye contact.    ASSESSMENT AND PLAN:  Miguel Sanders was seen today for follow-up.  Diagnoses and all orders for this visit:  Lab Results  Component Value Date   HGBA1C 6.6 (H) 05/15/2019   Lab Results  Component Value Date   CHOL 178 05/15/2019   HDL 31.60 (L) 05/15/2019   LDLCALC 79 10/10/2012  LDLDIRECT 109.0 05/15/2019   TRIG 251.0 (H) 05/15/2019   CHOLHDL 6 05/15/2019   Lab Results  Component Value Date   CREATININE 0.89 05/15/2019   BUN 13 05/15/2019   NA 139 05/15/2019   K 4.4 05/15/2019   CL 102 05/15/2019   CO2 28 05/15/2019    Type II diabetes mellitus with neurological manifestations ([place]) HgA1C has been at goal. No changes in current management. Regular exercise and healthy diet with avoidance of added sugar food intake is an important part of treatment and recommended. Annual eye exam, periodic dental and foot care recommended. F/U in 5-6 months  -     Basic metabolic panel -     Hemoglobin A1c -     Fructosamine -     liraglutide (VICTOZA) 18 MG/3ML SOPN; ADMINISTER 1.8 MG UNDER THE SKIN DAILY -     empagliflozin (JARDIANCE) 10 MG TABS tablet; Take 10 mg by mouth daily.  Hyperlipidemia associated with type 2 diabetes mellitus ([place]) He did not tolerate Atorvastatin well. Pravastatin will be recommend,dose depends of lipid panel results.  -     Lipid panel  Gouty arthropathy Well controlled. He has not needed Colchicine for acute episodes. Low purine diet recommended. No changes in Allopurinol.  Morbid obesity with BMI of 50.0-59.9, adult ([place]) We discussed benefits of wt loss as well as adverse effects of obesity. Consistency with healthy diet and physical activity recommended. Wt has been stable.  Other orders -     LDL cholesterol, direct    Return in about 6 months (around 11/13/2019) for DM II,HLD.    Enyla Lisbon G. [place], MD  [facility].  [place] office.

## 2019-05-15 NOTE — Patient Instructions (Signed)
A few things to remember from today's visit:   Hyperlipidemia associated with type 2 diabetes mellitus ([place]) - Plan: Lipid panel  Type II diabetes mellitus with neurological manifestations ([place]) - Plan: Basic metabolic panel, Hemoglobin A1c, Fructosamine  Gouty arthropathy  No changes today.  We can consider trying pravastatin instead atorvastatin for cholesterol.  Please be sure medication list is accurate. If a new problem present, please set up appointment sooner than planned today.

## 2019-05-17 LAB — FRUCTOSAMINE: Fructosamine: 248 umol/L (ref 205–285)

## 2019-05-17 MED ORDER — PRAVASTATIN SODIUM 40 MG PO TABS: 40.0000 mg | ORAL_TABLET | Freq: Every day | ORAL | 3 refills | Status: DC

## 2019-05-18 ENCOUNTER — Other Ambulatory Visit: Payer: Self-pay | Admitting: Family Medicine

## 2019-05-18 DIAGNOSIS — Z8739 Personal history of other diseases of the musculoskeletal system and connective tissue: Secondary | ICD-10-CM

## 2019-06-27 ENCOUNTER — Other Ambulatory Visit: Payer: Self-pay | Admitting: Family Medicine

## 2019-06-27 ENCOUNTER — Encounter: Payer: Self-pay | Admitting: Family Medicine

## 2019-06-27 DIAGNOSIS — E1149 Type 2 diabetes mellitus with other diabetic neurological complication: Secondary | ICD-10-CM

## 2019-06-27 MED ORDER — VICTOZA 18 MG/3ML ~~LOC~~ SOPN: PEN_INJECTOR | SUBCUTANEOUS | 3 refills | Status: DC

## 2019-06-27 NOTE — Telephone Encounter (Signed)
Medication Refill - Medication:  liraglutide (VICTOZA) 18 MG/3ML SOPN   Has the patient contacted their pharmacy? Yes advised to call office. Pharmacy has sent 2 requests. Pt only has one shot left.   Preferred Pharmacy (with phone number or street name):  [facility], [place] [phone] (Phone) [phone] (Fax)     Agent: Please be advised that RX refills may take up to 3 business days. We ask that you follow-up with your pharmacy.

## 2019-06-27 NOTE — Telephone Encounter (Signed)
Requested Prescriptions  Pending Prescriptions Disp Refills  . liraglutide (VICTOZA) 18 MG/3ML SOPN 9 mL 3    Sig: ADMINISTER 1.8 MG UNDER THE SKIN DAILY     Endocrinology:  Diabetes - GLP-1 Receptor Agonists Passed - 06/27/2019  8:08 AM      Passed - HBA1C is between 0 and 7.9 and within 180 days    Hgb A1c MFr Bld  Date Value Ref Range Status  05/15/2019 6.6 (H) 4.6 - 6.5 % Final    Comment:    Glycemic Control Guidelines for People with Diabetes:Non Diabetic:  <6%Goal of Therapy: <7%Additional Action Suggested:  >8%          Passed - Valid encounter within last 6 months    Recent Outpatient Visits          1 month ago Type II diabetes mellitus with neurological manifestations ([place])   [facility] at [facility] [place], Malka So, MD   8 months ago Type II diabetes mellitus with neurological manifestations ([place])   [facility]rap[facility] at [facility] [place], Malka So, MD   10 months ago Acute sinusitis, recurrence not specified, unspecified location   [facility], Ishmael Holter, MD   1 year ago Right low back pain, unspecified chronicity, unspecified whether sciatica present   Therapist, [facility] at [facility] [place], Malka So, MD   1 year ago Type II diabetes mellitus with neurological manifestations ([place])   [place], Malka So, MD      Future Appointments            In 4 months [place], Malka So, MD [employer] [facility] at [place], [facility]

## 2019-07-03 ENCOUNTER — Encounter: Payer: Self-pay | Admitting: Family Medicine

## 2019-07-03 NOTE — Telephone Encounter (Signed)
PA has been resent to cover my meds. Miguel Sanders (Key: [id number]) Rx #: [id number] Victoza 18MG [name] pen-injectors

## 2019-08-26 ENCOUNTER — Encounter: Payer: Self-pay | Admitting: Family Medicine

## 2019-09-26 ENCOUNTER — Other Ambulatory Visit: Payer: Self-pay | Admitting: Family Medicine

## 2019-09-26 DIAGNOSIS — E1149 Type 2 diabetes mellitus with other diabetic neurological complication: Secondary | ICD-10-CM

## 2019-09-30 ENCOUNTER — Other Ambulatory Visit: Payer: Self-pay | Admitting: Family Medicine

## 2019-10-31 ENCOUNTER — Other Ambulatory Visit: Payer: Self-pay

## 2019-10-31 ENCOUNTER — Encounter: Payer: Self-pay | Admitting: Family Medicine

## 2019-10-31 ENCOUNTER — Ambulatory Visit (INDEPENDENT_AMBULATORY_CARE_PROVIDER_SITE_OTHER): Payer: 59 | Admitting: Family Medicine

## 2019-10-31 VITALS — BP 128/76 | HR 86 | Temp 97.6°F | Wt 387.6 lb

## 2019-10-31 DIAGNOSIS — B3742 Candidal balanitis: Secondary | ICD-10-CM | POA: Diagnosis not present

## 2019-10-31 MED ORDER — FLUCONAZOLE 100 MG PO TABS: 100.0000 mg | ORAL_TABLET | Freq: Every day | ORAL | 0 refills | Status: DC

## 2019-10-31 MED ORDER — CICLOPIROX OLAMINE 0.77 % EX CREA: TOPICAL_CREAM | Freq: Two times a day (BID) | CUTANEOUS | 1 refills | Status: DC

## 2019-10-31 NOTE — Patient Instructions (Signed)
Leave off the Neosporin  Keep the area as dry as possible  Use the Loprox twice daily and be in touch if not better in two weeks.

## 2019-10-31 NOTE — Progress Notes (Signed)
  Subjective:     Patient ID: Miguel Sanders, male   DOB: [birth year], 44 y.o.   MRN: [id number]  HPI Samyar seen with approximately 1-1/2-week history of some penis rash.  This is itching and burning.  No history of similar rash.  He is uncircumcised but because of his size his penis retracts up inward and retains a lot of moisture.  He tried some Neosporin without improvement.  He does have some involvement of the shaft of the penis as well with itching and burning.  He does have type 2 diabetes.  Is also on Jardiance which increases risk of yeast infections.  No recent prednisone or antibiotic use  Past Medical History:  Diagnosis Date  . Diabetes mellitus without complication ([place])   . Gout   . Obesity, Class III, BMI 40-49.9 (morbid obesity) ([place])    Past Surgical History:  Procedure Laterality Date  . BACK SURGERY    . SPINE SURGERY      reports that he quit smoking about 10 years ago. He has never used smokeless tobacco. He reports that he does not drink alcohol or use drugs. family history includes Arthritis in his mother; Cancer in an other family member; Colon cancer (age of onset: 50) in his maternal grandfather; Hypertension in an other family member. No Known Allergies   Review of Systems  Constitutional: Negative for chills and fever.  Genitourinary: Negative for dysuria.  Skin: Positive for rash.       Objective:   Physical Exam Vitals reviewed.  Constitutional:      Appearance: Normal appearance.  Cardiovascular:     Rate and Rhythm: Normal rate and regular rhythm.  Skin:    Comments: He has rash involving the glans and shaft of the penis.  He has significant erythema and a few areas where the skin is cracking superficially.  Neurological:     Mental Status: He is alert.        Assessment:     Probable Candida skin rash involving the glans and penis.  He has risk factors including diabetes, Jardiance, obesity    Plan:     -Leave off  Neosporin -Keep area dry as possible -Loprox cream twice daily -Fluconazole 100 mg once daily for 3 days -Follow-up with primary in 1 to 2 weeks if not clearing  Eulas Post MD [facility]

## 2019-11-12 ENCOUNTER — Other Ambulatory Visit: Payer: Self-pay

## 2019-11-12 ENCOUNTER — Ambulatory Visit: Payer: 59 | Admitting: Family Medicine

## 2019-11-12 ENCOUNTER — Encounter: Payer: Self-pay | Admitting: Family Medicine

## 2019-11-12 VITALS — BP 136/94 | HR 84 | Temp 97.4°F | Resp 16 | Ht 73.0 in | Wt 392.0 lb

## 2019-11-12 DIAGNOSIS — E1149 Type 2 diabetes mellitus with other diabetic neurological complication: Secondary | ICD-10-CM | POA: Diagnosis not present

## 2019-11-12 DIAGNOSIS — R030 Elevated blood-pressure reading, without diagnosis of hypertension: Secondary | ICD-10-CM

## 2019-11-12 DIAGNOSIS — E1169 Type 2 diabetes mellitus with other specified complication: Secondary | ICD-10-CM

## 2019-11-12 DIAGNOSIS — E785 Hyperlipidemia, unspecified: Secondary | ICD-10-CM | POA: Diagnosis not present

## 2019-11-12 DIAGNOSIS — Z6841 Body Mass Index (BMI) 40.0 and over, adult: Secondary | ICD-10-CM

## 2019-11-12 LAB — LDL CHOLESTEROL, DIRECT: Direct LDL: 91 mg/dL

## 2019-11-12 LAB — COMPREHENSIVE METABOLIC PANEL
ALT: 25 U/L (ref 0–53)
AST: 16 U/L (ref 0–37)
Albumin: 4 g/dL (ref 3.5–5.2)
Alkaline Phosphatase: 85 U/L (ref 39–117)
BUN: 14 mg/dL (ref 6–23)
CO2: 28 mEq/L (ref 19–32)
Calcium: 9.3 mg/dL (ref 8.4–10.5)
Chloride: 102 mEq/L (ref 96–112)
Creatinine, Ser: 0.92 mg/dL (ref 0.40–1.50)
GFR: 89.95 mL/min (ref >60.00)
Glucose, Bld: 143 mg/dL — ABNORMAL HIGH (ref 70–99)
Potassium: 4.4 mEq/L (ref 3.5–5.1)
Sodium: 139 mEq/L (ref 135–145)
Total Bilirubin: 0.4 mg/dL (ref 0.2–1.2)
Total Protein: 6.8 g/dL (ref 6.0–8.3)

## 2019-11-12 LAB — LIPID PANEL
Cholesterol: 163 mg/dL (ref 0–200)
HDL: 34.8 mg/dL — ABNORMAL LOW (ref >39.00)
NonHDL: 127.91
Total CHOL/HDL Ratio: 5
Triglycerides: 276 mg/dL — ABNORMAL HIGH (ref 0.0–149.0)
VLDL: 55.2 mg/dL — ABNORMAL HIGH (ref 0.0–40.0)

## 2019-11-12 LAB — POCT GLYCOSYLATED HEMOGLOBIN (HGB A1C): HbA1c, POC (controlled diabetic range): 6.4 % (ref 0.0–7.0)

## 2019-11-12 LAB — MICROALBUMIN / CREATININE URINE RATIO
Creatinine,U: 116.4 mg/dL
Microalb Creat Ratio: 8.4 mg/g (ref 0.0–30.0)
Microalb, Ur: 9.8 mg/dL — ABNORMAL HIGH (ref 0.0–1.9)

## 2019-11-12 MED ORDER — VICTOZA 18 MG/3ML ~~LOC~~ SOPN: 1.8000 mg | PEN_INJECTOR | Freq: Every day | SUBCUTANEOUS | 3 refills | Status: DC

## 2019-11-12 MED ORDER — EMPAGLIFLOZIN 10 MG PO TABS: 10.0000 mg | ORAL_TABLET | Freq: Every day | ORAL | 2 refills | Status: DC

## 2019-11-12 NOTE — Assessment & Plan Note (Signed)
Continue Pravastatin 40 mg daily and lowe fat diet. Further recommendations will be given according to FLP results.

## 2019-11-12 NOTE — Assessment & Plan Note (Signed)
HgA1C is still at goal. No changes in current management. Continue working on decreasing sugar added foods. Annual eye exam and foot care recommended. F/U in 5-6 months

## 2019-11-12 NOTE — Progress Notes (Signed)
HPI:   Miguel Sanders is a 44 y.o. male, who is here today for chronic disease management.  He was last seen on 05/15/19. Since his last OV she was evaluated for candidal balanitis. Symptoms have resolved.  Today BP is mildly elevated. He is not checking BP at home. Negative for severe/frequent headache, visual changes, chest pain, dyspnea, palpitation focal weakness, or worsening edema.  Lab Results  Component Value Date   CREATININE 0.89 05/15/2019   BUN 13 05/15/2019   NA 139 05/15/2019   K 4.4 05/15/2019   CL 102 05/15/2019   CO2 28 05/15/2019   DM II: Dx'ed in 2012. He is on Jardiance 10 mg daily and Victoza 1.8 mg [place] daily. Tolerating medications well.  Tingling left foot, stable. Negative for abdominal pain, nausea,vomiting, polydipsia,polyuria, or polyphagia. BS's: Checking with dexcome continues glucose monitoring. He does not remember readings.  Lab Results  Component Value Date   HGBA1C 6.6 (H) 05/15/2019   Lab Results  Component Value Date   MICROALBUR 5.8 (H) 10/30/2018   MICROALBUR 4.4 (H) 09/02/2017   HLD:He is on Pravastatin 40 mg daily. Tolerating medication well.  Lab Results  Component Value Date   CHOL 178 05/15/2019   HDL 31.60 (L) 05/15/2019   LDLCALC 79 10/10/2012   LDLDIRECT 109.0 05/15/2019   TRIG 251.0 (H) 05/15/2019   CHOLHDL 6 05/15/2019    No significant changes in his diet or physical activity since his last visit.   Review of Systems  Constitutional: Positive for fatigue. Negative for activity change, appetite change and fever.  HENT: Negative for nosebleeds and sore throat.   Respiratory: Negative for cough and wheezing.   Cardiovascular: Negative for palpitations.  Gastrointestinal:       No changes in bowel habits.  Genitourinary: Negative for decreased urine volume, dysuria and hematuria.  Musculoskeletal: Negative for gait problem and myalgias.  Skin: Negative for rash and wound.  Neurological:  Negative for syncope, facial asymmetry and weakness.  Rest of ROS, see pertinent positives sand negatives in HPI  Current Outpatient Medications on File Prior to Visit  Medication Sig Dispense Refill  . allopurinol (ZYLOPRIM) 100 MG tablet TAKE 1 TABLET(100 MG) BY MOUTH DAILY 90 tablet 3  . ciclopirox (LOPROX) 0.77 % cream Apply topically 2 (two) times daily. 15 g 1  . Insulin Pen Needle (B-D UF III MINI PEN NEEDLES) 31G X 5 MM MISC USE DAILY AS DIRECTED 100 each 3  . MITIGARE 0.6 MG CAPS TAKE 1 TABLET BY MOUTH DAILY AS NEEDED. HOLD ATORVASTAIN WHEN TAKING THIS MEDICATION 30 capsule 3  . pravastatin (PRAVACHOL) 40 MG tablet Take 1 tablet (40 mg total) by mouth daily. 90 tablet 3  . fluticasone (FLONASE) 50 MCG/ACT nasal spray Place 2 sprays into both nostrils daily. (Patient not taking: Reported on 10/31/2019) 16 g 6   No current facility-administered medications on file prior to visit.   Past Medical History:  Diagnosis Date  . Diabetes mellitus without complication ([place])   . Gout   . Obesity, Class III, BMI 40-49.9 (morbid obesity) (HCC)    No Known Allergies  Social History   Socioeconomic History  . Marital status: Married    Spouse name: Not on file  . Number of children: 2  . Years of education: Not on file  . Highest education level: Not on file  Occupational History  . Occupation: unemployed  Tobacco Use  . Smoking status: Former Audiological scientist  date: 08/09/2009    Years since quitting: 10.2  . Smokeless tobacco: Never Used  Substance and Sexual Activity  . Alcohol use: No  . Drug use: No  . Sexual activity: Yes  Other Topics Concern  . Not on file  Social History Narrative   Marital status: married x 5 years. Separated from wife in 08/2012.     Children: 2 children from previous marriage; gets children every two weeks; sees more frequently.     Lives: alone; separated from wife in 08/2012.     Employment: Glass blower/designer.      Tobacco: quit; smoked x 13 years.       Alcohol: once every three months.      Drugs: none now; marijuana in past.      Exercise: started going to gym in 08/2012; going 4-5 days per week; elliptical and stationary bike.            Social Determinants of Health   Financial Resource Strain:   . Difficulty of Paying Living Expenses:   Food Insecurity:   . Worried About Charity fundraiser in the Last Year:   . Arboriculturist in the Last Year:   Transportation Needs:   . Film/video editor (Medical):   Marland Kitchen Lack of Transportation (Non-Medical):   Physical Activity:   . Days of Exercise per Week:   . Minutes of Exercise per Session:   Stress:   . Feeling of Stress :   Social Connections:   . Frequency of Communication with Friends and Family:   . Frequency of Social Gatherings with Friends and Family:   . Attends Religious Services:   . Active Member of Clubs or Organizations:   . Attends Archivist Meetings:   Marland Kitchen Marital Status:     Vitals:   11/12/19 0657  BP: (!) 136/94  Pulse: 84  Resp: 16  Temp: (!) 97.4 F (36.3 C)  SpO2: 96%   Wt Readings from Last 3 Encounters:  11/12/19 (!) 392 lb (177.8 kg)  10/31/19 (!) 387 lb 9.6 oz (175.8 kg)  05/15/19 (!) 388 lb 6 oz (176.2 kg)    Body mass index is 51.72 kg/m.   Physical Exam  Nursing note and vitals reviewed. Constitutional: He is oriented to person, place, and time. He appears well-developed. No distress.  HENT:  Head: Normocephalic and atraumatic.  Mouth/Throat: Oropharynx is clear and moist and mucous membranes are normal.  Eyes: Pupils are equal, round, and reactive to light. Conjunctivae are normal.  Cardiovascular: Normal rate and regular rhythm.  No murmur heard. Pulses:      Dorsalis pedis pulses are 2+ on the right side and 2+ on the left side.  Respiratory: Effort normal and breath sounds normal. No respiratory distress.  GI: Soft. He exhibits no mass. There is no hepatomegaly. There is no abdominal tenderness.  Musculoskeletal:         General: Edema (1+ pitting LE edema, bilateral) present.  Lymphadenopathy:    He has no cervical adenopathy.  Neurological: He is alert and oriented to person, place, and time. He has normal strength. No cranial nerve deficit. Gait normal.  Skin: Skin is warm. No rash noted. No erythema.  Psychiatric: He has a normal mood and affect.  Well groomed, good eye contact.   ASSESSMENT AND PLAN:  Miguel Sanders was seen today for chronic disease management.  Orders Placed This Encounter  Procedures  . Comprehensive metabolic panel  .  Fructosamine  . Microalbumin / creatinine urine ratio  . Lipid panel  . LDL cholesterol, direct  . POCT A1C   Lab Results  Component Value Date   HGBA1C 6.4 11/12/2019   Lab Results  Component Value Date   CREATININE 0.92 11/12/2019   BUN 14 11/12/2019   NA 139 11/12/2019   K 4.4 11/12/2019   CL 102 11/12/2019   CO2 28 11/12/2019   Lab Results  Component Value Date   ALT 25 11/12/2019   AST 16 11/12/2019   ALKPHOS 85 11/12/2019   BILITOT 0.4 11/12/2019   Lab Results  Component Value Date   MICROALBUR 9.8 (H) 11/12/2019   MICROALBUR 5.8 (H) 10/30/2018   Lab Results  Component Value Date   CHOL 163 11/12/2019   HDL 34.80 (L) 11/12/2019   LDLCALC 79 10/10/2012   LDLDIRECT 91.0 11/12/2019   TRIG 276.0 (H) 11/12/2019   CHOLHDL 5 11/12/2019     Morbid obesity with BMI of 50.0-59.9, adult ([place]) Gained about 4 lb since 05/2019. Encouraged consistency with healthy diet and physical activity recommended. 150 min of weekly exercise and a healthier diet will help.   Hyperlipidemia associated with type 2 diabetes mellitus (HCC) Continue Pravastatin 40 mg daily and lowe fat diet. Further recommendations will be given according to FLP results.  Type II diabetes mellitus with neurological manifestations ([place]) HgA1C is still at goal. No changes in current management. Continue working on decreasing sugar added foods. Annual  eye exam and foot care recommended. F/U in 5-6 months   Elevated blood pressure reading BP re-checked: 142/90. He is not interested in pharmacologic treatment at this time. Recommend checking BP at home and following low salt diet.   Return in about 6 months (around 05/13/2020) for cpe.   Miguel Bhargava G. [place], MD  [facility]. [place] office.

## 2019-11-12 NOTE — Assessment & Plan Note (Signed)
Gained about 4 lb since 05/2019. Encouraged consistency with healthy diet and physical activity recommended. 150 min of weekly exercise and a healthier diet will help.

## 2019-11-12 NOTE — Patient Instructions (Signed)
A few things to remember from today's visit:  No changes in medications today. Will adjust cholesterol med if needed according to results. If possible exercise 150 min weekly and decrease calorie intake.   Please be sure medication list is accurate. If a new problem present, please set up appointment sooner than planned today.

## 2019-11-15 LAB — FRUCTOSAMINE: Fructosamine: 238 umol/L (ref 205–285)

## 2019-11-21 ENCOUNTER — Telehealth (INDEPENDENT_AMBULATORY_CARE_PROVIDER_SITE_OTHER): Payer: 59 | Admitting: Family Medicine

## 2019-11-21 DIAGNOSIS — B3742 Candidal balanitis: Secondary | ICD-10-CM | POA: Diagnosis not present

## 2019-11-21 MED ORDER — FLUCONAZOLE 100 MG PO TABS: 100.0000 mg | ORAL_TABLET | Freq: Every day | ORAL | 0 refills | Status: DC

## 2019-11-21 MED ORDER — CICLOPIROX OLAMINE 0.77 % EX CREA: TOPICAL_CREAM | Freq: Two times a day (BID) | CUTANEOUS | 1 refills | Status: DC

## 2019-11-21 NOTE — Progress Notes (Signed)
This visit type was conducted due to national recommendations for restrictions regarding the COVID-19 pandemic in an effort to limit this patient's exposure and mitigate transmission in our community.   Virtual Visit via Telephone Note  I connected with Miguel Sanders on 11/21/19 at  7:45 AM EDT by telephone and verified that I am speaking with the correct person using two identifiers.   I discussed the limitations, risks, security and privacy concerns of performing an evaluation and management service by telephone and the availability of in person appointments. I also discussed with the patient that there may be a patient responsible charge related to this service. The patient expressed understanding and agreed to proceed. We attempted to connect through care Almyra Free had some technical difficulties and this was converted to a phone note  Location patient: home Location provider: work or home office Participants present for the call: patient, provider Patient did not have a visit in the prior 7 days to address this/these issue(s).   History of Present Illness:  Patient has type 2 diabetes and was recently seen for yeast balanitis type symptoms.  We placed him on 3 days of fluconazole and Loprox and he states he is about 75% better.  He still has some mild itching intermittently.  He is running out of the cream.  He does take Jardiance which is a risk factor for yeast infections.  Not had recurrent yeast issues in the past.  He had follow-up for his diabetes with his primary recently with A1c 6.4%.  He denies any burning with urination.  No other rashes.  Past Medical History:  Diagnosis Date  . Diabetes mellitus without complication ([place])   . Gout   . Obesity, Class III, BMI 40-49.9 (morbid obesity) ([place])    Past Surgical History:  Procedure Laterality Date  . BACK SURGERY    . SPINE SURGERY      reports that he quit smoking about 10 years ago. He has never used smokeless tobacco. He  reports that he does not drink alcohol or use drugs. family history includes Arthritis in his mother; Cancer in an other family member; Colon cancer (age of onset: 53) in his maternal grandfather; Hypertension in an other family member. No Known Allergies    Observations/Objective: Patient sounds cheerful and well on the phone. I do not appreciate any SOB. Speech and thought processing are grossly intact. Patient reported vitals:  Assessment and Plan:  Yeast balanitis with risk factors including type 2 diabetes, Jardiance therapy, and uncircumcised  -We recommend he continue with the Loprox twice daily with refill given -Fluconazole 100 mg once daily for 7 days and recommend follow-up with primary if not fully cleared after 2 weeks -May need to discuss change from Deputy if he continues to have yeast/fungal issues  Follow Up Instructions:  -with primary as scheduled.     [id number] [id number] [id number] [id number] [id number] 21-30 I did not refer this patient for an OV in the next 24 hours for this/these issue(s).  I discussed the assessment and treatment plan with the patient. The patient was provided an opportunity to ask questions and all were answered. The patient agreed with the plan and demonstrated an understanding of the instructions.   The patient was advised to call back or seek an in-person evaluation if the symptoms worsen or if the condition fails to improve as anticipated.  I provided 12 minutes of non-face-to-face time during this encounter.   Carolann Littler, MD

## 2019-12-12 ENCOUNTER — Encounter: Payer: Self-pay | Admitting: Family Medicine

## 2019-12-12 ENCOUNTER — Ambulatory Visit: Payer: 59 | Admitting: Family Medicine

## 2019-12-12 ENCOUNTER — Other Ambulatory Visit: Payer: Self-pay

## 2019-12-12 VITALS — BP 132/84 | HR 87 | Temp 97.1°F | Resp 16 | Ht 73.0 in | Wt 393.2 lb

## 2019-12-12 DIAGNOSIS — E1149 Type 2 diabetes mellitus with other diabetic neurological complication: Secondary | ICD-10-CM

## 2019-12-12 DIAGNOSIS — B3742 Candidal balanitis: Secondary | ICD-10-CM | POA: Diagnosis not present

## 2019-12-12 DIAGNOSIS — Z6841 Body Mass Index (BMI) 40.0 and over, adult: Secondary | ICD-10-CM | POA: Diagnosis not present

## 2019-12-12 MED ORDER — FLUCONAZOLE 150 MG PO TABS: 150.0000 mg | ORAL_TABLET | ORAL | 0 refills | Status: AC

## 2019-12-12 MED ORDER — CLOTRIMAZOLE 1 % EX CREA: 1.0000 "application " | TOPICAL_CREAM | Freq: Two times a day (BID) | CUTANEOUS | 0 refills | Status: AC

## 2019-12-12 NOTE — Patient Instructions (Addendum)
A few things to remember from today's visit:   Diflucan 150 mg every 5 to 7 days x 3. Clotrimazole cream for 7 to 14 days Stop Jardiance. Continue monitoring your blood sugars.  If you need refills please call your pharmacy. Do not use My Chart to request refills or for acute issues that need immediate attention.    Balanitis  Balanitis is swelling and irritation (inflammation) of the head of the penis (glans penis). The condition may also cause inflammation of the skin around the glans penis (foreskin) in men who have not been circumcised. It may develop because of an infection or another medical condition. Balanitis occurs most often among men who have not had their foreskin removed (uncircumcised men). Balanitis sometimes causes scarring of the penis or foreskin, which can require surgery. Untreated balanitis can increase the risk of penile cancer. What are the causes? Common causes of this condition include:  Poor personal hygiene, especially in uncircumcised men. Not cleaning the glans penis and foreskin well can result in buildup of bacteria, viruses, and yeast, which can lead to infection and inflammation.  Irritation and lack of air flow due to fluid (smegma) that can build up on the glans penis. Other causes include:  Chemical irritation from products such as soaps or shower gels (especially those that have fragrance), condoms, personal lubricants, petroleum jelly, spermicides, or fabric softeners.  Skin conditions, such as eczema, dermatitis, and psoriasis.  Allergies to medicines, such as tetracycline and sulfa drugs.  Certain medical conditions, including liver cirrhosis, congestive heart failure, diabetes, and kidney disease.  Infections, such as candidiasis, HPV (human papillomavirus), herpes simplex, gonorrhea, and syphilis.  Severe obesity. What increases the risk? The following factors may make you more likely to develop this condition:  Having diabetes. This is  the most common risk factor.  Having a tight foreskin that is difficult to pull back (retract) past the glans.  Having sexual intercourse without using a condom. What are the signs or symptoms? Symptoms of this condition include:  Discharge from under the foreskin.  A bad smell.  Pain or difficulty retracting the foreskin.  Tenderness, redness, and swelling of the glans.  A rash or sores on the glans or foreskin.  Itchiness.  Inability to get an erection due to pain.  Difficulty urinating.  Scarring of the penis or foreskin, in some cases. How is this diagnosed? This condition may be diagnosed based on:  A physical exam.  Testing a swab of discharge to check for bacterial or fungal infection.  Blood tests: ? To check for viruses that can cause balanitis. ? To check your blood sugar (glucose) level. High blood glucose could be a sign of diabetes, which can cause balanitis. How is this treated? Treatment for balanitis depends on the cause. Treatment may include:  Improving personal hygiene. Your health care provider may recommend sitting in a bath of warm water that is deep enough to cover your hips and buttocks (sitz bath).  Medicines such as: ? Creams or ointments to reduce swelling (steroids) or to treat an infection. ? Antibiotic medicine. ? Antifungal medicine.  Surgery to remove or cut the foreskin (circumcision). This may be done if you have scarring on the foreskin that makes it difficult to retract.  Controlling other medical problems that may be causing your condition or making it worse. Follow these instructions at home:  Do not have sex until the condition clears up, or until your health care provider approves.  Keep your penis clean  and dry. Take sitz baths as recommended by your health care provider.  Avoid products that irritate your skin or make symptoms worse, such as soaps and shower gels that have fragrance.  Take over-the-counter and  prescription medicines only as told by your health care provider. ? If you were prescribed an antibiotic medicine or a cream or ointment, use it as told by your health care provider. Do not stop using your medicine, cream, or ointment even if you start to feel better. ? Do not drive or use heavy machinery while taking prescription pain medicine. Contact a health care provider if:  Your symptoms get worse or do not improve with home care.  You develop chills or a fever.  You have trouble urinating.  You cannot retract your foreskin. Get help right away if:  You develop severe pain.  You are unable to urinate. Summary  Balanitis is inflammation of the head of the penis (glans penis) caused by irritation or infection.  Balanitis causes pain, redness, and swelling of the glans penis.  This condition is most common among uncircumcised men who do not keep their glans penis clean and in men who have diabetes.  Treatment may include creams or ointments.  Good hygiene is important for prevention. This includes pulling back the foreskin when washing your penis. This information is not intended to replace advice given to you by your health care provider. Make sure you discuss any questions you have with your health care provider. Document Revised: 07/08/2017 Document Reviewed: 06/14/2016 Elsevier Patient Education  [place].  Please be sure medication list is accurate. If a new problem present, please set up appointment sooner than planned today.

## 2019-12-12 NOTE — Progress Notes (Signed)
ACUTE VISIT     Chief Complaint  Patient presents with  . Recurrent yeast infection   HPI: [name] is a 44 y.o. male, who is here today complaining of recurrent erythema and superficial excoriations on prepuce, this is the 3rd episodes. Denies risk factors for STD's. Burning sensation and moderate pain when cleaning area with water and during urination. Negative for fever, chills, hematuria, increased frequency, or urgency. He has not noted urethral discharge. Minimal pruritus. He has completed treatment with Diflucan x 2: 100 mg x 3 days and 100 mg x 7 days + topical antifungal medication. He is on Jardiance 10 mg to treat DM2. Also on Victoza 1.8 mg daily.  FG 140's-160's, today 185. He loves sweets, it has been hard for him to stop intake. He eats sweets daily, trying to avoid ice cream. Last night he ate 3 slices of pizza and 3 popsicles.  Lab Results  Component Value Date   ALT 25 11/12/2019   AST 16 11/12/2019   ALKPHOS 85 11/12/2019   BILITOT 0.4 11/12/2019   Negative for abdominal pain, nausea,vomiting, polydipsia,polyuria, or polyphagia.  Lab Results  Component Value Date   HGBA1C 6.4 11/12/2019   Review of Systems  Constitutional: Negative for activity change, appetite change and fatigue.  HENT: Negative for mouth sores and sore throat.   Respiratory: Negative for cough, shortness of breath and wheezing.   Genitourinary: Positive for penile pain. Negative for difficulty urinating, scrotal swelling and testicular pain.  Musculoskeletal: Negative for joint swelling and myalgias.  Rest see pertinent positives and negatives per HPI.  Current Outpatient Medications on File Prior to Visit  Medication Sig Dispense Refill  . allopurinol (ZYLOPRIM) 100 MG tablet TAKE 1 TABLET(100 MG) BY MOUTH DAILY 90 tablet 3  . Insulin Pen Needle (B-D UF III MINI PEN NEEDLES) 31G X 5 MM MISC USE DAILY AS DIRECTED 100 each 3  . liraglutide (VICTOZA) 18  MG/3ML SOPN Inject 0.3 mLs (1.8 mg total) into the skin daily. 9 mL 3  . MITIGARE 0.6 MG CAPS TAKE 1 TABLET BY MOUTH DAILY AS NEEDED. HOLD ATORVASTAIN WHEN TAKING THIS MEDICATION 30 capsule 3  . pravastatin (PRAVACHOL) 40 MG tablet Take 1 tablet (40 mg total) by mouth daily. 90 tablet 3  . fluticasone (FLONASE) 50 MCG/ACT nasal spray Place 2 sprays into both nostrils daily. (Patient not taking: Reported on 10/31/2019) 16 g 6   No current facility-administered medications on file prior to visit.   Past Medical History:  Diagnosis Date  . Diabetes mellitus without complication ([place])   . Gout   . Obesity, Class III, BMI 40-49.9 (morbid obesity) ([facility])    No Known Allergies  Social History   Socioeconomic History  . Marital status: Married    Spouse name: Not on file  . Number of children: 2  . Years of education: Not on file  . Highest education level: Not on file  Occupational History  . Occupation: unemployed  Tobacco Use  . Smoking status: Former Smoker    Quit date: 08/09/2009    Years since quitting: 10.3  . Smokeless tobacco: Never Used  Substance and Sexual Activity  . Alcohol use: No  . Drug use: No  . Sexual activity: Yes  Other Topics Concern  . Not on file  Social History Narrative   Marital status: married x 5 years. Separated from wife in 08/2012.     Children: 2 children from previous marriage; gets children  every two weeks; sees more frequently.     Lives: alone; separated from wife in 08/2012.     Employment: Glass blower/designer.      Tobacco: quit; smoked x 13 years.      Alcohol: once every three months.      Drugs: none now; marijuana in past.      Exercise: started going to gym in 08/2012; going 4-5 days per week; elliptical and stationary bike.            Social Determinants of Health   Financial Resource Strain:   . Difficulty of Paying Living Expenses:   Food Insecurity:   . Worried About Charity fundraiser in the Last Year:   . Arboriculturist in the  Last Year:   Transportation Needs:   . Film/video editor (Medical):   Marland Kitchen Lack of Transportation (Non-Medical):   Physical Activity:   . Days of Exercise per Week:   . Minutes of Exercise per Session:   Stress:   . Feeling of Stress :   Social Connections:   . Frequency of Communication with Friends and Family:   . Frequency of Social Gatherings with Friends and Family:   . Attends Religious Services:   . Active Member of Clubs or Organizations:   . Attends Archivist Meetings:   Marland Kitchen Marital Status:     Vitals:   12/12/19 0713  BP: 132/84  Pulse: 87  Resp: 16  Temp: (!) 97.1 F (36.2 C)  SpO2: 96%   Wt Readings from Last 3 Encounters:  12/12/19 (!) 393 lb 4 oz (178.4 kg)  11/12/19 (!) 392 lb (177.8 kg)  10/31/19 (!) 387 lb 9.6 oz (175.8 kg)   Body mass index is 51.88 kg/m.  Physical Exam  Nursing note and vitals reviewed. Constitutional: He is oriented to person, place, and time. He appears well-developed. No distress.  HENT:  Head: Normocephalic and atraumatic.  Eyes: Conjunctivae are normal.  Cardiovascular: Normal rate.  Respiratory: Effort normal. No respiratory distress.  Genitourinary: Circumcised. Penile erythema present. No penile tenderness. No discharge found.    Genitourinary Comments: Mild erythema and superficial excoriations around balanopreputial area. No edema or drainage.   Musculoskeletal:        General: No edema.  Lymphadenopathy:       Right: No inguinal adenopathy present.       Left: No inguinal adenopathy present.  Neurological: He is alert and oriented to person, place, and time. He has normal strength. No cranial nerve deficit. Gait normal.  Skin: Skin is warm. Rash is not vesicular.  See GU system.  Psychiatric: He has a normal mood and affect.  Well groomed, good eye contact.   ASSESSMENT AND PLAN:  Miguel Sanders was seen today for recurrent yeast infection.  Diagnoses and all orders for this visit:  Candidal  balanitis Requiring. We discussed possible exacerbating factors, most likely related with Jardiance.  So recommend discontinuing. Keep area clean with water. Monitor for signs of bacterial infection. We discussed some side effects of Diflucan, last LFTs in normal range.  -     fluconazole (DIFLUCAN) 150 MG tablet; Take 1 tablet (150 mg total) by mouth once a week for 3 doses. -     clotrimazole (CLOTRIMAZOLE GRX) 1 % cream; Apply 1 application topically 2 (two) times daily for 14 days.  Type II diabetes mellitus with neurological manifestations ([place]) Probably has been well controlled. We discussed some side effects of Jardiance, he will  discontinue. Continue Victoza 1.8 mg daily. Decreasing sweets intake will help tremendously. For now we will not add another agent.  Morbid obesity with BMI of 50.0-59.9, adult ([place]) Weight has been stable. Recommend decreasing amount of sweets. Weight loss will help greatly with most of his chronic health problems We may even be able to decrease some of his medications.  Return if symptoms worsen or fail to improve.   Chayla Shands G. [place], MD  [facility]. [place] office.  Discharge Instructions   None

## 2020-02-27 ENCOUNTER — Other Ambulatory Visit: Payer: Self-pay | Admitting: Family Medicine

## 2020-02-27 DIAGNOSIS — E785 Hyperlipidemia, unspecified: Secondary | ICD-10-CM

## 2020-02-27 DIAGNOSIS — E1169 Type 2 diabetes mellitus with other specified complication: Secondary | ICD-10-CM

## 2020-02-27 DIAGNOSIS — Z8739 Personal history of other diseases of the musculoskeletal system and connective tissue: Secondary | ICD-10-CM

## 2020-02-29 ENCOUNTER — Ambulatory Visit: Payer: 59 | Admitting: Family Medicine

## 2020-02-29 ENCOUNTER — Encounter: Payer: Self-pay | Admitting: Family Medicine

## 2020-02-29 ENCOUNTER — Other Ambulatory Visit: Payer: Self-pay

## 2020-02-29 VITALS — BP 130/70 | HR 88 | Ht 73.0 in | Wt 391.3 lb

## 2020-02-29 DIAGNOSIS — H02846 Edema of left eye, unspecified eyelid: Secondary | ICD-10-CM | POA: Diagnosis not present

## 2020-02-29 DIAGNOSIS — M62830 Muscle spasm of back: Secondary | ICD-10-CM

## 2020-02-29 MED ORDER — CYCLOBENZAPRINE HCL 10 MG PO TABS: 10.0000 mg | ORAL_TABLET | Freq: Three times a day (TID) | ORAL | 0 refills | Status: DC | PRN

## 2020-02-29 NOTE — Progress Notes (Signed)
Established Patient Office Visit  Subjective:  Patient ID: Miguel Sanders, male    DOB: 02/27/1976  Age: 44 y.o. MRN: [id number]  CC:  Chief Complaint  Patient presents with  . Back Pain    started Sunday, spasms that last 4-5 seconds.  . eye swelling    vision is fine, eye lid swollen.    HPI [name] presents for the following acute concerns  Intermittent transient right low back pain.  Onset last Sunday.  Denies specific injury.  He did fall 3 days prior to back but is not sure these are related.  He describes intermittent "spasms "got last about 5 seconds where he feels the muscles are tightening up.  This is confined to the right lower lumbar area.  No radiculitis symptoms.  He has had remote history of back surgery.  Requesting muscle relaxer.  He has used these in the past and tolerated well  Second acute issues left upper eyelid swelling.  Onset this past Tuesday.  He notes a little soreness in the outer aspect.  No matting.  No visual changes.  No skin rash.  No known foreign bodies.  Past Medical History:  Diagnosis Date  . Diabetes mellitus without complication ([place])   . Gout   . Obesity, Class III, BMI 40-49.9 (morbid obesity) ([place])     Past Surgical History:  Procedure Laterality Date  . BACK SURGERY    . SPINE SURGERY      Family History  Problem Relation Age of Onset  . Arthritis Mother   . Colon cancer Maternal Grandfather 21  . Cancer Other   . Hypertension Other     Social History   Socioeconomic History  . Marital status: Married    Spouse name: Not on file  . Number of children: 2  . Years of education: Not on file  . Highest education level: Not on file  Occupational History  . Occupation: unemployed  Tobacco Use  . Smoking status: Former Smoker    Quit date: 08/09/2009    Years since quitting: 10.5  . Smokeless tobacco: Never Used  Substance and Sexual Activity  . Alcohol use: No  . Drug use: No  . Sexual activity: Yes    Other Topics Concern  . Not on file  Social History Narrative   Marital status: married x 5 years. Separated from wife in 08/2012.     Children: 2 children from previous marriage; gets children every two weeks; sees more frequently.     Lives: alone; separated from wife in 08/2012.     Employment: Glass blower/designer.      Tobacco: quit; smoked x 13 years.      Alcohol: once every three months.      Drugs: none now; marijuana in past.      Exercise: started going to gym in 08/2012; going 4-5 days per week; elliptical and stationary bike.            Social Determinants of Health   Financial Resource Strain:   . Difficulty of Paying Living Expenses:   Food Insecurity:   . Worried About Charity fundraiser in the Last Year:   . Arboriculturist in the Last Year:   Transportation Needs:   . Film/video editor (Medical):   Marland Kitchen Lack of Transportation (Non-Medical):   Physical Activity:   . Days of Exercise per Week:   . Minutes of Exercise per Session:   Stress:   .  Feeling of Stress :   Social Connections:   . Frequency of Communication with Friends and Family:   . Frequency of Social Gatherings with Friends and Family:   . Attends Religious Services:   . Active Member of Clubs or Organizations:   . Attends Archivist Meetings:   Marland Kitchen Marital Status:   Intimate Partner Violence:   . Fear of Current or Ex-Partner:   . Emotionally Abused:   Marland Kitchen Physically Abused:   . Sexually Abused:     Outpatient Medications Prior to Visit  Medication Sig Dispense Refill  . allopurinol (ZYLOPRIM) 100 MG tablet TAKE 1 TABLET(100 MG) BY MOUTH DAILY 90 tablet 3  . Insulin Pen Needle (B-D UF III MINI PEN NEEDLES) 31G X 5 MM MISC USE DAILY AS DIRECTED 100 each 3  . liraglutide (VICTOZA) 18 MG/3ML SOPN Inject 0.3 mLs (1.8 mg total) into the skin daily. 9 mL 3  . MITIGARE 0.6 MG CAPS TAKE 1 TABLET BY MOUTH DAILY AS NEEDED. HOLD ATORVASTAIN WHEN TAKING THIS MEDICATION 30 capsule 3  .  pravastatin (PRAVACHOL) 40 MG tablet TAKE 1 TABLET BY MOUTH EVERY DAY 90 tablet 3  . fluticasone (FLONASE) 50 MCG/ACT nasal spray Place 2 sprays into both nostrils daily. (Patient not taking: Reported on 10/31/2019) 16 g 6   No facility-administered medications prior to visit.    No Known Allergies  ROS Review of Systems  Constitutional: Negative for chills and fever.  Eyes: Negative for visual disturbance.  Respiratory: Negative for shortness of breath.   Genitourinary: Negative for dysuria and flank pain.  Musculoskeletal: Positive for back pain.  Skin: Negative for rash.  Neurological: Negative for headaches.      Objective:    Physical Exam Vitals reviewed.  Eyes:     Comments: has some mild diffuse swelling of the left upper eyelid.  Conjunctive appears normal.  No evidence for stye or chalazion.  Diffuse swelling of the lid.  Nontender.  Cornea appears normal.  Extraocular movements are normal  Cardiovascular:     Rate and Rhythm: Normal rate and regular rhythm.  Pulmonary:     Effort: Pulmonary effort is normal.     Breath sounds: Normal breath sounds.  Musculoskeletal:     Comments: Spine and lower back is nontender to palpation  Neurological:     Mental Status: He is alert.     Comments: Full strength throughout     BP (!) 130/70   Pulse 88   Ht 6\' 1"  (1.854 m)   Wt (!) 391 lb 5 oz (177.5 kg)   SpO2 96%   BMI 51.63 kg/m  Wt Readings from Last 3 Encounters:  02/29/20 (!) 391 lb 5 oz (177.5 kg)  12/12/19 (!) 393 lb 4 oz (178.4 kg)  11/12/19 (!) 392 lb (177.8 kg)     Health Maintenance Due  Topic Date Due  . Hepatitis C Screening  Never done  . OPHTHALMOLOGY EXAM  Never done  . COVID-19 Vaccine (1) Never done    There are no preventive care reminders to display for this patient.  Lab Results  Component Value Date   TSH 1.05 06/01/2017   Lab Results  Component Value Date   WBC 6.1 06/01/2017   HGB 15.3 06/01/2017   HCT 45.3 06/01/2017   MCV  90.4 06/01/2017   PLT 186.0 06/01/2017   Lab Results  Component Value Date   NA 139 11/12/2019   K 4.4 11/12/2019   CO2 28 11/12/2019  GLUCOSE 143 (H) 11/12/2019   BUN 14 11/12/2019   CREATININE 0.92 11/12/2019   BILITOT 0.4 11/12/2019   ALKPHOS 85 11/12/2019   AST 16 11/12/2019   ALT 25 11/12/2019   PROT 6.8 11/12/2019   ALBUMIN 4.0 11/12/2019   CALCIUM 9.3 11/12/2019   GFR 89.95 11/12/2019   Lab Results  Component Value Date   CHOL 163 11/12/2019   Lab Results  Component Value Date   HDL 34.80 (L) 11/12/2019   Lab Results  Component Value Date   LDLCALC 79 10/10/2012   Lab Results  Component Value Date   TRIG 276.0 (H) 11/12/2019   Lab Results  Component Value Date   CHOLHDL 5 11/12/2019   Lab Results  Component Value Date   HGBA1C 6.4 11/12/2019      Assessment & Plan:   #1 low back pain with what sounds like intermittent muscle spasms  -Recommend topical heat along with muscle massage and consider topical sports creams.  We wrote for cyclobenzaprine 10 mg nightly.  He is aware this is potentially sedating  #2 swelling left upper eyelid.  No evidence for stye or chalazion.  Suspect obstructed.  No evidence for cellulitis  -Recommend warm compresses several times daily and touch base in 1 week if not resolving  Meds ordered this encounter  Medications  . cyclobenzaprine (FLEXERIL) 10 MG tablet    Sig: Take 1 tablet (10 mg total) by mouth 3 (three) times daily as needed for muscle spasms.    Dispense:  30 tablet    Refill:  0    Follow-up: No follow-ups on file.    Carolann Littler, MD

## 2020-02-29 NOTE — Patient Instructions (Signed)
Use warm compresses to left eye several times daily   Let me know by end of next week if not improving.

## 2020-04-28 ENCOUNTER — Other Ambulatory Visit: Payer: Self-pay

## 2020-04-28 ENCOUNTER — Ambulatory Visit: Payer: 59 | Admitting: Family Medicine

## 2020-04-28 ENCOUNTER — Encounter: Payer: Self-pay | Admitting: Family Medicine

## 2020-04-28 VITALS — BP 140/100 | HR 87 | Temp 99.0°F | Ht 73.0 in | Wt 388.0 lb

## 2020-04-28 DIAGNOSIS — E1149 Type 2 diabetes mellitus with other diabetic neurological complication: Secondary | ICD-10-CM

## 2020-04-28 DIAGNOSIS — R112 Nausea with vomiting, unspecified: Secondary | ICD-10-CM | POA: Diagnosis not present

## 2020-04-28 NOTE — Progress Notes (Signed)
Established Patient Office Visit  Subjective:  Patient ID: Miguel Sanders, male    DOB: Feb 05, 1976  Age: 44 y.o. MRN: [id number]  CC:  Chief Complaint  Patient presents with  . Nausea  . Emesis    HPI Miguel Sanders presents for 2 recent episodes of nausea and vomiting.  Thursday was the first episode.  He had eaten relatively light meal then and vomiting occurred hours later.  He had second episode Saturday which occurred about 2 hours after Victoza dose.  He was concerned this may be related to Victoza but he has been on this a good while and has not seen consistent pattern.  He took the Victoza this morning and had no issues No hematemesis.  No history of gallstone pain.  Denies right upper quadrant pain.  No history of pancreatitis.  Denies any current nausea or vomiting.  No recent stool changes.  No fever.  Chest pain.  No localizing abdominal pain.   Does have hx of GERD but no recent GERD symptoms.  Past Medical History:  Diagnosis Date  . Diabetes mellitus without complication ([place])   . Gout   . Obesity, Class III, BMI 40-49.9 (morbid obesity) ([place])     Past Surgical History:  Procedure Laterality Date  . BACK SURGERY    . SPINE SURGERY      Family History  Problem Relation Age of Onset  . Arthritis Mother   . Colon cancer Maternal Grandfather 59  . Cancer Other   . Hypertension Other     Social History   Socioeconomic History  . Marital status: Married    Spouse name: Not on file  . Number of children: 2  . Years of education: Not on file  . Highest education level: Not on file  Occupational History  . Occupation: unemployed  Tobacco Use  . Smoking status: Former Smoker    Quit date: 08/09/2009    Years since quitting: 10.7  . Smokeless tobacco: Never Used  Substance and Sexual Activity  . Alcohol use: No  . Drug use: No  . Sexual activity: Yes  Other Topics Concern  . Not on file  Social History Narrative   Marital status: married x  5 years. Separated from wife in 08/2012.     Children: 2 children from previous marriage; gets children every two weeks; sees more frequently.     Lives: alone; separated from wife in 08/2012.     Employment: Glass blower/designer.      Tobacco: quit; smoked x 13 years.      Alcohol: once every three months.      Drugs: none now; marijuana in past.      Exercise: started going to gym in 08/2012; going 4-5 days per week; elliptical and stationary bike.            Social Determinants of Health   Financial Resource Strain:   . Difficulty of Paying Living Expenses: Not on file  Food Insecurity:   . Worried About Charity fundraiser in the Last Year: Not on file  . Ran Out of Food in the Last Year: Not on file  Transportation Needs:   . Lack of Transportation (Medical): Not on file  . Lack of Transportation (Non-Medical): Not on file  Physical Activity:   . Days of Exercise per Week: Not on file  . Minutes of Exercise per Session: Not on file  Stress:   . Feeling of Stress : Not on file  Social Connections:   . Frequency of Communication with Friends and Family: Not on file  . Frequency of Social Gatherings with Friends and Family: Not on file  . Attends Religious Services: Not on file  . Active Member of Clubs or Organizations: Not on file  . Attends Archivist Meetings: Not on file  . Marital Status: Not on file  Intimate Partner Violence:   . Fear of Current or Ex-Partner: Not on file  . Emotionally Abused: Not on file  . Physically Abused: Not on file  . Sexually Abused: Not on file    Outpatient Medications Prior to Visit  Medication Sig Dispense Refill  . allopurinol (ZYLOPRIM) 100 MG tablet TAKE 1 TABLET(100 MG) BY MOUTH DAILY 90 tablet 3  . cyclobenzaprine (FLEXERIL) 10 MG tablet Take 1 tablet (10 mg total) by mouth 3 (three) times daily as needed for muscle spasms. 30 tablet 0  . Insulin Pen Needle (B-D UF III MINI PEN NEEDLES) 31G X 5 MM MISC USE DAILY AS DIRECTED  100 each 3  . liraglutide (VICTOZA) 18 MG/3ML SOPN Inject 0.3 mLs (1.8 mg total) into the skin daily. 9 mL 3  . MITIGARE 0.6 MG CAPS TAKE 1 TABLET BY MOUTH DAILY AS NEEDED. HOLD ATORVASTAIN WHEN TAKING THIS MEDICATION 30 capsule 3  . pravastatin (PRAVACHOL) 40 MG tablet TAKE 1 TABLET BY MOUTH EVERY DAY 90 tablet 3   No facility-administered medications prior to visit.    No Known Allergies  ROS Review of Systems  Constitutional: Negative for chills and fever.  HENT: Negative for trouble swallowing.   Respiratory: Negative for shortness of breath.   Cardiovascular: Negative for chest pain.  Gastrointestinal: Positive for nausea and vomiting. Negative for abdominal pain, blood in stool and diarrhea.  Genitourinary: Negative for dysuria.      Objective:    Physical Exam Vitals reviewed.  Cardiovascular:     Rate and Rhythm: Normal rate and regular rhythm.  Pulmonary:     Effort: Pulmonary effort is normal.     Breath sounds: Normal breath sounds.  Abdominal:     Palpations: Abdomen is soft.     Tenderness: There is no abdominal tenderness. There is no guarding or rebound.  Neurological:     Mental Status: He is alert.     BP (!) 140/100   Pulse 87   Temp 99 F (37.2 C) (Oral)   Ht 6\' 1"  (1.854 m)   Wt (!) 388 lb (176 kg)   SpO2 97%   BMI 51.19 kg/m  Wt Readings from Last 3 Encounters:  04/28/20 (!) 388 lb (176 kg)  02/29/20 (!) 391 lb 5 oz (177.5 kg)  12/12/19 (!) 393 lb 4 oz (178.4 kg)     Health Maintenance Due  Topic Date Due  . Hepatitis C Screening  Never done  . OPHTHALMOLOGY EXAM  Never done  . COVID-19 Vaccine (1) Never done  . INFLUENZA VACCINE  Never done    There are no preventive care reminders to display for this patient.  Lab Results  Component Value Date   TSH 1.05 06/01/2017   Lab Results  Component Value Date   WBC 6.1 06/01/2017   HGB 15.3 06/01/2017   HCT 45.3 06/01/2017   MCV 90.4 06/01/2017   PLT 186.0 06/01/2017   Lab  Results  Component Value Date   NA 139 11/12/2019   K 4.4 11/12/2019   CO2 28 11/12/2019   GLUCOSE 143 (H) 11/12/2019   BUN  14 11/12/2019   CREATININE 0.92 11/12/2019   BILITOT 0.4 11/12/2019   ALKPHOS 85 11/12/2019   AST 16 11/12/2019   ALT 25 11/12/2019   PROT 6.8 11/12/2019   ALBUMIN 4.0 11/12/2019   CALCIUM 9.3 11/12/2019   GFR 89.95 11/12/2019   Lab Results  Component Value Date   CHOL 163 11/12/2019   Lab Results  Component Value Date   HDL 34.80 (L) 11/12/2019   Lab Results  Component Value Date   LDLCALC 79 10/10/2012   Lab Results  Component Value Date   TRIG 276.0 (H) 11/12/2019   Lab Results  Component Value Date   CHOLHDL 5 11/12/2019   Lab Results  Component Value Date   HGBA1C 6.4 11/12/2019      Assessment & Plan:   Problem List Items Addressed This Visit    None    Visit Diagnoses    Non-intractable vomiting with nausea, unspecified vomiting type    -  Primary   Relevant Orders   Lipase   CBC (no diff)   CMP    Recent episodes of nausea and vomiting-doubt related to Victoza as symptoms have been inconsistent and has been on this medication for some time.  He does not have symptoms to suggest gallbladder disease such as right upper quadrant pain.  No history of pancreatitis.  No hx of known gastroparesis and no consistent pattern of post-prandial nausea/vomiting.   -Check lipase, CMP, CBC -Follow-up promptly for any recurrent episodes  No orders of the defined types were placed in this encounter.   Follow-up: No follow-ups on file.    Carolann Littler, MD

## 2020-04-29 LAB — LIPASE: Lipase: 30 U/L (ref 7–60)

## 2020-04-29 LAB — COMPREHENSIVE METABOLIC PANEL
AG Ratio: 1.4 (calc) (ref 1.0–2.5)
ALT: 50 U/L — ABNORMAL HIGH (ref 9–46)
AST: 39 U/L (ref 10–40)
Albumin: 4.3 g/dL (ref 3.6–5.1)
Alkaline phosphatase (APISO): 95 U/L (ref 36–130)
BUN: 10 mg/dL (ref 7–25)
CO2: 28 mmol/L (ref 20–32)
Calcium: 9.8 mg/dL (ref 8.6–10.3)
Chloride: 100 mmol/L (ref 98–110)
Creat: 0.8 mg/dL (ref 0.60–1.35)
Globulin: 3 g/dL (calc) (ref 1.9–3.7)
Glucose, Bld: 172 mg/dL — ABNORMAL HIGH (ref 65–99)
Potassium: 4 mmol/L (ref 3.5–5.3)
Sodium: 137 mmol/L (ref 135–146)
Total Bilirubin: 0.4 mg/dL (ref 0.2–1.2)
Total Protein: 7.3 g/dL (ref 6.1–8.1)

## 2020-04-29 LAB — CBC
HCT: 45.9 % (ref 38.5–50.0)
Hemoglobin: 15.6 g/dL (ref 13.2–17.1)
MCH: 30.8 pg (ref 27.0–33.0)
MCHC: 34 g/dL (ref 32.0–36.0)
MCV: 90.5 fL (ref 80.0–100.0)
MPV: 11.2 fL (ref 7.5–12.5)
Platelets: 201 10*3/uL (ref 140–400)
RBC: 5.07 10*6/uL (ref 4.20–5.80)
RDW: 12.4 % (ref 11.0–15.0)
WBC: 6 10*3/uL (ref 3.8–10.8)

## 2020-04-30 ENCOUNTER — Encounter: Payer: Self-pay | Admitting: Family Medicine

## 2020-05-14 ENCOUNTER — Encounter: Payer: Self-pay | Admitting: Family Medicine

## 2020-05-14 ENCOUNTER — Ambulatory Visit (INDEPENDENT_AMBULATORY_CARE_PROVIDER_SITE_OTHER): Payer: 59 | Admitting: Family Medicine

## 2020-05-14 ENCOUNTER — Other Ambulatory Visit: Payer: Self-pay

## 2020-05-14 VITALS — BP 128/70 | HR 85 | Resp 16 | Ht 73.0 in | Wt 392.0 lb

## 2020-05-14 DIAGNOSIS — Z1159 Encounter for screening for other viral diseases: Secondary | ICD-10-CM | POA: Diagnosis not present

## 2020-05-14 DIAGNOSIS — E1169 Type 2 diabetes mellitus with other specified complication: Secondary | ICD-10-CM

## 2020-05-14 DIAGNOSIS — R5382 Chronic fatigue, unspecified: Secondary | ICD-10-CM

## 2020-05-14 DIAGNOSIS — E1149 Type 2 diabetes mellitus with other diabetic neurological complication: Secondary | ICD-10-CM

## 2020-05-14 DIAGNOSIS — Z0000 Encounter for general adult medical examination without abnormal findings: Secondary | ICD-10-CM

## 2020-05-14 DIAGNOSIS — Z6841 Body Mass Index (BMI) 40.0 and over, adult: Secondary | ICD-10-CM

## 2020-05-14 DIAGNOSIS — E785 Hyperlipidemia, unspecified: Secondary | ICD-10-CM

## 2020-05-14 LAB — POCT GLYCOSYLATED HEMOGLOBIN (HGB A1C): HbA1c, POC (controlled diabetic range): 7.9 % — AB (ref 0.0–7.0)

## 2020-05-14 LAB — GLUCOSE, POCT (MANUAL RESULT ENTRY): POC Glucose: 219 mg/dl — AB (ref 70–99)

## 2020-05-14 MED ORDER — OZEMPIC (0.25 OR 0.5 MG/DOSE) 2 MG/1.5ML ~~LOC~~ SOPN: 0.5000 mg | PEN_INJECTOR | SUBCUTANEOUS | 2 refills | Status: DC

## 2020-05-14 NOTE — Assessment & Plan Note (Signed)
High risk for OSA + on examination short neck and narrow oropharyngeal space. Appt with sleep specialist will be arranged. Wt loss will help.

## 2020-05-14 NOTE — Progress Notes (Signed)
HPI:  [name] is a 44 y.o.male here today for his routine physical examination and f/u. Last CPE: He has not had ome in years. He lives with his wife.  Regular exercise 3 or more times per week: Not consistently but he has an active job, sometimes 10,000-15,000 steps per day. Following a healthy diet: He has not been consistent.  Chronic medical problems: DM 2, gout, hyperlipidemia,ED,and fatigue among some.  Immunization History  Administered Date(s) Administered   Tdap 05/01/2018   Hep C screening: Never Last colon cancer screening: N/A Last prostate ca screening: N/A  Negative for high alcohol intake or tobacco use.  -Concerns and/or follow up today:   DM2: Currently he is not on pharmacologic treatment. Victoza was discontinued because nausea and Jardiance caused recurrent balanitis. BS's > 200. Negative for polydipsia,polyuria, or polyphagia.  Lab Results  Component Value Date   HGBA1C 6.4 11/12/2019   Lab Results  Component Value Date   MICROALBUR 9.8 (H) 11/12/2019   MICROALBUR 5.8 (H) 10/30/2018   Hyperlipidemia: Currently he is on pravastatin 40 mg daily. He has tolerated medication well.  Lab Results  Component Value Date   CHOL 163 11/12/2019   HDL 34.80 (L) 11/12/2019   LDLCALC 79 10/10/2012   LDLDIRECT 91.0 11/12/2019   TRIG 276.0 (H) 11/12/2019   CHOLHDL 5 11/12/2019   He has had fatigue for many years, he wakes up a few times throughout the night. Not sure about witnessed apnea or loud or snoring.  Review of Systems  Constitutional: Positive for fatigue. Negative for activity change, appetite change and fever.  HENT: Negative for nosebleeds, sore throat and trouble swallowing.   Eyes: Negative for redness and visual disturbance.  Respiratory: Negative for cough, shortness of breath and wheezing.   Cardiovascular: Negative for chest pain, palpitations and leg swelling.  Gastrointestinal: Negative for abdominal pain, nausea  and vomiting.  Endocrine: Negative for cold intolerance and heat intolerance.  Genitourinary: Negative for decreased urine volume, dysuria and hematuria.  Musculoskeletal: Negative for gait problem and myalgias.  Skin: Negative for rash and wound.  Neurological: Negative for syncope, weakness and headaches.  Psychiatric/Behavioral: Positive for sleep disturbance. Negative for confusion.  All other systems reviewed and are negative.  Current Outpatient Medications on File Prior to Visit  Medication Sig Dispense Refill   allopurinol (ZYLOPRIM) 100 MG tablet TAKE 1 TABLET(100 MG) BY MOUTH DAILY 90 tablet 3   cyclobenzaprine (FLEXERIL) 10 MG tablet Take 1 tablet (10 mg total) by mouth 3 (three) times daily as needed for muscle spasms. 30 tablet 0   Insulin Pen Needle (B-D UF III MINI PEN NEEDLES) 31G X 5 MM MISC USE DAILY AS DIRECTED 100 each 3   MITIGARE 0.6 MG CAPS TAKE 1 TABLET BY MOUTH DAILY AS NEEDED. HOLD ATORVASTAIN WHEN TAKING THIS MEDICATION 30 capsule 3   pravastatin (PRAVACHOL) 40 MG tablet TAKE 1 TABLET BY MOUTH EVERY DAY 90 tablet 3   No current facility-administered medications on file prior to visit.   Past Medical History:  Diagnosis Date   Diabetes mellitus without complication (H[facility])    Gout    Obesity, Class III, BMI 40-49.9 (morbid obesity) ([place])     Past Surgical History:  Procedure Laterality Date   BACK SURGERY     SPINE SURGERY      No Known Allergies  Family History  Problem Relation Age of Onset   Arthritis Mother    Colon cancer Maternal Grandfather 86  Cancer Other    Hypertension Other     Social History   Socioeconomic History   Marital status: Married    Spouse name: Not on file   Number of children: 2   Years of education: Not on file   Highest education level: Not on file  Occupational History   Occupation: unemployed  Tobacco Use   Smoking status: Former Smoker    Quit date: 08/09/2009    Years since quitting: 10.7    Smokeless tobacco: Never Used  Substance and Sexual Activity   Alcohol use: No   Drug use: No   Sexual activity: Yes  Other Topics Concern   Not on file  Social History Narrative   Marital status: married x 5 years. Separated from wife in 08/2012.     Children: 2 children from previous marriage; gets children every two weeks; sees more frequently.     Lives: alone; separated from wife in 08/2012.     Employment: Glass blower/designer.      Tobacco: quit; smoked x 13 years.      Alcohol: once every three months.      Drugs: none now; marijuana in past.      Exercise: started going to gym in 08/2012; going 4-5 days per week; elliptical and stationary bike.            Social Determinants of Health   Financial Resource Strain:    Difficulty of Paying Living Expenses: Not on file  Food Insecurity:    Worried About Charity fundraiser in the Last Year: Not on file   [employer] of Food in the Last Year: Not on file  Transportation Needs:    Lack of Transportation (Medical): Not on file   Lack of Transportation (Non-Medical): Not on file  Physical Activity:    Days of Exercise per Week: Not on file   Minutes of Exercise per Session: Not on file  Stress:    Feeling of Stress : Not on file  Social Connections:    Frequency of Communication with Friends and Family: Not on file   Frequency of Social Gatherings with Friends and Family: Not on file   Attends Religious Services: Not on file   Active Member of Clubs or Organizations: Not on file   Attends Archivist Meetings: Not on file   Marital Status: Not on file   Vitals:   05/14/20 0705  BP: 128/70  Pulse: 85  Resp: 16  SpO2: 97%   Body mass index is 51.72 kg/m.  Wt Readings from Last 3 Encounters:  05/14/20 (!) 392 lb (177.8 kg)  04/28/20 (!) 388 lb (176 kg)  02/29/20 (!) 391 lb 5 oz (177.5 kg)   Physical Exam Vitals and nursing note reviewed.  Constitutional:      General: He is not in acute  distress.    Appearance: He is well-developed and well-groomed.  HENT:     Head: Normocephalic and atraumatic.     Right Ear: Tympanic membrane, ear canal and external ear normal.     Left Ear: Tympanic membrane, ear canal and external ear normal.     Mouth/Throat:     Mouth: Mucous membranes are moist.     Pharynx: Oropharynx is clear. Uvula midline.  Eyes:     Extraocular Movements: Extraocular movements intact.     Conjunctiva/sclera: Conjunctivae normal.     Pupils: Pupils are equal, round, and reactive to light.  Neck:     Thyroid: No  thyromegaly.     Trachea: No tracheal deviation.  Cardiovascular:     Rate and Rhythm: Normal rate and regular rhythm.     Pulses:          Dorsalis pedis pulses are 2+ on the right side and 2+ on the left side.     Heart sounds: No murmur heard.   Pulmonary:     Effort: Pulmonary effort is normal. No respiratory distress.     Breath sounds: Normal breath sounds.  Abdominal:     Palpations: Abdomen is soft. There is no hepatomegaly or mass.     Tenderness: There is no abdominal tenderness.  Genitourinary:    Comments: No concerns. Musculoskeletal:        General: No tenderness.     Cervical back: Normal range of motion.     Right lower leg: 1+ Pitting Edema present.     Left lower leg: 1+ Pitting Edema present.     Comments: No major deformities appreciated and no signs of synovitis.  Lymphadenopathy:     Cervical: No cervical adenopathy.     Upper Body:     Right upper body: No supraclavicular adenopathy.     Left upper body: No supraclavicular adenopathy.  Skin:    General: Skin is warm.     Findings: No erythema.  Neurological:     Mental Status: He is alert and oriented to person, place, and time.     Cranial Nerves: No cranial nerve deficit.     Sensory: No sensory deficit.     Coordination: Coordination normal.     Gait: Gait normal.     Deep Tendon Reflexes:     Reflex Scores:      Bicep reflexes are 2+ on the right side  and 2+ on the left side.      Patellar reflexes are 2+ on the right side and 2+ on the left side. Psychiatric:        Mood and Affect: Mood and affect normal.   ASSESSMENT AND PLAN:  Mr.Kaleb was seen today for follow-up and annual exam.  Diagnoses and all orders for this visit: Orders Placed This Encounter  Procedures   BASIC METABOLIC PANEL WITH GFR   Hepatitis C antibody   Hepatic function panel   Lipid panel   Microalbumin / creatinine urine ratio   Ambulatory referral to Pulmonology   POC HgB A1c   POC Glucose (CBG)   Lab Results  Component Value Date   HGBA1C 7.9 (A) 05/14/2020   Lab Results  Component Value Date   MICROALBUR 29.5 05/14/2020   MICROALBUR 9.8 (H) 11/12/2019   Lab Results  Component Value Date   CREATININE 0.93 05/14/2020   BUN 13 05/14/2020   NA 137 05/14/2020   K 4.2 05/14/2020   CL 101 05/14/2020   CO2 24 05/14/2020   Lab Results  Component Value Date   CHOL 169 05/14/2020   HDL 27 (L) 05/14/2020   [place]  05/14/2020     Comment:     . LDL cholesterol not calculated. Triglyceride levels greater than 400 mg/dL invalidate calculated LDL results. . Reference range: <100 . Desirable range <100 mg/dL for primary prevention;   <70 mg/dL for patients with CHD or diabetic patients  with > or = 2 CHD risk factors. Marland Kitchen LDL-C is now calculated using the Martin-Hopkins  calculation, which is a validated novel method providing  better accuracy than the Friedewald equation in the  estimation of LDL-C.  Cresenciano Genre et al. Annamaria Helling. 6734;193(79): 2061-2068  (http://education.[facility].com/faq/FAQ164)    LDLDIRECT 91.0 11/12/2019   TRIG 598 (H) 05/14/2020   CHOLHDL 6.3 (H) 05/14/2020   Lab Results  Component Value Date   ALT 38 05/14/2020   AST 23 05/14/2020   ALKPHOS 85 11/12/2019   BILITOT 0.3 05/14/2020    Routine general medical examination at a health care facility We discussed the importance of regular physical activity  and healthy diet for prevention of chronic illness and/or complications. Preventive guidelines reviewed. Vaccination: Declined flu,pneumovax vaccination. Next CPE in a year.  Encounter for HCV screening test for low risk patient -     Hepatitis C antibody  Type II diabetes mellitus with neurological manifestations ([place]) Problem is not well controlled. He has not tolerated some treatments. He agrees with trying Ozempic, we discussed some side effects. Ozempic 0.25 mg weekly, he will titrate up to 1 mg as tolerated. If A1c is not at goal or he does not tolerating medication well, next option will be Januvia and/or glipizide.  Regular exercise and healthy diet with avoidance of added sugar food intake is an important part of treatment and recommended. Annual eye exam (handout with eye care providers in the area given), periodic dental and foot care recommended. F/U in 4 months   Morbid obesity with BMI of 50.0-59.9, adult ([place]) He gained some weight after stopping Victoza. We discussed the importance of consistency with a healthful diet, small changes at the time. ?  OSA.  Chronic fatigue, unspecified High risk for OSA + on examination short neck and narrow oropharyngeal space. Appt with sleep specialist will be arranged. Wt loss will help.  Hyperlipidemia associated with type 2 diabetes mellitus (HCC) No changes in current management, will follow labs done today and will give further recommendations accordingly.   Return in 4 months (on 09/14/2020) for DM II,wt.  Charly Hunton G. [place], MD  [facility]. [place] office.   A few things to remember from today's visit:   Type II diabetes mellitus with neurological manifestations ([place]) - Plan: POC HgB A1c, POC Glucose (CBG), BASIC METABOLIC PANEL WITH GFR, Microalbumin / creatinine urine ratio, Semaglutide,0.25 or 0.5MG /DOS, (OZEMPIC, 0.25 OR 0.5 MG/DOSE,) 2 MG/1.5ML SOPN  Hyperlipidemia associated with type 2 diabetes  mellitus ([place]) - Plan: Hepatic function panel, Lipid panel  Encounter for HCV screening test for low risk patient - Plan: Hepatitis C antibody  Routine general medical examination at a health care facility  Fatigue, unspecified type - Plan: Ambulatory referral to Pulmonology  If you need refills please call your pharmacy. Do not use My Chart to request refills or for acute issues that need immediate attention.  Today we added Ozempic. Start with 0.25 mg weekly for 2-3 weeks then 0.5 mg for 3-4 weeks and 1 mg. HgA1C goal < 7.0. Avoid sugar added food:regular soft drinks, energy drinks, and sports drinks. candy. cakes. cookies. pies and cobblers. sweet rolls, pastries, and donuts. fruit drinks, such as fruitades and fruit punch. dairy desserts, such as ice cream  Mediterranean diet has showed benefits for sugar control.  How much and what type of carbohydrate foods are important for managing diabetes. The balance between how much insulin is in your body and the carbohydrate you eat makes a difference in your blood glucose levels.  Fasting blood sugar ideally 130 or less, 2 hours after meals less than 180.  Regular exercise also will help with controlling disease, daily brisk walking as tolerated for 15-30 min definitively will  help.   Avoid skipping meals, blood sugar might drop and cause serious problems. Remember checking feet periodically, good dental hygiene, and annual eye exam.  Please be sure medication list is accurate. If a new problem present, please set up appointment sooner than planned today.  At least 150 minutes of moderate exercise per week, daily brisk walking for 15-30 min is a good exercise option. Healthy diet low in saturated (animal) fats and sweets and consisting of fresh fruits and vegetables, lean meats such as fish and white chicken and whole grains.  - Vaccines:  Tdap vaccine every 10 years.  Shingles vaccine recommended at age 53, could be given after 43  years of age but not sure about insurance coverage.  Pneumonia vaccines: Pneumovax at 23   -Screening recommendations for low/normal risk males:  Screening for diabetes at age 40 and every 3 years. Earlier screening if cardiovascular risk factors.N/A   Lipid screening at 35 and every 3 years. Screening starts in younger males with cardiovascular risk factors.N/A  Colon cancer screening is now at age 85 but your insurance may not cover until age 37 .screening is recommended age 49.  Prostate cancer screening: some controversy, starts usually at 29: Rectal exam and PSA.  Aortic Abdominal Aneurism once between 83 and 65 years old if ever smoker.  Also recommended:  1. Dental visit- Brush and floss your teeth twice daily; visit your dentist twice a year. 2. Eye doctor- Get an eye exam at least every 2 years. 3. Helmet use- Always wear a helmet when riding a bicycle, motorcycle, rollerblading or skateboarding. 4. Safe sex- If you may be exposed to sexually transmitted infections, use a condom. 5. Seat belts- Seat belts can save your live; always wear one. 6. Smoke/Carbon Monoxide detectors- These detectors need to be installed on the appropriate level of your home. Replace batteries at least once a year. 7. Skin cancer- When out in the sun please cover up and use sunscreen 15 SPF or higher. 8. Violence- If anyone is threatening or hurting you, please tell your healthcare provider.  9. Drink alcohol in moderation- Limit alcohol intake to one drink or less per day. Never drink and drive.

## 2020-05-14 NOTE — Assessment & Plan Note (Signed)
Problem is not well controlled. He has not tolerated some treatments. He agrees with trying Ozempic, we discussed some side effects. Ozempic 0.25 mg weekly, he will titrate up to 1 mg as tolerated. If A1c is not at goal or he does not tolerating medication well, next option will be Januvia and/or glipizide.  Regular exercise and healthy diet with avoidance of added sugar food intake is an important part of treatment and recommended. Annual eye exam (handout with eye care providers in the area given), periodic dental and foot care recommended. F/U in 4 months

## 2020-05-14 NOTE — Patient Instructions (Addendum)
A few things to remember from today's visit:   Type II diabetes mellitus with neurological manifestations ([place]) - Plan: POC HgB A1c, POC Glucose (CBG), BASIC METABOLIC PANEL WITH GFR, Microalbumin / creatinine urine ratio, Semaglutide,0.25 or 0.5MG /DOS, (OZEMPIC, 0.25 OR 0.5 MG/DOSE,) 2 MG/1.5ML SOPN  Hyperlipidemia associated with type 2 diabetes mellitus ([place]) - Plan: Hepatic function panel, Lipid panel  Encounter for HCV screening test for low risk patient - Plan: Hepatitis C antibody  Routine general medical examination at a health care facility  Fatigue, unspecified type - Plan: Ambulatory referral to Pulmonology  If you need refills please call your pharmacy. Do not use My Chart to request refills or for acute issues that need immediate attention.  Today we added Ozempic. Start with 0.25 mg weekly for 2-3 weeks then 0.5 mg for 3-4 weeks and 1 mg. HgA1C goal < 7.0. Avoid sugar added food:regular soft drinks, energy drinks, and sports drinks. candy. cakes. cookies. pies and cobblers. sweet rolls, pastries, and donuts. fruit drinks, such as fruitades and fruit punch. dairy desserts, such as ice cream  Mediterranean diet has showed benefits for sugar control.  How much and what type of carbohydrate foods are important for managing diabetes. The balance between how much insulin is in your body and the carbohydrate you eat makes a difference in your blood glucose levels.  Fasting blood sugar ideally 130 or less, 2 hours after meals less than 180.  Regular exercise also will help with controlling disease, daily brisk walking as tolerated for 15-30 min definitively will help.   Avoid skipping meals, blood sugar might drop and cause serious problems. Remember checking feet periodically, good dental hygiene, and annual eye exam.  Please be sure medication list is accurate. If a new problem present, please set up appointment sooner than planned today.  At least 150 minutes of moderate  exercise per week, daily brisk walking for 15-30 min is a good exercise option. Healthy diet low in saturated (animal) fats and sweets and consisting of fresh fruits and vegetables, lean meats such as fish and white chicken and whole grains.  - Vaccines:  Tdap vaccine every 10 years.  Shingles vaccine recommended at age 26, could be given after 43 years of age but not sure about insurance coverage.  Pneumonia vaccines: Pneumovax at 62   -Screening recommendations for low/normal risk males:  Screening for diabetes at age 17 and every 3 years. Earlier screening if cardiovascular risk factors.N/A   Lipid screening at 35 and every 3 years. Screening starts in younger males with cardiovascular risk factors.N/A  Colon cancer screening is now at age 60 but your insurance may not cover until age 69 .screening is recommended age 74.  Prostate cancer screening: some controversy, starts usually at 75: Rectal exam and PSA.  Aortic Abdominal Aneurism once between 26 and 19 years old if ever smoker.  Also recommended:  1. Dental visit- Brush and floss your teeth twice daily; visit your dentist twice a year. 2. Eye doctor- Get an eye exam at least every 2 years. 3. Helmet use- Always wear a helmet when riding a bicycle, motorcycle, rollerblading or skateboarding. 4. Safe sex- If you may be exposed to sexually transmitted infections, use a condom. 5. Seat belts- Seat belts can save your live; always wear one. 6. Smoke/Carbon Monoxide detectors- These detectors need to be installed on the appropriate level of your home. Replace batteries at least once a year. 7. Skin cancer- When out in the sun please cover up  and use sunscreen 15 SPF or higher. 8. Violence- If anyone is threatening or hurting you, please tell your healthcare provider.  9. Drink alcohol in moderation- Limit alcohol intake to one drink or less per day. Never drink and drive.

## 2020-05-14 NOTE — Assessment & Plan Note (Signed)
He gained some weight after stopping Victoza. We discussed the importance of consistency with a healthful diet, small changes at the time. ?  OSA.

## 2020-05-14 NOTE — Assessment & Plan Note (Signed)
No changes in current management, will follow labs done today and will give further recommendations accordingly.  

## 2020-05-15 LAB — LIPID PANEL
Cholesterol: 169 mg/dL (ref <200)
HDL: 27 mg/dL — ABNORMAL LOW (ref >=40)
Non-HDL Cholesterol (Calc): 142 mg/dL (calc) — ABNORMAL HIGH (ref <130)
Total CHOL/HDL Ratio: 6.3 (calc) — ABNORMAL HIGH (ref <5.0)
Triglycerides: 598 mg/dL — ABNORMAL HIGH (ref <150)

## 2020-05-15 LAB — HEPATIC FUNCTION PANEL
AG Ratio: 1.3 (calc) (ref 1.0–2.5)
ALT: 38 U/L (ref 9–46)
AST: 23 U/L (ref 10–40)
Albumin: 3.9 g/dL (ref 3.6–5.1)
Alkaline phosphatase (APISO): 90 U/L (ref 36–130)
Bilirubin, Direct: 0.1 mg/dL (ref 0.0–0.2)
Globulin: 3.1 g/dL (calc) (ref 1.9–3.7)
Indirect Bilirubin: 0.2 mg/dL (calc) (ref 0.2–1.2)
Total Bilirubin: 0.3 mg/dL (ref 0.2–1.2)
Total Protein: 7 g/dL (ref 6.1–8.1)

## 2020-05-15 LAB — HEPATITIS C ANTIBODY
Hepatitis C Ab: NONREACTIVE
SIGNAL TO CUT-OFF: 0.03 (ref <1.00)

## 2020-05-15 LAB — BASIC METABOLIC PANEL WITH GFR
BUN: 13 mg/dL (ref 7–25)
CO2: 24 mmol/L (ref 20–32)
Calcium: 9.5 mg/dL (ref 8.6–10.3)
Chloride: 101 mmol/L (ref 98–110)
Creat: 0.93 mg/dL (ref 0.60–1.35)
GFR, Est African American: 116 mL/min/{1.73_m2} (ref >=60)
GFR, Est Non African American: 100 mL/min/{1.73_m2} (ref >=60)
Glucose, Bld: 233 mg/dL — ABNORMAL HIGH (ref 65–99)
Potassium: 4.2 mmol/L (ref 3.5–5.3)
Sodium: 137 mmol/L (ref 135–146)

## 2020-05-15 LAB — MICROALBUMIN / CREATININE URINE RATIO
Creatinine, Urine: 200 mg/dL (ref 20–320)
Microalb Creat Ratio: 148 mcg/mg creat — ABNORMAL HIGH (ref <30)
Microalb, Ur: 29.5 mg/dL

## 2020-05-15 MED ORDER — OMEGA-3-ACID ETHYL ESTERS 1 G PO CAPS: 1.0000 g | ORAL_CAPSULE | Freq: Two times a day (BID) | ORAL | 1 refills | Status: DC

## 2020-05-17 ENCOUNTER — Encounter: Payer: Self-pay | Admitting: Family Medicine

## 2020-05-20 ENCOUNTER — Other Ambulatory Visit: Payer: Self-pay | Admitting: Family Medicine

## 2020-05-20 MED ORDER — VICTOZA 18 MG/3ML ~~LOC~~ SOPN: PEN_INJECTOR | SUBCUTANEOUS | 3 refills | Status: DC

## 2020-06-12 ENCOUNTER — Encounter: Payer: Self-pay | Admitting: Pulmonary Disease

## 2020-06-12 ENCOUNTER — Other Ambulatory Visit: Payer: Self-pay

## 2020-06-12 ENCOUNTER — Ambulatory Visit: Payer: 59 | Admitting: Pulmonary Disease

## 2020-06-12 VITALS — BP 122/70 | HR 83 | Temp 97.4°F | Ht 72.25 in | Wt 388.4 lb

## 2020-06-12 DIAGNOSIS — R0683 Snoring: Secondary | ICD-10-CM | POA: Diagnosis not present

## 2020-06-12 DIAGNOSIS — R5382 Chronic fatigue, unspecified: Secondary | ICD-10-CM

## 2020-06-12 NOTE — Addendum Note (Signed)
Addended by: Amado Coe on: 06/12/2020 10:45 AM   Modules accepted: Orders

## 2020-06-12 NOTE — Patient Instructions (Signed)
Moderate probability of significant obstructive sleep apnea  We will schedule you for home sleep study We will update you as soon as results are available  We will initiate CPAP treatment if you have significant sleep apnea  Call with significant concerns  Follow-up in 3 months Sleep Apnea Sleep apnea affects breathing during sleep. It causes breathing to stop for a short time or to become shallow. It can also increase the risk of:  Heart attack.  Stroke.  Being very overweight (obese).  Diabetes.  Heart failure.  Irregular heartbeat. The goal of treatment is to help you breathe normally again. What are the causes? There are three kinds of sleep apnea:  Obstructive sleep apnea. This is caused by a blocked or collapsed airway.  Central sleep apnea. This happens when the brain does not send the right signals to the muscles that control breathing.  Mixed sleep apnea. This is a combination of obstructive and central sleep apnea. The most common cause of this condition is a collapsed or blocked airway. This can happen if:  Your throat muscles are too relaxed.  Your tongue and tonsils are too large.  You are overweight.  Your airway is too small. What increases the risk?  Being overweight.  Smoking.  Having a small airway.  Being older.  Being male.  Drinking alcohol.  Taking medicines to calm yourself (sedatives or tranquilizers).  Having family members with the condition. What are the signs or symptoms?  Trouble staying asleep.  Being sleepy or tired during the day.  Getting angry a lot.  Loud snoring.  Headaches in the morning.  Not being able to focus your mind (concentrate).  Forgetting things.  Less interest in sex.  Mood swings.  Personality changes.  Feelings of sadness (depression).  Waking up a lot during the night to pee (urinate).  Dry mouth.  Sore throat. How is this diagnosed?  Your medical history.  A physical  exam.  A test that is done when you are sleeping (sleep study). The test is most often done in a sleep lab but may also be done at home. How is this treated?   Sleeping on your side.  Using a medicine to get rid of mucus in your nose (decongestant).  Avoiding the use of alcohol, medicines to help you relax, or certain pain medicines (narcotics).  Losing weight, if needed.  Changing your diet.  Not smoking.  Using a machine to open your airway while you sleep, such as: ? An oral appliance. This is a mouthpiece that shifts your lower jaw forward. ? A CPAP device. This device blows air through a mask when you breathe out (exhale). ? An EPAP device. This has valves that you put in each nostril. ? A BPAP device. This device blows air through a mask when you breathe in (inhale) and breathe out.  Having surgery if other treatments do not work. It is important to get treatment for sleep apnea. Without treatment, it can lead to:  High blood pressure.  Coronary artery disease.  In men, not being able to have an erection (impotence).  Reduced thinking ability. Follow these instructions at home: Lifestyle  Make changes that your doctor recommends.  Eat a healthy diet.  Lose weight if needed.  Avoid alcohol, medicines to help you relax, and some pain medicines.  Do not use any products that contain nicotine or tobacco, such as cigarettes, e-cigarettes, and chewing tobacco. If you need help quitting, ask your doctor. General instructions  Take over-the-counter and prescription medicines only as told by your doctor.  If you were given a machine to use while you sleep, use it only as told by your doctor.  If you are having surgery, make sure to tell your doctor you have sleep apnea. You may need to bring your device with you.  Keep all follow-up visits as told by your doctor. This is important. Contact a doctor if:  The machine that you were given to use during sleep bothers  you or does not seem to be working.  You do not get better.  You get worse. Get help right away if:  Your chest hurts.  You have trouble breathing in enough air.  You have an uncomfortable feeling in your back, arms, or stomach.  You have trouble talking.  One side of your body feels weak.  A part of your face is hanging down. These symptoms may be an emergency. Do not wait to see if the symptoms will go away. Get medical help right away. Call your local emergency services (911 in the [place] Do not drive yourself to the hospital. Summary  This condition affects breathing during sleep.  The most common cause is a collapsed or blocked airway.  The goal of treatment is to help you breathe normally while you sleep. This information is not intended to replace advice given to you by your health care provider. Make sure you discuss any questions you have with your health care provider. Document Revised: 05/12/2018 Document Reviewed: 03/21/2018 Elsevier Patient Education  [place].

## 2020-06-12 NOTE — Progress Notes (Signed)
Miguel Sanders    [id number]    [birth year]  Primary Care Physician:Jordan, Malka So, MD  Referring Physician: [place], Betty G, [address] [place],  [place] [id number]  Chief complaint:   Nonrestorative sleep Multiple awakenings  HPI:  Patient with multiple awakenings Nonrestorative sleep History of snoring No witnessed apneas  Wakes up 5-7 times a night Usually goes to bed between 10 and 11 About 30 minutes an hour to fall asleep Final wake up time about 6:30 in the morning  Weight is up about 20 to 30 pounds  Has a history of diabetes, hyper cholesterolemia Back surgery 2010  Reformed smoker  Admits to dryness of his mouth Occasional headaches, ascribes the occasional headache to neck pain  Memory is poor  No family history of obstructive sleep apnea   Outpatient Encounter Medications as of 06/12/2020  Medication Sig  . allopurinol (ZYLOPRIM) 100 MG tablet TAKE 1 TABLET(100 MG) BY MOUTH DAILY  . cyclobenzaprine (FLEXERIL) 10 MG tablet Take 1 tablet (10 mg total) by mouth 3 (three) times daily as needed for muscle spasms.  . Insulin Pen Needle (B-D UF III MINI PEN NEEDLES) 31G X 5 MM MISC USE DAILY AS DIRECTED  . liraglutide (VICTOZA) 18 MG/3ML SOPN Start with 0.6 mg daily and increase to 1.2 in 3 weeks, then increase to 1.8 if well tolerated.  Marland Kitchen MITIGARE 0.6 MG CAPS TAKE 1 TABLET BY MOUTH DAILY AS NEEDED. HOLD ATORVASTAIN WHEN TAKING THIS MEDICATION  . omega-3 acid ethyl esters (LOVAZA) 1 g capsule Take 1 capsule (1 g total) by mouth 2 (two) times daily.  . pravastatin (PRAVACHOL) 40 MG tablet TAKE 1 TABLET BY MOUTH EVERY DAY   No facility-administered encounter medications on file as of 06/12/2020.    Allergies as of 06/12/2020  . (No Known Allergies)    Past Medical History:  Diagnosis Date  . Diabetes mellitus without complication ([place])   . Gout   . Hyperlipidemia   . Obesity, Class III, BMI 40-49.9 (morbid obesity) ([place])      Past Surgical History:  Procedure Laterality Date  . BACK SURGERY    . SPINE SURGERY      Family History  Problem Relation Age of Onset  . Arthritis Mother   . Colon cancer Maternal Grandfather 7  . Cancer Other   . Hypertension Other     Social History   Socioeconomic History  . Marital status: Married    Spouse name: Not on file  . Number of children: 2  . Years of education: Not on file  . Highest education level: Not on file  Occupational History  . Occupation: unemployed  Tobacco Use  . Smoking status: Former Smoker    Packs/day: 1.50    Years: 10.00    Pack years: 15.00    Quit date: 08/09/2009    Years since quitting: 10.8  . Smokeless tobacco: Never Used  Substance and Sexual Activity  . Alcohol use: No  . Drug use: No  . Sexual activity: Yes  Other Topics Concern  . Not on file  Social History Narrative   Marital status: married x 5 years. Separated from wife in 08/2012.     Children: 2 children from previous marriage; gets children every two weeks; sees more frequently.     Lives: alone; separated from wife in 08/2012.     Employment: Glass blower/designer.      Tobacco: quit; smoked x 13  years.      Alcohol: once every three months.      Drugs: none now; marijuana in past.      Exercise: started going to gym in 08/2012; going 4-5 days per week; elliptical and stationary bike.            Social Determinants of Health   Financial Resource Strain:   . Difficulty of Paying Living Expenses: Not on file  Food Insecurity:   . Worried About Charity fundraiser in the Last Year: Not on file  . Ran Out of Food in the Last Year: Not on file  Transportation Needs:   . Lack of Transportation (Medical): Not on file  . Lack of Transportation (Non-Medical): Not on file  Physical Activity:   . Days of Exercise per Week: Not on file  . Minutes of Exercise per Session: Not on file  Stress:   . Feeling of Stress : Not on file  Social Connections:   . Frequency  of Communication with Friends and Family: Not on file  . Frequency of Social Gatherings with Friends and Family: Not on file  . Attends Religious Services: Not on file  . Active Member of Clubs or Organizations: Not on file  . Attends Archivist Meetings: Not on file  . Marital Status: Not on file  Intimate Partner Violence:   . Fear of Current or Ex-Partner: Not on file  . Emotionally Abused: Not on file  . Physically Abused: Not on file  . Sexually Abused: Not on file    Review of Systems  Constitutional: Positive for fatigue.  Psychiatric/Behavioral: Positive for sleep disturbance.    Vitals:   06/12/20 1006  BP: 122/70  Pulse: 83  Temp: (!) 97.4 F (36.3 C)  SpO2: 98%     Physical Exam Constitutional:      Appearance: He is obese.  HENT:     Nose: No congestion.     Mouth/Throat:     Mouth: Mucous membranes are moist.     Comments: Mallampati 4, crowded oropharynx Neck:     Comments: Neck is thick Cardiovascular:     Rate and Rhythm: Normal rate and regular rhythm.     Heart sounds: No murmur heard.  No friction rub.  Pulmonary:     Effort: No respiratory distress.     Breath sounds: Normal breath sounds. No stridor. No wheezing or rhonchi.  Musculoskeletal:     Cervical back: No rigidity or tenderness.  Neurological:     Mental Status: He is alert.  Psychiatric:        Mood and Affect: Mood normal.    Results of the Epworth flowsheet 06/12/2020  Sitting and reading 1  Watching TV 1  Sitting, inactive in a public place (e.g. a theatre or a meeting) 1  As a passenger in a car for an hour without a break 1  Lying down to rest in the afternoon when circumstances permit 1  Sitting and talking to someone 0  Sitting quietly after a lunch without alcohol 0  In a car, while stopped for a few minutes in traffic 0  Total score 5    Data Reviewed: Recent labs reviewed  Assessment:  Moderate probability of significant obstructive sleep  apnea  Morbid obesity  Daytime fatigue  Pathophysiology of sleep disordered breathing discussed with the patient Treatment options for sleep disordered breathing discussed with the patient  Plan/Recommendations: Risks of not treating sleep disordered breathing discussed with patient  Schedule patient for home sleep   I will see patient back in about 3 months  Encouraged to call with any significant concerns   Sherrilyn Rist MD [facility] 06/12/2020, 10:31 AM  CC: [place], Betty G, MD

## 2020-06-19 ENCOUNTER — Other Ambulatory Visit: Payer: Self-pay

## 2020-06-19 ENCOUNTER — Ambulatory Visit: Payer: 59

## 2020-06-19 DIAGNOSIS — R0683 Snoring: Secondary | ICD-10-CM

## 2020-06-19 DIAGNOSIS — R5382 Chronic fatigue, unspecified: Secondary | ICD-10-CM

## 2020-06-19 DIAGNOSIS — G4733 Obstructive sleep apnea (adult) (pediatric): Secondary | ICD-10-CM

## 2020-06-23 ENCOUNTER — Telehealth: Payer: Self-pay | Admitting: Pulmonary Disease

## 2020-06-23 DIAGNOSIS — R0683 Snoring: Secondary | ICD-10-CM

## 2020-06-23 DIAGNOSIS — G4733 Obstructive sleep apnea (adult) (pediatric): Secondary | ICD-10-CM

## 2020-06-23 NOTE — Telephone Encounter (Signed)
Call patient  Sleep study result  Date of study: 06/19/2020  Impression: Mild obstructive sleep apnea Mild oxygen desaturations  Recommendation: DME referral  Recommend CPAP therapy for mild obstructive sleep apnea  Auto titrating CPAP with pressure settings of 5-15 will be appropriate  An oral device may also be considered as an option of treatment for mild sleep disordered breathing, this will entail referral to a dentist  Encourage weight loss measures  Follow-up in the office 4 to 6 weeks following initiation of treatment

## 2020-06-23 NOTE — Telephone Encounter (Signed)
Called spoke with patient . Let him know Dr. Judson Roch recommendations  Patient wanted to start with the CPAP  Order placed Follow appt made.  Nothing further needed at this time.

## 2020-07-31 ENCOUNTER — Encounter: Payer: Self-pay | Admitting: Pulmonary Disease

## 2020-07-31 ENCOUNTER — Other Ambulatory Visit: Payer: Self-pay

## 2020-07-31 ENCOUNTER — Ambulatory Visit: Payer: 59 | Admitting: Pulmonary Disease

## 2020-07-31 VITALS — BP 138/82 | HR 83 | Temp 98.6°F | Ht 73.0 in | Wt 390.5 lb

## 2020-07-31 DIAGNOSIS — G4733 Obstructive sleep apnea (adult) (pediatric): Secondary | ICD-10-CM | POA: Diagnosis not present

## 2020-07-31 DIAGNOSIS — Z0000 Encounter for general adult medical examination without abnormal findings: Secondary | ICD-10-CM

## 2020-07-31 NOTE — Patient Instructions (Addendum)
You were seen today by Lauraine Rinne, NP  for:   1. OSA (obstructive sleep apnea)  We recommend that you continue using your CPAP daily >>>Keep up the hard work using your device >>> Goal should be wearing this for the entire night that you are sleeping, at least 4 to 6 hours  Remember:  . Do not drive or operate heavy machinery if tired or drowsy.  . Please notify the supply company and office if you are unable to use your device regularly due to missing supplies or machine being broken.  . Work on maintaining a healthy weight and following your recommended nutrition plan  . Maintain proper daily exercise and movement  . Maintaining proper use of your device can also help improve management of other chronic illnesses such as: Blood pressure, blood sugars, and weight management.   BiPAP/ CPAP Cleaning:  >>>Clean weekly, with Dawn soap, and bottle brush.  Set up to air dry. >>> Wipe mask out daily with wet wipe or towelette   2. Healthcare maintenance  Would recommend you receive the COVID-19 vaccinations, you declined this today  Would recommend the seasonal flu vaccination, you declined this today  Would recommend Pneumovax 23 since you are a type II diabetic, you declined this today  We can respect your decision to decline these vaccinations just understand that with declining these reporting yourself at high risk of complications should you contract COVID-19, influenza, or bacterial pneumonia  Would encourage that you continue to discuss this with primary care  Also continue to work with primary care on working to reduce your BMI  Follow Up:    Return in about 6 weeks (around 09/11/2020), or if symptoms worsen or fail to improve, for Follow up with Dr. Ander Slade, Follow up with Wyn Quaker FNP-C. TELEVISIT IS OKAY   Notification of test results are managed in the following manner: If there are  any recommendations or changes to the  plan of care discussed in office today,  we will  contact you and let you know what they are. If you do not hear from [place], then your results are normal and you can view them through your  MyChart account , or a letter will be sent to you. Thank you again for trusting [place] with your care  - Thank you, [facility]    It is flu season:   >>> Best ways to protect herself from the flu: Receive the yearly flu vaccine, practice good hand hygiene washing with soap and also using hand sanitizer when available, eat a nutritious meals, get adequate rest, hydrate appropriately       Please contact the office if your symptoms worsen or you have concerns that you are not improving.   Thank you for choosing [facility] for your healthcare, and for allowing [place] to partner with you on your healthcare journey. I am thankful to be able to provide care to you today.   Wyn Quaker FNP-C

## 2020-07-31 NOTE — Progress Notes (Signed)
@Patient  ID: Miguel Sanders, male    DOB: Jan 24, 1976, 44 y.o.   MRN: [id number]  Chief Complaint  Patient presents with  . Follow-up    Started using the cpap on dec 10.  Been doing well with this.     Referring provider: [place], Betty G, MD  HPI:  44 year old male former smoker found our office for mild obstructive sleep apnea  PMH: Type 2 diabetes, morbid obesity, chronic fatigue, GERD, hyperlipidemia Smoker/ Smoking History: Former smoker.  Quit 2011.  15-pack-year smoking history Maintenance: None Pt of: Dr. Jenetta Downer  07/31/2020  - Visit   44 year old male former smoker followed in our office for obstructive sleep apnea.  Patient recently started CPAP therapy.  CPAP compliance report shown below:  07/18/2020-07/30/2020-12 out of last 12 days use, all 12 of those days greater than 4 hours, average usage 7 hours and 5 minutes, APAP setting 5-15, AHI 0.4  Patient was started on CPAP therapy based off of sleep study results from 06/19/2020 Home sleep study that showed mild obstructive sleep apnea with an AHI of 12.1.  Patient reports no issues with utilizing his CPAP.  He is still adjusting to using it every night but overall is tolerating well.  He is using AirFit I 30 mask.  He likes this.  Patient remains unvaccinated to COVID-19, the seasonal flu vaccine as well as Pneumovax 23.  He reports that primary care is already recommended these vaccinations form he continues to decline them.  He is unable to verbalize exactly why he declines these vaccinations other than "I just do not want them".   Questionaires / Pulmonary Flowsheets:   ACT:  No flowsheet data found.  MMRC: No flowsheet data found.  Epworth:  Results of the Epworth flowsheet 06/12/2020  Sitting and reading 1  Watching TV 1  Sitting, inactive in a public place (e.g. a theatre or a meeting) 1  As a passenger in a car for an hour without a break 1  Lying down to rest in the afternoon when circumstances permit 1   Sitting and talking to someone 0  Sitting quietly after a lunch without alcohol 0  In a car, while stopped for a few minutes in traffic 0  Total score 5    Tests:   FENO:  No results found for: NITRICOXIDE  PFT: No flowsheet data found.  WALK:  No flowsheet data found.  Imaging: No results found.  Lab Results:  CBC    Component Value Date/Time   WBC 6.0 04/28/2020 1438   RBC 5.07 04/28/2020 1438   HGB 15.6 04/28/2020 1438   HCT 45.9 04/28/2020 1438   PLT 201 04/28/2020 1438   MCV 90.5 04/28/2020 1438   MCV 91.3 06/08/2012 1340   MCH 30.8 04/28/2020 1438   MCHC 34.0 04/28/2020 1438   RDW 12.4 04/28/2020 1438   LYMPHSABS 1.9 01/16/2013 0911   MONOABS 0.4 01/16/2013 0911   EOSABS 0.2 01/16/2013 0911   BASOSABS 0.0 01/16/2013 0911    BMET    Component Value Date/Time   NA 137 05/14/2020 0749   K 4.2 05/14/2020 0749   CL 101 05/14/2020 0749   CO2 24 05/14/2020 0749   GLUCOSE 233 (H) 05/14/2020 0749   BUN 13 05/14/2020 0749   CREATININE 0.93 05/14/2020 0749   CALCIUM 9.5 05/14/2020 0749   GFRNONAA 100 05/14/2020 0749   GFRAA 116 05/14/2020 0749    BNP No results found for: BNP  ProBNP No results found for: PROBNP  Specialty Problems      Pulmonary Problems   OSA (obstructive sleep apnea)    06/19/2020-home sleep study-mild obstructive sleep apnea-AHI 12.1          No Known Allergies  Immunization History  Administered Date(s) Administered  . Tdap 05/01/2018     Past Medical History:  Diagnosis Date  . Diabetes mellitus without complication ([facility])   . Gout   . Hyperlipidemia   . Obesity, Class III, BMI 40-49.9 (morbid obesity) ([facility])     Tobacco History: Social History   Tobacco Use  Smoking Status Former Smoker  . Packs/day: 1.50  . Years: 10.00  . Pack years: 15.00  . Quit date: 08/09/2009  . Years since quitting: 10.9  Smokeless Tobacco Never Used   Counseling given: Not Answered   Continue to not smoke  Outpatient  Encounter Medications as of 07/31/2020  Medication Sig  . allopurinol (ZYLOPRIM) 100 MG tablet TAKE 1 TABLET(100 MG) BY MOUTH DAILY  . Insulin Pen Needle (B-D UF III MINI PEN NEEDLES) 31G X 5 MM MISC USE DAILY AS DIRECTED  . liraglutide (VICTOZA) 18 MG/3ML SOPN Start with 0.6 mg daily and increase to 1.2 in 3 weeks, then increase to 1.8 if well tolerated.  Marland Kitchen MITIGARE 0.6 MG CAPS TAKE 1 TABLET BY MOUTH DAILY AS NEEDED. HOLD ATORVASTAIN WHEN TAKING THIS MEDICATION  . omega-3 acid ethyl esters (LOVAZA) 1 g capsule Take 1 capsule (1 g total) by mouth 2 (two) times daily.  . pravastatin (PRAVACHOL) 40 MG tablet TAKE 1 TABLET BY MOUTH EVERY DAY  . [DISCONTINUED] cyclobenzaprine (FLEXERIL) 10 MG tablet Take 1 tablet (10 mg total) by mouth 3 (three) times daily as needed for muscle spasms.   No facility-administered encounter medications on file as of 07/31/2020.     Review of Systems  Review of Systems  Constitutional: Negative for activity change, chills, fatigue, fever and unexpected weight change.  HENT: Negative for postnasal drip, rhinorrhea, sinus pressure, sinus pain and sore throat.   Eyes: Negative.   Respiratory: Negative for cough, shortness of breath and wheezing.   Cardiovascular: Negative for chest pain and palpitations.  Gastrointestinal: Negative for constipation, diarrhea, nausea and vomiting.  Endocrine: Negative.   Genitourinary: Negative.   Musculoskeletal: Negative.   Skin: Negative.   Neurological: Negative for dizziness and headaches.  Psychiatric/Behavioral: Negative.  Negative for dysphoric mood. The patient is not nervous/anxious.   All other systems reviewed and are negative.    Physical Exam  BP 138/82   Pulse 83   Temp 98.6 F (37 C) (Tympanic)   Ht 6\' 1"  (1.854 m)   Wt (!) 390 lb 8 oz (177.1 kg)   SpO2 97%   BMI 51.52 kg/m   Wt Readings from Last 5 Encounters:  07/31/20 (!) 390 lb 8 oz (177.1 kg)  06/12/20 (!) 388 lb 6.4 oz (176.2 kg)  05/14/20  (!) 392 lb (177.8 kg)  04/28/20 (!) 388 lb (176 kg)  02/29/20 (!) 391 lb 5 oz (177.5 kg)    BMI Readings from Last 5 Encounters:  07/31/20 51.52 kg/m  06/12/20 52.31 kg/m  05/14/20 51.72 kg/m  04/28/20 51.19 kg/m  02/29/20 51.63 kg/m     Physical Exam Vitals and nursing note reviewed.  Constitutional:      General: He is not in acute distress.    Appearance: Normal appearance. He is obese.  HENT:     Head: Normocephalic and atraumatic.     Right Ear: Hearing, tympanic membrane, ear  canal and external ear normal.     Left Ear: Hearing, tympanic membrane, ear canal and external ear normal.     Nose: No mucosal edema.     Right Turbinates: Not enlarged.     Left Turbinates: Not enlarged.     Mouth/Throat:     Mouth: Mucous membranes are dry.     Pharynx: Oropharynx is clear. No oropharyngeal exudate.     Comments: Mallampati 2 Eyes:     Pupils: Pupils are equal, round, and reactive to light.  Cardiovascular:     Rate and Rhythm: Normal rate and regular rhythm.     Pulses: Normal pulses.     Heart sounds: Normal heart sounds. No murmur heard.   Pulmonary:     Effort: Pulmonary effort is normal.     Breath sounds: Normal breath sounds. No decreased breath sounds, wheezing or rales.  Musculoskeletal:     Cervical back: Normal range of motion.     Right lower leg: No edema.     Left lower leg: No edema.  Lymphadenopathy:     Cervical: No cervical adenopathy.  Skin:    General: Skin is warm and dry.     Capillary Refill: Capillary refill takes less than 2 seconds.     Findings: No erythema or rash.  Neurological:     General: No focal deficit present.     Mental Status: He is alert and oriented to person, place, and time.     Motor: No weakness.     Coordination: Coordination normal.     Gait: Gait is intact. Gait normal.  Psychiatric:        Mood and Affect: Mood normal.        Behavior: Behavior normal. Behavior is cooperative.        Thought Content:  Thought content normal.        Judgment: Judgment normal.       Assessment & Plan:   OSA (obstructive sleep apnea) Reviewed CPAP compliance report today Patient tolerating CPAP therapy well Has only been on CPAP therapy for 12 days Reviewed pathophysiology of obstructive sleep apnea BMI 51.5  Plan: Continue CPAP therapy Follow-up in 6 weeks okay for this to be a televisit Work with primary care on working to reduce your BMI  Healthcare maintenance Plan: Recommend COVID-19 vaccinations, patient declined Recommend seasonal flu vaccine, patient declined Recommend Pneumovax 23 as patient is a type II diabetic, patient declined  Reviewed with patient that by declining these vaccinations he spent himself at a higher risk occasions should he contract COVID-19, seasonal flu or develop bacterial pneumonia.  He is aware of these risk.  He is aware that he is at a higher mortality risk should he contract COVID-19 due to his elevated BMI, type 2 diabetes.    Return in about 6 weeks (around 09/11/2020), or if symptoms worsen or fail to improve, for Follow up with Dr. Ander Slade, Follow up with Wyn Quaker FNP-C.   Lauraine Rinne, NP 07/31/2020   This appointment required 23 minutes of patient care (this includes precharting, chart review, review of results, Sanders-to-Sanders care, etc.).

## 2020-07-31 NOTE — Assessment & Plan Note (Signed)
Plan: Recommend COVID-19 vaccinations, patient declined Recommend seasonal flu vaccine, patient declined Recommend Pneumovax 23 as patient is a type II diabetic, patient declined  Reviewed with patient that by declining these vaccinations he spent himself at a higher risk occasions should he contract COVID-19, seasonal flu or develop bacterial pneumonia.  He is aware of these risk.  He is aware that he is at a higher mortality risk should he contract COVID-19 due to his elevated BMI, type 2 diabetes.

## 2020-07-31 NOTE — Assessment & Plan Note (Signed)
Reviewed CPAP compliance report today Patient tolerating CPAP therapy well Has only been on CPAP therapy for 12 days Reviewed pathophysiology of obstructive sleep apnea BMI 51.5  Plan: Continue CPAP therapy Follow-up in 6 weeks okay for this to be a televisit Work with primary care on working to reduce your BMI

## 2020-08-04 ENCOUNTER — Encounter: Payer: Self-pay | Admitting: Family Medicine

## 2020-08-05 MED ORDER — VICTOZA 18 MG/3ML ~~LOC~~ SOPN
PEN_INJECTOR | SUBCUTANEOUS | 3 refills | Status: DC
Start: 2020-08-05 — End: 2020-10-08

## 2020-09-11 ENCOUNTER — Other Ambulatory Visit: Payer: Self-pay

## 2020-09-11 ENCOUNTER — Encounter: Payer: Self-pay | Admitting: Pulmonary Disease

## 2020-09-11 ENCOUNTER — Ambulatory Visit (INDEPENDENT_AMBULATORY_CARE_PROVIDER_SITE_OTHER): Payer: 59 | Admitting: Pulmonary Disease

## 2020-09-11 DIAGNOSIS — G4733 Obstructive sleep apnea (adult) (pediatric): Secondary | ICD-10-CM | POA: Diagnosis not present

## 2020-09-11 NOTE — Progress Notes (Signed)
Virtual Visit via Telephone Note  I connected with Miguel Sanders on 09/11/20 at  9:00 AM EST by telephone and verified that I am speaking with the correct person using two identifiers.  Location: Patient: Home Provider: [facility] - [address], [place], [place], [place] [address]   I discussed the limitations, risks, security and privacy concerns of performing an evaluation and management service by telephone and the availability of in person appointments. I also discussed with the patient that there may be a patient responsible charge related to this service. The patient expressed understanding and agreed to proceed.  Patient consented to consult via telephone: Yes People present and their role in pt care: Pt     History of Present Illness:  45 year old male former smoker found our office for mild obstructive sleep apnea   PMH: Type 2 diabetes, morbid obesity, chronic fatigue, GERD, hyperlipidemia Smoker/ Smoking History: Former smoker.  Quit 2011.  15-pack-year smoking history Maintenance: None Pt of: Dr. Jenetta Downer  Chief complaint: 6 week CPAP follow up   45 year old male former smoker fonder office for obstructive sleep apnea.  Completing a 6-week telephone visit with our office today.  Patient has successfully been started on CPAP therapy and compliance report shows excellent compliance and a well-controlled AHI.  Patient reports he has no current issues.  He did have a mask that he felt was too tight which then he adjusted on his own accord.  He has no issues or complaints today.  His DME company is [employer].  Observations/Objective:  Social History   Tobacco Use  Smoking Status Former Smoker  . Packs/day: 1.50  . Years: 10.00  . Pack years: 15.00  . Quit date: 08/09/2009  . Years since quitting: 11.0  Smokeless Tobacco Never Used   Immunization History  Administered Date(s) Administered  . Tdap 05/01/2018    Assessment and Plan:  OSA (obstructive  sleep apnea) Reviewed CPAP compliance report today Patient tolerating CPAP therapy well Reviewed pathophysiology of obstructive sleep apnea BMI 51.5  Plan: Continue CPAP therapy Follow-up in 1 year Work with primary care on working to reduce your BMI   Follow Up Instructions:  Return in about 1 year (around 09/11/2021), or if symptoms worsen or fail to improve, for Follow up with Dr. Ander Slade.   I discussed the assessment and treatment plan with the patient. The patient was provided an opportunity to ask questions and all were answered. The patient agreed with the plan and demonstrated an understanding of the instructions.   The patient was advised to call back or seek an in-person evaluation if the symptoms worsen or if the condition fails to improve as anticipated.  I provided 15 minutes of non-Sanders-to-Sanders time during this encounter.   Miguel Rinne, NP

## 2020-09-11 NOTE — Assessment & Plan Note (Addendum)
Reviewed CPAP compliance report today Patient tolerating CPAP therapy well Reviewed pathophysiology of obstructive sleep apnea BMI 51.5  Plan: Continue CPAP therapy Follow-up in 1 year Work with primary care on working to reduce your BMI

## 2020-09-11 NOTE — Addendum Note (Signed)
Addended by: Lorretta Harp on: 09/11/2020 08:59 AM   Modules accepted: Orders

## 2020-09-11 NOTE — Patient Instructions (Addendum)
You were seen today by Coral Ceo, NP  for:   1. OSA (obstructive sleep apnea)  We recommend that you continue using your CPAP daily >>>Keep up the hard work using your device >>> Goal should be wearing this for the entire night that you are sleeping, at least 4 to 6 hours  Remember:  . Do not drive or operate heavy machinery if tired or drowsy.  . Please notify the supply company and office if you are unable to use your device regularly due to missing supplies or machine being broken.  . Work on maintaining a healthy weight and following your recommended nutrition plan  . Maintain proper daily exercise and movement  . Maintaining proper use of your device can also help improve management of other chronic illnesses such as: Blood pressure, blood sugars, and weight management.   BiPAP/ CPAP Cleaning:  >>>Clean weekly, with Dawn soap, and bottle brush.  Set up to air dry. >>> Wipe mask out daily with wet wipe or towelette    Follow Up:    Return in about 1 year (around 09/11/2021), or if symptoms worsen or fail to improve, for Follow up with Dr. Wynona Neat.   Notification of test results are managed in the following manner: If there are  any recommendations or changes to the  plan of care discussed in office today,  we will contact you and let you know what they are. If you do not hear from [place], then your results are normal and you can view them through your  MyChart account , or a letter will be sent to you. Thank you again for trusting [place] with your care  - Thank you, [facility]    It is flu season:   >>> Best ways to protect herself from the flu: Receive the yearly flu vaccine, practice good hand hygiene washing with soap and also using hand sanitizer when available, eat a nutritious meals, get adequate rest, hydrate appropriately       Please contact the office if your symptoms worsen or you have concerns that you are not improving.   Thank you for choosing [facility] for your healthcare, and for allowing [place] to partner with you on your healthcare journey. I am thankful to be able to provide care to you today.   Elisha Headland FNP-C    Sleep Apnea Sleep apnea affects breathing during sleep. It causes breathing to stop for a short time or to become shallow. It can also increase the risk of:  Heart attack.  Stroke.  Being very overweight (obese).  Diabetes.  Heart failure.  Irregular heartbeat. The goal of treatment is to help you breathe normally again. What are the causes? There are three kinds of sleep apnea:  Obstructive sleep apnea. This is caused by a blocked or collapsed airway.  Central sleep apnea. This happens when the brain does not send the right signals to the muscles that control breathing.  Mixed sleep apnea. This is a combination of obstructive and central sleep apnea. The most common cause of this condition is a collapsed or blocked airway. This can happen if:  Your throat muscles are too relaxed.  Your tongue and tonsils are too large.  You are overweight.  Your airway is too small.   What increases the risk?  Being overweight.  Smoking.  Having a small airway.  Being older.  Being male.  Drinking alcohol.  Taking medicines to calm yourself (sedatives or tranquilizers).  Having  family members with the condition. What are the signs or symptoms?  Trouble staying asleep.  Being sleepy or tired during the day.  Getting angry a lot.  Loud snoring.  Headaches in the morning.  Not being able to focus your mind (concentrate).  Forgetting things.  Less interest in sex.  Mood swings.  Personality changes.  Feelings of sadness (depression).  Waking up a lot during the night to pee (urinate).  Dry mouth.  Sore throat. How is this diagnosed?  Your medical history.  A physical exam.  A test that is done when you are sleeping (sleep study). The test is most often done in a sleep  lab but may also be done at home. How is this treated?  Sleeping on your side.  Using a medicine to get rid of mucus in your nose (decongestant).  Avoiding the use of alcohol, medicines to help you relax, or certain pain medicines (narcotics).  Losing weight, if needed.  Changing your diet.  Not smoking.  Using a machine to open your airway while you sleep, such as: ? An oral appliance. This is a mouthpiece that shifts your lower jaw forward. ? A CPAP device. This device blows air through a mask when you breathe out (exhale). ? An EPAP device. This has valves that you put in each nostril. ? A BPAP device. This device blows air through a mask when you breathe in (inhale) and breathe out.  Having surgery if other treatments do not work. It is important to get treatment for sleep apnea. Without treatment, it can lead to:  High blood pressure.  Coronary artery disease.  In men, not being able to have an erection (impotence).  Reduced thinking ability.   Follow these instructions at home: Lifestyle  Make changes that your doctor recommends.  Eat a healthy diet.  Lose weight if needed.  Avoid alcohol, medicines to help you relax, and some pain medicines.  Do not use any products that contain nicotine or tobacco, such as cigarettes, e-cigarettes, and chewing tobacco. If you need help quitting, ask your doctor. General instructions  Take over-the-counter and prescription medicines only as told by your doctor.  If you were given a machine to use while you sleep, use it only as told by your doctor.  If you are having surgery, make sure to tell your doctor you have sleep apnea. You may need to bring your device with you.  Keep all follow-up visits as told by your doctor. This is important. Contact a doctor if:  The machine that you were given to use during sleep bothers you or does not seem to be working.  You do not get better.  You get worse. Get help right away  if:  Your chest hurts.  You have trouble breathing in enough air.  You have an uncomfortable feeling in your back, arms, or stomach.  You have trouble talking.  One side of your body feels weak.  A part of your face is hanging down. These symptoms may be an emergency. Do not wait to see if the symptoms will go away. Get medical help right away. Call your local emergency services (911 in the [place] Do not drive yourself to the hospital. Summary  This condition affects breathing during sleep.  The most common cause is a collapsed or blocked airway.  The goal of treatment is to help you breathe normally while you sleep. This information is not intended to replace advice given to you by your  health care provider. Make sure you discuss any questions you have with your health care provider. Document Revised: 05/12/2018 Document Reviewed: 03/21/2018 Elsevier Patient Education  [place].

## 2020-10-05 ENCOUNTER — Other Ambulatory Visit: Payer: Self-pay | Admitting: Family Medicine

## 2020-10-07 ENCOUNTER — Other Ambulatory Visit: Payer: Self-pay

## 2020-10-07 NOTE — Progress Notes (Signed)
HPI: [name] is a 45 y.o. male, who is here today for 5 months follow up.  He was last seen on 05/14/2020. No new problems since his last visit. DM2: He is on Victoza 1.8 mg daily, he has not taking medication for the past 2 to 3 weeks because changes in his health insurance. He does not feel like Victoza is helping. His BS's were well controlled while he was on Victoza and Jardiance. BS elevated in the two hundreds.  She has not tolerated Metformin, caused GI side effects. Jardiance caused recurrent balanitis.  He is trying to watch what he eats, decreased carbs intake about a week ago. He is not exercising regularly but he has an active job.  Lab Results  Component Value Date   HGBA1C 7.9 (A) 05/14/2020   + Polydipsia, polyphagia, and polyuria. He has not had abdominal pain, nausea, or vomiting.  Lab Results  Component Value Date   CREATININE 0.93 05/14/2020   BUN 13 05/14/2020   NA 137 05/14/2020   K 4.2 05/14/2020   CL 101 05/14/2020   CO2 24 05/14/2020   Lab Results  Component Value Date   MICROALBUR 29.5 05/14/2020   MICROALBUR 9.8 (H) 11/12/2019   He is having another episode of balanopreputial erythema.  He has had intermittent episodes, improved after discontinuing Jardiance. Usually overall treatment with fluconazole helps. He is using topical clotrimazole, he has not had. Negative for new detergent, soap, or a skin problem. Problem seems to be getting worse.  Review of Systems  Constitutional: Positive for fatigue. Negative for activity change, appetite change and fever.  HENT: Negative for nosebleeds and sore throat.   Eyes: Negative for redness and visual disturbance.  Respiratory: Negative for apnea, cough, shortness of breath and wheezing.   Cardiovascular: Negative for chest pain, palpitations and leg swelling.  Genitourinary: Negative for decreased urine volume and hematuria.  Skin: Negative for wound.  Neurological: Negative for  syncope, weakness and headaches.  Rest of ROS, see pertinent positives sand negatives in HPI  Current Outpatient Medications on File Prior to Visit  Medication Sig Dispense Refill  . allopurinol (ZYLOPRIM) 100 MG tablet TAKE 1 TABLET(100 MG) BY MOUTH DAILY 90 tablet 3  . MITIGARE 0.6 MG CAPS TAKE 1 TABLET BY MOUTH DAILY AS NEEDED. HOLD ATORVASTAIN WHEN TAKING THIS MEDICATION 30 capsule 3  . omega-3 acid ethyl esters (LOVAZA) 1 g capsule Take 1 capsule (1 g total) by mouth 2 (two) times daily. 180 capsule 1  . pravastatin (PRAVACHOL) 40 MG tablet TAKE 1 TABLET BY MOUTH EVERY DAY 90 tablet 3   No current facility-administered medications on file prior to visit.   Past Medical History:  Diagnosis Date  . Diabetes mellitus without complication ([place])   . Gout   . Hyperlipidemia   . Obesity, Class III, BMI 40-49.9 (morbid obesity) ([facility])    No Known Allergies  Social History   Socioeconomic History  . Marital status: Married    Spouse name: Not on file  . Number of children: 2  . Years of education: Not on file  . Highest education level: Not on file  Occupational History  . Occupation: unemployed  Tobacco Use  . Smoking status: Former Smoker    Packs/day: 1.50    Years: 10.00    Pack years: 15.00    Quit date: 08/09/2009    Years since quitting: 11.1  . Smokeless tobacco: Never Used  Substance and Sexual Activity  . Alcohol  use: No  . Drug use: No  . Sexual activity: Yes  Other Topics Concern  . Not on file  Social History Narrative   Marital status: married x 5 years. Separated from wife in 08/2012.     Children: 2 children from previous marriage; gets children every two weeks; sees more frequently.     Lives: alone; separated from wife in 08/2012.     Employment: Glass blower/designer.      Tobacco: quit; smoked x 13 years.      Alcohol: once every three months.      Drugs: none now; marijuana in past.      Exercise: started going to gym in 08/2012; going 4-5 days per week;  elliptical and stationary bike.            Social Determinants of Health   Financial Resource Strain: Not on file  Food Insecurity: Not on file  Transportation Needs: Not on file  Physical Activity: Not on file  Stress: Not on file  Social Connections: Not on file   Vitals:   10/08/20 0726  BP: 130/80  Pulse: 91  Resp: 16  SpO2: 97%   Body mass index is 50.4 kg/m.  Wt Readings from Last 3 Encounters:  10/08/20 (!) 382 lb (173.3 kg)  07/31/20 (!) 390 lb 8 oz (177.1 kg)  06/12/20 (!) 388 lb 6.4 oz (176.2 kg)   Physical Exam Vitals and nursing note reviewed.  Constitutional:      General: He is not in acute distress.    Appearance: He is well-developed.  HENT:     Head: Normocephalic and atraumatic.     Mouth/Throat:     Mouth: Mucous membranes are dry.  Eyes:     Conjunctiva/sclera: Conjunctivae normal.  Cardiovascular:     Rate and Rhythm: Normal rate and regular rhythm.     Pulses:          Dorsalis pedis pulses are 2+ on the right side and 2+ on the left side.     Heart sounds: No murmur heard.   Pulmonary:     Effort: Pulmonary effort is normal. No respiratory distress.     Breath sounds: Normal breath sounds.  Abdominal:     Palpations: Abdomen is soft. There is no hepatomegaly or mass.     Tenderness: There is no abdominal tenderness.  Genitourinary:    Comments: Recurrent problem,so genital exam deferred for next visit if not improvement. Lymphadenopathy:     Cervical: No cervical adenopathy.  Skin:    General: Skin is warm.     Findings: No erythema or rash.  Neurological:     General: No focal deficit present.     Mental Status: He is alert and oriented to person, place, and time.     Cranial Nerves: No cranial nerve deficit.     Gait: Gait normal.  Psychiatric:     Comments: Well groomed, good eye contact.   ASSESSMENT AND PLAN:  [name] was seen today for 5 months follow-up.  Orders Placed This Encounter  Procedures  .  POC HgB A1c   Lab Results  Component Value Date   HGBA1C 10.3 (A) 10/08/2020   Candidal balanitis Recurrent. We discussed possible etiologies. Most likely a complication of hyperglycemic state; therefore a better control glucose will help with problem. Oral treatment with Diflucan 100 mg daily for 7 days. Lotrisone on affected area twice daily as needed.  Type II diabetes mellitus with neurological manifestations ([place])  Problem is not well controlled, HgA1C went from 7.9 to 10.3. Recommend continuing Victoza 1.8 mg daily, he is not sure about insurance coverage, he will let me know. We discussed other pharmacologic options. Basaglar added today, samples given, instructed to start with 15 units daily. He can increase Basaglar dose to 20 units daily if BS > 180's in 10 days. We discussed some side effects of medication, including the risk of hypoglycemic events. Continue monitoring BS. Regular exercise and healthy diet with avoidance of added sugar food intake is an important part of treatment and recommended. Reviewed possible complications of elevated BS's. Annual eye exam is overdue. Periodic dental and foot care also recommended. F/U in 3-4 months   Morbid obesity with BMI of 50.0-59.9, adult ([place]) He understands benefits of wt loss as well as adverse effects of obesity. Some wt loss since his last visit, which could be related with poorly controlled diabetes. Consistency with healthy diet and physical activity recommended.  Spent 41 minutes.  During this time history was obtained,, examination was performed, prior labs reviewed,assessment/plan discussed, and documentation completed.  Return in about 3 months (around 01/08/2021) for DM II.  Adyn Hoes G. [place], MD  [facility]. [place] office.   A few things to remember from today's visit:   Type II diabetes mellitus with neurological manifestations ([place]) - Plan: POC HgB A1c  Candidal balanitis  If you need  refills please call your pharmacy. Do not use My Chart to request refills or for acute issues that need immediate attention.   Lotrisone small amount on affected area (yeast) and oral medication for 7 days. Keep area clean with soap and water.  Basalglar started today 15 U daily, if blood sugar still > 180 in 10 days you can increase dose to 20 U. No changes in Victoza.  Please be sure medication list is accurate. If a new problem present, please set up appointment sooner than planned today.   Ferri's Clinical Advisor 2018 (pp. 171-171.e1). [place], [place]: Elsevier.">  Balanitis  Balanitis is swelling and irritation of the head of the penis (glans penis). Balanitis occurs most often among males who have not had their foreskin removed (uncircumcised). In uncircumcised males, the condition may also cause inflammation of the skin around the foreskin. Balanitis sometimes causes scarring of the penis or foreskin, which can require surgery. This condition may develop because of an infection or another medical condition. Untreated balanitis can increase the risk of penile cancer. What are the causes? Common causes of this condition include:  Poor personal hygiene, especially in uncircumcised males. Not cleaning the glans penis and foreskin well can result in a buildup of bacteria, viruses, and yeast, which can lead to infection and inflammation.  Irritation and lack of air flow due to fluid (smegma) that can build up on the glans penis. Other causes include:  Chemical irritation from products such as soaps or shower gels, especially those that have fragrance. Chemical irritation can also be caused by condoms, personal lubricants, petroleum jelly, spermicides, or fabric softeners.  Skin conditions, such as eczema, dermatitis, and psoriasis.  Allergies to medicines, such as tetracycline and sulfa drugs. What increases the risk? The following factors may make you more likely to develop this  condition:  Being an uncircumcised male.  Having diabetes.  Having other medical conditions, including liver cirrhosis, congestive heart failure, or kidney disease.  Having infections, such as candidiasis, HPV (human papillomavirus), herpes simplex, gonorrhea, or syphilis.  Having a tight foreskin that is difficult  to pull back (retract) past the glans penis.  Being severely obese. What are the signs or symptoms? Symptoms of this condition include:  Discharge from under the foreskin, and pain or difficulty retracting the foreskin.  A bad smell or itchiness on the penis.  Tenderness, redness, and swelling of the glans penis.  A rash or sores on the glans penis or foreskin.  Inability to get an erection due to pain.  Difficulty urinating.  Scarring of the penis or foreskin, in some cases. How is this diagnosed? This condition may be diagnosed based on a physical exam and tests of a swab of discharge to check for bacterial or fungal infection. You may also have blood tests to check for:  Viruses that can cause balanitis.  A high blood sugar (glucose) level. This could be a sign of diabetes, which can increase the risk of balanitis. How is this treated? Treatment for balanitis depends on the cause. Treatment may include:  Improving personal hygiene. Your health care provider may recommend sitting in a bath of warm water that is deep enough to cover your hips and buttocks (sitz bath).  Taking medicines such as: ? Creams or ointments to reduce swelling (steroids) or to treat an infection. ? Antibiotic medicine. ? Antifungal medicine.  Having surgery to remove or cut the foreskin (circumcision). This may be done if you have scarring on the foreskin that makes it difficult to retract.  Controlling other medical problems that may be causing your condition or making it worse. Follow these instructions at home: Medicines  Take over-the-counter and prescription medicines only  as told by your health care provider.  If you were prescribed an antibiotic medicine or a cream or ointment, use it as told by your health care provider. Do not stop using your medicine, cream, or ointment even if you start to feel better. General instructions  Do not have sex until the condition clears up, or until your health care provider approves.  Keep your penis clean and dry. Take sitz baths as recommended by your health care provider.  Avoid products that irritate your skin or make symptoms worse, such as soaps and shower gels that have fragrance.  Keep all follow-up visits as told by your health care provider. This is important. Contact a health care provider if:  Your symptoms get worse or do not improve with home care.  You develop chills or a fever.  You have trouble urinating.  You cannot retract your foreskin. Get help right away if:  You develop severe pain.  You are unable to urinate. Summary  Balanitis is inflammation of the head of the penis (glans penis) caused by irritation or infection. This condition is most common among uncircumcised males.  Balanitis causes pain, redness, and swelling of the glans penis.  Good personal hygiene is important.  Treatment may include improving personal hygiene and applying creams or ointments.  Contact a health care provider if your symptoms get worse or do not improve with home care. This information is not intended to replace advice given to you by your health care provider. Make sure you discuss any questions you have with your health care provider. Document Revised: 05/16/2019 Document Reviewed: 05/16/2019 Elsevier Patient Education  [place].

## 2020-10-08 ENCOUNTER — Telehealth: Payer: Self-pay | Admitting: Family Medicine

## 2020-10-08 ENCOUNTER — Ambulatory Visit: Payer: BC Managed Care – PPO | Admitting: Family Medicine

## 2020-10-08 ENCOUNTER — Encounter: Payer: Self-pay | Admitting: Family Medicine

## 2020-10-08 VITALS — BP 130/80 | HR 91 | Resp 16 | Ht 73.0 in | Wt 382.0 lb

## 2020-10-08 DIAGNOSIS — Z6841 Body Mass Index (BMI) 40.0 and over, adult: Secondary | ICD-10-CM

## 2020-10-08 DIAGNOSIS — E1149 Type 2 diabetes mellitus with other diabetic neurological complication: Secondary | ICD-10-CM | POA: Diagnosis not present

## 2020-10-08 DIAGNOSIS — B3742 Candidal balanitis: Secondary | ICD-10-CM

## 2020-10-08 LAB — POCT GLYCOSYLATED HEMOGLOBIN (HGB A1C): HbA1c, POC (controlled diabetic range): 10.3 % — AB (ref 0.0–7.0)

## 2020-10-08 MED ORDER — VICTOZA 18 MG/3ML ~~LOC~~ SOPN: PEN_INJECTOR | SUBCUTANEOUS | 3 refills | Status: DC

## 2020-10-08 MED ORDER — CLOTRIMAZOLE-BETAMETHASONE 1-0.05 % EX CREA: 1.0000 "application " | TOPICAL_CREAM | Freq: Two times a day (BID) | CUTANEOUS | 1 refills | Status: DC

## 2020-10-08 MED ORDER — FLUCONAZOLE 100 MG PO TABS: 100.0000 mg | ORAL_TABLET | Freq: Every day | ORAL | 0 refills | Status: AC

## 2020-10-08 MED ORDER — BD PEN NEEDLE MINI U/F 31G X 5 MM MISC: 3 refills | Status: DC

## 2020-10-08 MED ORDER — BASAGLAR KWIKPEN 100 UNIT/ML ~~LOC~~ SOPN: 15.0000 [IU] | PEN_INJECTOR | Freq: Every day | SUBCUTANEOUS | 2 refills | Status: DC

## 2020-10-08 NOTE — Telephone Encounter (Signed)
Pt is calling in stating that he went to the pharmacy for the insulin and they told him that he needs a PA for it and they had faxed over the form for it and he wanted to see if we had received it.

## 2020-10-08 NOTE — Telephone Encounter (Signed)
Pharmacy is aware & is processing Rx for pt.

## 2020-10-08 NOTE — Patient Instructions (Addendum)
A few things to remember from today's visit:   Type II diabetes mellitus with neurological manifestations ([place]) - Plan: POC HgB A1c  Candidal balanitis  If you need refills please call your pharmacy. Do not use My Chart to request refills or for acute issues that need immediate attention.   Lotrisone small amount on affected area (yeast) and oral medication for 7 days. Keep area clean with soap and water.  Basalglar started today 15 U daily, if blood sugar still > 180 in 10 days you can increase dose to 20 U. No changes in Victoza.  Please be sure medication list is accurate. If a new problem present, please set up appointment sooner than planned today.   Ferri's Clinical Advisor 2018 (pp. [id number]). [place], [place]: Elsevier.">  Balanitis  Balanitis is swelling and irritation of the head of the penis (glans penis). Balanitis occurs most often among males who have not had their foreskin removed (uncircumcised). In uncircumcised males, the condition may also cause inflammation of the skin around the foreskin. Balanitis sometimes causes scarring of the penis or foreskin, which can require surgery. This condition may develop because of an infection or another medical condition. Untreated balanitis can increase the risk of penile cancer. What are the causes? Common causes of this condition include:  Poor personal hygiene, especially in uncircumcised males. Not cleaning the glans penis and foreskin well can result in a buildup of bacteria, viruses, and yeast, which can lead to infection and inflammation.  Irritation and lack of air flow due to fluid (smegma) that can build up on the glans penis. Other causes include:  Chemical irritation from products such as soaps or shower gels, especially those that have fragrance. Chemical irritation can also be caused by condoms, personal lubricants, petroleum jelly, spermicides, or fabric softeners.  Skin conditions, such as eczema, dermatitis,  and psoriasis.  Allergies to medicines, such as tetracycline and sulfa drugs. What increases the risk? The following factors may make you more likely to develop this condition:  Being an uncircumcised male.  Having diabetes.  Having other medical conditions, including liver cirrhosis, congestive heart failure, or kidney disease.  Having infections, such as candidiasis, HPV (human papillomavirus), herpes simplex, gonorrhea, or syphilis.  Having a tight foreskin that is difficult to pull back (retract) past the glans penis.  Being severely obese. What are the signs or symptoms? Symptoms of this condition include:  Discharge from under the foreskin, and pain or difficulty retracting the foreskin.  A bad smell or itchiness on the penis.  Tenderness, redness, and swelling of the glans penis.  A rash or sores on the glans penis or foreskin.  Inability to get an erection due to pain.  Difficulty urinating.  Scarring of the penis or foreskin, in some cases. How is this diagnosed? This condition may be diagnosed based on a physical exam and tests of a swab of discharge to check for bacterial or fungal infection. You may also have blood tests to check for:  Viruses that can cause balanitis.  A high blood sugar (glucose) level. This could be a sign of diabetes, which can increase the risk of balanitis. How is this treated? Treatment for balanitis depends on the cause. Treatment may include:  Improving personal hygiene. Your health care provider may recommend sitting in a bath of warm water that is deep enough to cover your hips and buttocks (sitz bath).  Taking medicines such as: ? Creams or ointments to reduce swelling (steroids) or to treat an infection. ?  Antibiotic medicine. ? Antifungal medicine.  Having surgery to remove or cut the foreskin (circumcision). This may be done if you have scarring on the foreskin that makes it difficult to retract.  Controlling other  medical problems that may be causing your condition or making it worse. Follow these instructions at home: Medicines  Take over-the-counter and prescription medicines only as told by your health care provider.  If you were prescribed an antibiotic medicine or a cream or ointment, use it as told by your health care provider. Do not stop using your medicine, cream, or ointment even if you start to feel better. General instructions  Do not have sex until the condition clears up, or until your health care provider approves.  Keep your penis clean and dry. Take sitz baths as recommended by your health care provider.  Avoid products that irritate your skin or make symptoms worse, such as soaps and shower gels that have fragrance.  Keep all follow-up visits as told by your health care provider. This is important. Contact a health care provider if:  Your symptoms get worse or do not improve with home care.  You develop chills or a fever.  You have trouble urinating.  You cannot retract your foreskin. Get help right away if:  You develop severe pain.  You are unable to urinate. Summary  Balanitis is inflammation of the head of the penis (glans penis) caused by irritation or infection. This condition is most common among uncircumcised males.  Balanitis causes pain, redness, and swelling of the glans penis.  Good personal hygiene is important.  Treatment may include improving personal hygiene and applying creams or ointments.  Contact a health care provider if your symptoms get worse or do not improve with home care. This information is not intended to replace advice given to you by your health care provider. Make sure you discuss any questions you have with your health care provider. Document Revised: 05/16/2019 Document Reviewed: 05/16/2019 Elsevier Patient Education  [place].

## 2020-10-08 NOTE — Telephone Encounter (Signed)
PA has been completed, waiting on response from insurance.  Miguel Sanders (Key: [id number]) [phone]

## 2020-10-08 NOTE — Telephone Encounter (Signed)
Rx's resent.  

## 2020-10-08 NOTE — Assessment & Plan Note (Addendum)
He understands benefits of wt loss as well as adverse effects of obesity. Some wt loss since his last visit, which could be related with poorly controlled diabetes. Consistency with healthy diet and physical activity recommended.

## 2020-10-08 NOTE — Telephone Encounter (Signed)
Patient is calling and stated that the pharmacy where provider sent medication system is down and if can be sent to [facility].

## 2020-10-08 NOTE — Assessment & Plan Note (Addendum)
Problem is not well controlled, HgA1C went from 7.9 to 10.3. Recommend continuing Victoza 1.8 mg daily, he is not sure about insurance coverage, he will let me know. We discussed other pharmacologic options. Basaglar added today, samples given, instructed to start with 15 units daily. He can increase Basaglar dose to 20 units daily if BS > 180's in 10 days. We discussed some side effects of medication, including the risk of hypoglycemic events. Continue monitoring BS. Regular exercise and healthy diet with avoidance of added sugar food intake is an important part of treatment and recommended. Reviewed possible complications of elevated BS's. Annual eye exam is overdue. Periodic dental and foot care also recommended. F/U in 3-4 months

## 2020-10-13 ENCOUNTER — Encounter: Payer: Self-pay | Admitting: Family Medicine

## 2020-10-13 NOTE — Progress Notes (Signed)
Request Reference Number: [id number]. BASAGLAR INJ [id number] is approved through 10/09/2022. Please refer to the fax or electronic case notice for further information.

## 2020-10-20 ENCOUNTER — Other Ambulatory Visit: Payer: Self-pay | Admitting: Family Medicine

## 2020-11-04 ENCOUNTER — Other Ambulatory Visit: Payer: Self-pay | Admitting: Family Medicine

## 2020-11-11 ENCOUNTER — Other Ambulatory Visit: Payer: Self-pay | Admitting: Family Medicine

## 2020-11-28 ENCOUNTER — Encounter: Payer: Self-pay | Admitting: Family Medicine

## 2020-11-28 DIAGNOSIS — E785 Hyperlipidemia, unspecified: Secondary | ICD-10-CM

## 2020-11-28 DIAGNOSIS — E1169 Type 2 diabetes mellitus with other specified complication: Secondary | ICD-10-CM

## 2020-11-28 DIAGNOSIS — Z8739 Personal history of other diseases of the musculoskeletal system and connective tissue: Secondary | ICD-10-CM

## 2020-11-28 MED ORDER — PRAVASTATIN SODIUM 40 MG PO TABS: 40.0000 mg | ORAL_TABLET | Freq: Every day | ORAL | 3 refills | Status: DC

## 2020-11-28 MED ORDER — ALLOPURINOL 100 MG PO TABS: ORAL_TABLET | ORAL | 3 refills | Status: DC

## 2020-12-16 ENCOUNTER — Telehealth: Payer: BC Managed Care – PPO | Admitting: Family Medicine

## 2020-12-16 ENCOUNTER — Encounter: Payer: Self-pay | Admitting: Family Medicine

## 2020-12-16 DIAGNOSIS — R197 Diarrhea, unspecified: Secondary | ICD-10-CM | POA: Diagnosis not present

## 2020-12-16 NOTE — Progress Notes (Signed)
Virtual Visit via Video Note  I connected with Miguel Sanders  on 12/16/20 at 10:20 AM EDT by a video enabled telemedicine application and verified that I am speaking with the correct person using two identifiers.  Location patient: home, [place] Location provider:work or home office Persons participating in the virtual visit: patient, provider  I discussed the limitations of evaluation and management by telemedicine and the availability of in person appointments. The patient expressed understanding and agreed to proceed.   HPI:  Acute telemedicine visit for flu like symptoms: -Onset:about 4-5 days ago -Symptoms include: fever initially, chills, diarrhea initially -covid test was negative today -feeling much better today with resolution of most symptoms -Denies: CP, SOB, vomiting, resp symptoms, abd pain, melna, hematochezia -no known sick contacts -no recent bad food, travel, abx -Has tried: -Pertinent past medical history: see below, DM - blood sugars have been ok - BS 138 today -Pertinent medication allergies:No Known Allergies  -COVID-19 vaccine status: not vaccinated  ROS: See pertinent positives and negatives per HPI.  Past Medical History:  Diagnosis Date  . Diabetes mellitus without complication ([place])   . Gout   . Hyperlipidemia   . Obesity, Class III, BMI 40-49.9 (morbid obesity) ([place])     Past Surgical History:  Procedure Laterality Date  . BACK SURGERY    . SPINE SURGERY       Current Outpatient Medications:  .  allopurinol (ZYLOPRIM) 100 MG tablet, TAKE 1 TABLET(100 MG) BY MOUTH DAILY, Disp: 90 tablet, Rfl: 3 .  Insulin Glargine (BASAGLAR KWIKPEN) 100 UNIT/ML, Inject 15 Units into the skin at bedtime., Disp: 9 mL, Rfl: 2 .  Insulin Pen Needle (B-D UF III MINI PEN NEEDLES) 31G X 5 MM MISC, USE DAILY AS DIRECTED, Disp: 100 each, Rfl: 3 .  liraglutide (VICTOZA) 18 MG/3ML SOPN, Inject 1.8mg  daily., Disp: 9 mL, Rfl: 3 .  MITIGARE 0.6 MG CAPS, TAKE 1 TABLET BY MOUTH DAILY  AS NEEDED. HOLD ATORVASTAIN WHEN TAKING THIS MEDICATION, Disp: 30 capsule, Rfl: 3 .  omega-3 acid ethyl esters (LOVAZA) 1 g capsule, Take 1 capsule (1 g total) by mouth 2 (two) times daily., Disp: 180 capsule, Rfl: 1 .  pravastatin (PRAVACHOL) 40 MG tablet, Take 1 tablet (40 mg total) by mouth daily., Disp: 90 tablet, Rfl: 3  EXAM:  VITALS per patient if applicable:  GENERAL: alert, oriented, appears well and in no acute distress  HEENT: atraumatic, conjunttiva clear, no obvious abnormalities on inspection of external nose and ears  NECK: normal movements of the head and neck  LUNGS: on inspection no signs of respiratory distress, breathing rate appears normal, no obvious gross SOB, gasping or wheezing  CV: no obvious cyanosis  MS: moves all visible extremities without noticeable abnormality  PSYCH/NEURO: pleasant and cooperative, no obvious depression or anxiety, speech and thought processing grossly intact  ASSESSMENT AND PLAN:  Discussed the following assessment and plan:  Diarrhea, unspecified type  -we discussed possible serious and likely etiologies, options for evaluation and workup, limitations of telemedicine visit vs in person visit, treatment, treatment risks and precautions. Pt prefers to treat via telemedicine empirically rather than in person at this moment. Discussed possible viral gastroenteritis, influenza vs other. He reports negative covid testing. Since improving he opted for imodium if needed, oral hydration, avoiding dairy and work note today.  Work/School slipped offered: provided in patient instructions  Scheduled follow up with PCP offered:follow up as needed.  Advised to seek prompt in person care if worsening, new symptoms arise,  or if is not improving with treatment. Discussed options for inperson care if PCP office not available. Did let this patient know that I only do telemedicine on Tuesdays and Thursdays for [place]. Advised to schedule follow up visit  with PCP or [facility] if any further questions or concerns to avoid delays in care.   I discussed the assessment and treatment plan with the patient. The patient was provided an opportunity to ask questions and all were answered. The patient agreed with the plan and demonstrated an understanding of the instructions.     [name], DO

## 2020-12-16 NOTE — Patient Instructions (Signed)
   ---------------------------------------------------------------------------------------------------------------------------      WORK SLIP:  Patient Miguel Sanders,  23-Jan-1976, was seen for a medical visit today, 12/16/20 . Please excuse from work for illness. If COVID testing is negative and symptoms have resolved or improved defer to employer to return to work 24 hours after fevers/diarrhea/vomiting subside.   Sincerely: E-signature: Dr. Colin Benton, DO [facility] Ph: [phone]   ------------------------------------------------------------------------------------------------------------------------------  I am glad you are feeling better.  Drink plenty of fluids.  Avoid dairy for a few days.  Imodium can help with the diarrhea if needed.   Seek in person care promptly if your symptoms worsen, new concerns arise or you are not improving with treatment.  It was nice to meet you today. I help [facility] out with telemedicine visits on Tuesdays and Thursdays and am available for visits on those days. If you have any concerns or questions following this visit please schedule a follow up visit with your Primary Care doctor or seek care at a local urgent care clinic to avoid delays in care.

## 2021-01-01 ENCOUNTER — Other Ambulatory Visit: Payer: Self-pay | Admitting: Family Medicine

## 2021-01-02 ENCOUNTER — Encounter: Payer: Self-pay | Admitting: Family Medicine

## 2021-01-02 MED ORDER — VICTOZA 18 MG/3ML ~~LOC~~ SOPN: PEN_INJECTOR | SUBCUTANEOUS | 3 refills | Status: DC

## 2021-01-02 NOTE — Addendum Note (Signed)
Addended by: Rodrigo Ran on: 01/02/2021 03:27 PM   Modules accepted: Orders

## 2021-06-02 ENCOUNTER — Ambulatory Visit: Payer: BC Managed Care – PPO | Admitting: Family Medicine

## 2021-06-02 ENCOUNTER — Other Ambulatory Visit: Payer: Self-pay

## 2021-06-02 VITALS — BP 142/82 | HR 91 | Temp 98.1°F | Wt 376.6 lb

## 2021-06-02 DIAGNOSIS — J029 Acute pharyngitis, unspecified: Secondary | ICD-10-CM

## 2021-06-02 LAB — POCT RAPID STREP A (OFFICE): Rapid Strep A Screen: NEGATIVE

## 2021-06-02 MED ORDER — NAPROXEN 500 MG PO TABS: 500.0000 mg | ORAL_TABLET | Freq: Two times a day (BID) | ORAL | 0 refills | Status: DC

## 2021-06-02 NOTE — Progress Notes (Signed)
Established Patient Office Visit  Subjective:  Patient ID: Miguel Sanders, male    DOB: [birth year]  Age: 45 y.o. MRN: [id number]  CC:  Chief Complaint  Patient presents with   Sore Throat    X 1 week, left side of throat, painful to swallow    HPI Miguel Sanders presents for sore throat symptoms which started about a week ago.  Symptoms are left side only.  No fever.  No adenopathy.  He has pain mostly with swallowing.  Symptoms tend to be worse early in the morning.  Denies any real nasal congestion or postnasal drip.  Only minimal cough.  He has tried Tylenol, ibuprofen, and cough drops with minimal relief.  He has had strep previously but has noted things like exudate and fever and adenopathy previously with strep and is not noted any of those with this episode.  No sick contacts.  Past Medical History:  Diagnosis Date   Diabetes mellitus without complication (HCC)    Gout    Hyperlipidemia    Obesity, Class III, BMI 40-49.9 (morbid obesity) ([place])     Past Surgical History:  Procedure Laterality Date   BACK SURGERY     SPINE SURGERY      Family History  Problem Relation Age of Onset   Arthritis Mother    Colon cancer Maternal Grandfather 37   Cancer Other    Hypertension Other     Social History   Socioeconomic History   Marital status: Married    Spouse name: Not on file   Number of children: 2   Years of education: Not on file   Highest education level: Not on file  Occupational History   Occupation: unemployed  Tobacco Use   Smoking status: Former    Packs/day: 1.50    Years: 10.00    Pack years: 15.00    Types: Cigarettes    Quit date: 08/09/2009    Years since quitting: 11.8   Smokeless tobacco: Never  Substance and Sexual Activity   Alcohol use: No   Drug use: No   Sexual activity: Yes  Other Topics Concern   Not on file  Social History Narrative   Marital status: married x 5 years. Separated from wife in 08/2012.     Children: 2  children from previous marriage; gets children every two weeks; sees more frequently.     Lives: alone; separated from wife in 08/2012.     Employment: Glass blower/designer.      Tobacco: quit; smoked x 13 years.      Alcohol: once every three months.      Drugs: none now; marijuana in past.      Exercise: started going to gym in 08/2012; going 4-5 days per week; elliptical and stationary bike.            Social Determinants of Health   Financial Resource Strain: Not on file  Food Insecurity: Not on file  Transportation Needs: Not on file  Physical Activity: Not on file  Stress: Not on file  Social Connections: Not on file  Intimate Partner Violence: Not on file    Outpatient Medications Prior to Visit  Medication Sig Dispense Refill   allopurinol (ZYLOPRIM) 100 MG tablet TAKE 1 TABLET(100 MG) BY MOUTH DAILY 90 tablet 3   Insulin Glargine (BASAGLAR KWIKPEN) 100 UNIT/ML Inject 15 Units into the skin at bedtime. 9 mL 2   Insulin Pen Needle (B-D UF III MINI PEN NEEDLES) 31G X  5 MM MISC USE DAILY AS DIRECTED 100 each 3   liraglutide (VICTOZA) 18 MG/3ML SOPN ADMINISTER 1.8 MG UNDER THE SKIN DAILY 18 mL 3   MITIGARE 0.6 MG CAPS TAKE 1 TABLET BY MOUTH DAILY AS NEEDED. HOLD ATORVASTAIN WHEN TAKING THIS MEDICATION 30 capsule 3   omega-3 acid ethyl esters (LOVAZA) 1 g capsule Take 1 capsule (1 g total) by mouth 2 (two) times daily. 180 capsule 1   pravastatin (PRAVACHOL) 40 MG tablet Take 1 tablet (40 mg total) by mouth daily. 90 tablet 3   No facility-administered medications prior to visit.    No Known Allergies  ROS Review of Systems  Constitutional:  Negative for chills and fever.  HENT:  Positive for sore throat. Negative for sinus pressure and sinus pain.   Respiratory:  Negative for shortness of breath.   Hematological:  Negative for adenopathy.     Objective:    Physical Exam Vitals reviewed.  Constitutional:      Appearance: He is well-developed.  HENT:     Right Ear:  Tympanic membrane normal.     Left Ear: Tympanic membrane normal.     Mouth/Throat:     Comments: Tonsillar tissue is pink bilaterally.  No significant erythema.  No exudate.  No soft palate or tonsillar asymmetry. Cardiovascular:     Rate and Rhythm: Normal rate and regular rhythm.  Pulmonary:     Effort: Pulmonary effort is normal.     Breath sounds: Normal breath sounds.  Musculoskeletal:     Cervical back: Neck supple.  Lymphadenopathy:     Cervical: No cervical adenopathy.  Neurological:     Mental Status: He is alert.    BP (!) 142/82 (BP Location: Left Arm, Patient Position: Sitting, Cuff Size: Normal)   Pulse 91   Temp 98.1 F (36.7 C) (Oral)   Wt (!) 376 lb 9.6 oz (170.8 kg)   SpO2 97%   BMI 49.69 kg/m  Wt Readings from Last 3 Encounters:  06/02/21 (!) 376 lb 9.6 oz (170.8 kg)  10/08/20 (!) 382 lb (173.3 kg)  07/31/20 (!) 390 lb 8 oz (177.1 kg)     Health Maintenance Due  Topic Date Due   COVID-19 Vaccine (1) Never done   Pneumococcal Vaccine 42-48 Years old (1 - PCV) Never done   OPHTHALMOLOGY EXAM  Never done   FOOT EXAM  11/11/2020   INFLUENZA VACCINE  Never done   HEMOGLOBIN A1C  04/10/2021   URINE MICROALBUMIN  05/14/2021    There are no preventive care reminders to display for this patient.  Lab Results  Component Value Date   TSH 1.05 06/01/2017   Lab Results  Component Value Date   WBC 6.0 04/28/2020   HGB 15.6 04/28/2020   HCT 45.9 04/28/2020   MCV 90.5 04/28/2020   PLT 201 04/28/2020   Lab Results  Component Value Date   NA 137 05/14/2020   K 4.2 05/14/2020   CO2 24 05/14/2020   GLUCOSE 233 (H) 05/14/2020   BUN 13 05/14/2020   CREATININE 0.93 05/14/2020   BILITOT 0.3 05/14/2020   ALKPHOS 85 11/12/2019   AST 23 05/14/2020   ALT 38 05/14/2020   PROT 7.0 05/14/2020   ALBUMIN 4.0 11/12/2019   CALCIUM 9.5 05/14/2020   GFR 89.95 11/12/2019   Lab Results  Component Value Date   CHOL 169 05/14/2020   Lab Results  Component  Value Date   HDL 27 (L) 05/14/2020   Lab Results  Component Value Date   [facility]  05/14/2020     Comment:     . LDL cholesterol not calculated. Triglyceride levels greater than 400 mg/dL invalidate calculated LDL results. . Reference range: <100 . Desirable range <100 mg/dL for primary prevention;   <70 mg/dL for patients with CHD or diabetic patients  with > or = 2 CHD risk factors. Marland Kitchen LDL-C is now calculated using the Martin-Hopkins  calculation, which is a validated novel method providing  better accuracy than the Friedewald equation in the  estimation of LDL-C.  Cresenciano Genre et al. Annamaria Helling. 9201;007(12): 2061-2068  (http://education.[facility].com/faq/FAQ164)    Lab Results  Component Value Date   TRIG 598 (H) 05/14/2020   Lab Results  Component Value Date   CHOLHDL 6.3 (H) 05/14/2020   Lab Results  Component Value Date   HGBA1C 10.3 (A) 10/08/2020      Assessment & Plan:   Problem List Items Addressed This Visit   None Visit Diagnoses     Sore throat    -  Primary   Relevant Orders   POC Rapid Strep A     Clinically, this does not look like strep but he does not have any significant cough or nasal congestion.  No evidence on exam to suggest peritonsillar abscess or peritonsillar cellulitis. -Obtain rapid strep to be sure -If negative treat symptomatically -If not improving by next week consider ENT referral  Rapid strep negative.  Will treat symptomatically with naproxen 500 mg every 12 hours as needed with food and be in touch if symptoms not resolving over the next week  No orders of the defined types were placed in this encounter.   Follow-up: No follow-ups on file.    Carolann Littler, MD

## 2021-06-19 ENCOUNTER — Ambulatory Visit: Payer: BC Managed Care – PPO | Admitting: Family Medicine

## 2021-06-19 ENCOUNTER — Encounter: Payer: Self-pay | Admitting: Family Medicine

## 2021-06-19 VITALS — BP 124/86 | HR 75 | Temp 98.2°F | Wt 377.6 lb

## 2021-06-19 DIAGNOSIS — M545 Low back pain, unspecified: Secondary | ICD-10-CM | POA: Diagnosis not present

## 2021-06-19 DIAGNOSIS — S39012A Strain of muscle, fascia and tendon of lower back, initial encounter: Secondary | ICD-10-CM

## 2021-06-19 NOTE — Progress Notes (Signed)
Subjective:    Patient ID: Miguel Sanders, male    DOB: 27-Sep-1975, 45 y.o.   MRN: [id number]  Chief Complaint  Patient presents with   Back Pain    Hurt back on Wednesday, lower right side. Got up from sitting and stepped to the ladder and the pain started. Took ibuprofen, feels a little better today     HPI Patient was seen today for acute concern.  Patient endorses injuring low back at work on Wednesday.  Patient states he was sitting on a bucket stool then stood up and put 1 foot on a ladder when he felt low right back pain.  Patient states the pain took his breath away.  Patient started taking ibuprofen 800 mg Q 4-6 hours, using ice and heat for his symptoms.  Symptoms have improved significantly this morning.  Patient endorses a tightness in the back not really a pain.  Pt does not have a Workmen's Comp. claim for above injury.  Past Medical History:  Diagnosis Date   Diabetes mellitus without complication (HCC)    Gout    Hyperlipidemia    Obesity, Class III, BMI 40-49.9 (morbid obesity) ([place])     No Known Allergies  ROS General: Denies fever, chills, night sweats, changes in weight, changes in appetite HEENT: Denies headaches, ear pain, changes in vision, rhinorrhea, sore throat CV: Denies CP, palpitations, SOB, orthopnea Pulm: Denies SOB, cough, wheezing GI: Denies abdominal pain, nausea, vomiting, diarrhea, constipation GU: Denies dysuria, hematuria, frequency Msk: Denies muscle cramps, joint pains  + low back pain Neuro: Denies weakness, numbness, tingling Skin: Denies rashes, bruising Psych: Denies depression, anxiety, hallucinations    Objective:    Blood pressure 124/86, pulse 75, temperature 98.2 F (36.8 C), temperature source Oral, weight (!) 377 lb 9.6 oz (171.3 kg), SpO2 92 %.  Gen. Pleasant, well-nourished, in no distress, normal affect   HEENT: [place]/AT, face symmetric, conjunctiva clear, no scleral icterus, PERRLA, EOMI, nares patent without  drainage Lungs: no accessory muscle use Cardiovascular: RRR,  no peripheral edema Musculoskeletal: No TTP of cervical, thoracic, or lumbar spine.  TTP and tightness of right lumbar paraspinal muscles.  No deformities, no cyanosis or clubbing, normal tone Neuro:  A&Ox3, CN II-XII intact, normal gait.  Bilateral LE strength 5/5. Skin:  Warm, no lesions/ rash   Wt Readings from Last 3 Encounters:  06/19/21 (!) 377 lb 9.6 oz (171.3 kg)  06/02/21 (!) 376 lb 9.6 oz (170.8 kg)  10/08/20 (!) 382 lb (173.3 kg)    Lab Results  Component Value Date   WBC 6.0 04/28/2020   HGB 15.6 04/28/2020   HCT 45.9 04/28/2020   PLT 201 04/28/2020   GLUCOSE 233 (H) 05/14/2020   CHOL 169 05/14/2020   TRIG 598 (H) 05/14/2020   HDL 27 (L) 05/14/2020   LDLDIRECT 91.0 11/12/2019   [place]  05/14/2020     Comment:     . LDL cholesterol not calculated. Triglyceride levels greater than 400 mg/dL invalidate calculated LDL results. . Reference range: <100 . Desirable range <100 mg/dL for primary prevention;   <70 mg/dL for patients with CHD or diabetic patients  with > or = 2 CHD risk factors. Marland Kitchen LDL-C is now calculated using the Martin-Hopkins  calculation, which is a validated novel method providing  better accuracy than the Friedewald equation in the  estimation of LDL-C.  Cresenciano Genre et al. Annamaria Helling. 4008;676(19): 2061-2068  (http://education.[facility].com/faq/FAQ164)    ALT 38 05/14/2020   AST 23 05/14/2020  NA 137 05/14/2020   K 4.2 05/14/2020   CL 101 05/14/2020   CREATININE 0.93 05/14/2020   BUN 13 05/14/2020   CO2 24 05/14/2020   TSH 1.05 06/01/2017   HGBA1C 10.3 (A) 10/08/2020   MICROALBUR 29.5 05/14/2020    Assessment/Plan:  Acute right-sided low back pain without sciatica  Strain of lumbar region, initial encounter  Morbid obesity ([place])  -Patient encouraged to report injury at work in the event of future issues.  Discussed continuing supportive care including heat,  stretching, massage, NSAIDs or Tylenol as needed.  Appropriate dosing/timing for NSAIDs.  Patient to use OTC ibuprofen.  Has at home.  Advised likely to take several weeks for full improvement.  Weight loss encouraged.  Given handouts.  Given strict precautions for worsening symptoms.  F/u as needed with PCP  Grier Mitts, MD

## 2021-06-19 NOTE — Patient Instructions (Signed)
Continued using heat and taking ibuprofen as needed for your low back pain.  It is likely to take several weeks for symptoms to fully improve.

## 2021-07-29 LAB — HM DIABETES EYE EXAM

## 2021-09-18 ENCOUNTER — Other Ambulatory Visit: Payer: Self-pay | Admitting: Family Medicine

## 2021-11-10 ENCOUNTER — Other Ambulatory Visit: Payer: Self-pay | Admitting: Family Medicine

## 2021-11-24 ENCOUNTER — Other Ambulatory Visit: Payer: Self-pay | Admitting: Family Medicine

## 2021-12-14 ENCOUNTER — Encounter: Payer: Self-pay | Admitting: Family Medicine

## 2021-12-14 ENCOUNTER — Ambulatory Visit: Payer: BC Managed Care – PPO | Admitting: Family Medicine

## 2021-12-14 VITALS — BP 128/80 | HR 75 | Temp 98.8°F | Resp 16 | Ht 73.0 in | Wt 366.1 lb

## 2021-12-14 DIAGNOSIS — R110 Nausea: Secondary | ICD-10-CM | POA: Diagnosis not present

## 2021-12-14 DIAGNOSIS — E1149 Type 2 diabetes mellitus with other diabetic neurological complication: Secondary | ICD-10-CM

## 2021-12-14 LAB — COMPREHENSIVE METABOLIC PANEL
ALT: 38 U/L (ref 0–53)
AST: 27 U/L (ref 0–37)
Albumin: 4.1 g/dL (ref 3.5–5.2)
Alkaline Phosphatase: 78 U/L (ref 39–117)
BUN: 10 mg/dL (ref 6–23)
CO2: 25 mEq/L (ref 19–32)
Calcium: 9.7 mg/dL (ref 8.4–10.5)
Chloride: 104 mEq/L (ref 96–112)
Creatinine, Ser: 0.86 mg/dL (ref 0.40–1.50)
GFR: 105.16 mL/min (ref >60.00)
Glucose, Bld: 134 mg/dL — ABNORMAL HIGH (ref 70–99)
Potassium: 4.4 mEq/L (ref 3.5–5.1)
Sodium: 138 mEq/L (ref 135–145)
Total Bilirubin: 0.4 mg/dL (ref 0.2–1.2)
Total Protein: 7.8 g/dL (ref 6.0–8.3)

## 2021-12-14 LAB — HEMOGLOBIN A1C: Hgb A1c MFr Bld: 9 % — ABNORMAL HIGH (ref 4.6–6.5)

## 2021-12-14 LAB — CBC WITH DIFFERENTIAL/PLATELET
Basophils Absolute: 0 10*3/uL (ref 0.0–0.1)
Basophils Relative: 0.7 % (ref 0.0–3.0)
Eosinophils Absolute: 0.2 10*3/uL (ref 0.0–0.7)
Eosinophils Relative: 3.8 % (ref 0.0–5.0)
HCT: 43.9 % (ref 39.0–52.0)
Hemoglobin: 14.9 g/dL (ref 13.0–17.0)
Lymphocytes Relative: 26.5 % (ref 12.0–46.0)
Lymphs Abs: 1.4 10*3/uL (ref 0.7–4.0)
MCHC: 33.9 g/dL (ref 30.0–36.0)
MCV: 88.9 fl (ref 78.0–100.0)
Monocytes Absolute: 0.3 10*3/uL (ref 0.1–1.0)
Monocytes Relative: 5.7 % (ref 3.0–12.0)
Neutro Abs: 3.3 10*3/uL (ref 1.4–7.7)
Neutrophils Relative %: 63.3 % (ref 43.0–77.0)
Platelets: 161 10*3/uL (ref 150.0–400.0)
RBC: 4.94 Mil/uL (ref 4.22–5.81)
RDW: 13.1 % (ref 11.5–15.5)
WBC: 5.2 10*3/uL (ref 4.0–10.5)

## 2021-12-14 MED ORDER — ONDANSETRON HCL 4 MG PO TABS: 4.0000 mg | ORAL_TABLET | Freq: Two times a day (BID) | ORAL | 0 refills | Status: AC | PRN

## 2021-12-14 NOTE — Patient Instructions (Addendum)
A few things to remember from today's visit: ? ?Type II diabetes mellitus with neurological manifestations ([place]) - Plan: Comprehensive metabolic panel, Hemoglobin A1c ? ?Nausea without vomiting - Plan: Comprehensive metabolic panel, CBC with Differential/Platelet, ondansetron (ZOFRAN) 4 MG tablet ? ?If you need refills please call your pharmacy. ?Do not use My Chart to request refills or for acute issues that need immediate attention. ?  ?Please be sure medication list is accurate. ?If a new problem present, please set up appointment sooner than planned today. ? ?Monitor for new symptoms. ?Monitor blood sugars. ?Adequate hydration. ?Zofran every 8 hours for nausea. ?Adequate hydration. ? ?

## 2021-12-14 NOTE — Progress Notes (Signed)
?ACUTE VISIT ?Chief Complaint  ?Patient presents with  ? Nausea  ?  & dry heaves, started this morning. No diarrhea, has not vomited anything other than some spit and water.   ? ?HPI: ?[name] is a 46 y.o. male, who is here today complaining of nausea as described above. ? ?Problem started today around 8-8:30 am. ?He has had some fatigue. ?No associated fever, chills, abnormal weight loss, abdominal pain, changes in bowel habits, vomiting, or urinary symptoms. ?Negative for heartburn. ?He has abdominal wall soreness when having dry heaves. ? ?OTC medications. ?Last bowel movement today, no changes. ?Negative for sick contacts or recent travel. ? ?DM 2:Dx'ed in 2012. ?He has not been taking Basaglar or Victoza. ?Negative for polydipsia,polyuria, or polyphagia. ? ?Lab Results  ?Component Value Date  ? HGBA1C 10.3 (A) 10/08/2020  ?He is not checking BP's. ? ?Review of Systems  ?Constitutional:  Negative for activity change and appetite change.  ?HENT:  Negative for mouth sores, nosebleeds and sore throat.   ?Eyes:  Negative for redness and visual disturbance.  ?Respiratory:  Negative for cough, shortness of breath and wheezing.   ?Cardiovascular:  Negative for chest pain, palpitations and leg swelling.  ?Genitourinary:  Negative for decreased urine volume, dysuria and hematuria.  ?Neurological:  Negative for syncope, weakness, numbness and headaches.  ?Rest see pertinent positives and negatives per HPI. ? ?Current Outpatient Medications on File Prior to Visit  ?Medication Sig Dispense Refill  ? allopurinol (ZYLOPRIM) 100 MG tablet TAKE 1 TABLET(100 MG) BY MOUTH DAILY 90 tablet 3  ? Insulin Glargine (BASAGLAR KWIKPEN) 100 UNIT/ML ADMINISTER 15 UNITS UNDER THE SKIN AT BEDTIME 15 mL 0  ? Insulin Pen Needle (B-D UF III MINI PEN NEEDLES) 31G X 5 MM MISC USE AS DIRECTED 100 each 0  ? liraglutide (VICTOZA) 18 MG/3ML SOPN ADMINISTER 1.8 MG UNDER THE SKIN DAILY 18 mL 3  ? MITIGARE 0.6 MG CAPS TAKE 1 TABLET BY  MOUTH DAILY AS NEEDED. HOLD ATORVASTAIN WHEN TAKING THIS MEDICATION 30 capsule 3  ? pravastatin (PRAVACHOL) 40 MG tablet Take 1 tablet (40 mg total) by mouth daily. 90 tablet 3  ? ?No current facility-administered medications on file prior to visit.  ? ?Past Medical History:  ?Diagnosis Date  ? Diabetes mellitus without complication ([place])   ? Gout   ? Hyperlipidemia   ? Obesity, Class III, BMI 40-49.9 (morbid obesity) ([place])   ? ?No Known Allergies ? ?Social History  ? ?Socioeconomic History  ? Marital status: Married  ?  Spouse name: Not on file  ? Number of children: 2  ? Years of education: Not on file  ? Highest education level: Not on file  ?Occupational History  ? Occupation: unemployed  ?Tobacco Use  ? Smoking status: Former  ?  Packs/day: 1.50  ?  Years: 10.00  ?  Pack years: 15.00  ?  Types: Cigarettes  ?  Quit date: 08/09/2009  ?  Years since quitting: 12.3  ? Smokeless tobacco: Never  ?Substance and Sexual Activity  ? Alcohol use: No  ? Drug use: No  ? Sexual activity: Yes  ?Other Topics Concern  ? Not on file  ?Social History Narrative  ? Marital status: married x 5 years. Separated from wife in 08/2012.  ?   Children: 2 children from previous marriage; gets children every two weeks; sees more frequently.  ?   Lives: alone; separated from wife in 08/2012.  ?   Employment: Glass blower/designer.  ?  Tobacco: quit; smoked x 13 years.  ?    Alcohol: once every three months.  ?    Drugs: none now; marijuana in past.  ?    Exercise: started going to gym in 08/2012; going 4-5 days per week; elliptical and stationary bike.  ?   ?   ?   ? ?Social Determinants of Health  ? ?Financial Resource Strain: Not on file  ?Food Insecurity: Not on file  ?Transportation Needs: Not on file  ?Physical Activity: Not on file  ?Stress: Not on file  ?Social Connections: Not on file  ? ?Vitals:  ? 12/14/21 1019  ?BP: 128/80  ?Pulse: 75  ?Resp: 16  ?Temp: 98.8 ?F (37.1 ?C)  ?SpO2: 97%  ? ?Wt Readings from Last 3 Encounters:  ?12/14/21 (!)  366 lb 2 oz (166.1 kg)  ?06/19/21 (!) 377 lb 9.6 oz (171.3 kg)  ?06/02/21 (!) 376 lb 9.6 oz (170.8 kg)  ? ?Body mass index is 48.3 kg/m?. ? ?Physical Exam ?Nursing note reviewed.  ?Constitutional:   ?   General: He is not in acute distress. ?   Appearance: He is well-developed.  ?HENT:  ?   Head: Normocephalic and atraumatic.  ?Eyes:  ?   Conjunctiva/sclera: Conjunctivae normal.  ?Cardiovascular:  ?   Rate and Rhythm: Normal rate and regular rhythm.  ?   Pulses:     ?     Dorsalis pedis pulses are 2+ on the right side and 2+ on the left side.  ?   Heart sounds: No murmur heard. ?Pulmonary:  ?   Effort: Pulmonary effort is normal. No respiratory distress.  ?   Breath sounds: Normal breath sounds.  ?Abdominal:  ?   Palpations: Abdomen is soft. There is no hepatomegaly or mass.  ?   Tenderness: There is no abdominal tenderness.  ?Lymphadenopathy:  ?   Cervical: No cervical adenopathy.  ?Skin: ?   General: Skin is warm.  ?   Findings: No erythema or rash.  ?Neurological:  ?   Mental Status: He is alert and oriented to person, place, and time.  ?   Cranial Nerves: No cranial nerve deficit.  ?   Gait: Gait normal.  ?Psychiatric:  ?   Comments: Well groomed, good eye contact.  ? ?ASSESSMENT AND PLAN: ? ?[name] was seen today for nausea. ? ?Diagnoses and all orders for this visit: ?Orders Placed This Encounter  ?Procedures  ? Comprehensive metabolic panel  ? Hemoglobin A1c  ? CBC with Differential/Platelet  ? ?Lab Results  ?Component Value Date  ? HGBA1C 9.0 (H) 12/14/2021  ? ?Lab Results  ?Component Value Date  ? CREATININE 0.86 12/14/2021  ? BUN 10 12/14/2021  ? NA 138 12/14/2021  ? K 4.4 12/14/2021  ? CL 104 12/14/2021  ? CO2 25 12/14/2021  ? ?Lab Results  ?Component Value Date  ? ALT 38 12/14/2021  ? AST 27 12/14/2021  ? ALKPHOS 78 12/14/2021  ? BILITOT 0.4 12/14/2021  ? ?Lab Results  ?Component Value Date  ? WBC 5.2 12/14/2021  ? HGB 14.9 12/14/2021  ? HCT 43.9 12/14/2021  ? MCV 88.9 12/14/2021  ? PLT 161.0  12/14/2021  ? ?Nausea without vomiting ?We discussed possible etiologies. ?It could be the beginning of a viral illness or caused by some of his chronic health problems. ?For now I recommend symptomatic treatment with Zofran. ?Bland and bland diet, mainly fluids today. ?Monitor for new symptoms. ?We will obtain some labs today and further recommendation will  be given according with results. ?Instructed about warning signs. ? ?-     ondansetron (ZOFRAN) 4 MG tablet; Take 1 tablet (4 mg total) by mouth every 12 (twelve) hours as needed for up to 5 days for nausea or vomiting. ? ?Type II diabetes mellitus with neurological manifestations ([place]) ?Problem has that been well controlled. ?We discussed possible complications of poorly controlled glucose. ?We will give further recommendations in regard to resuming hypoglycemic agents according to hemoglobin A1c. ? ?Return in about 6 months (around 06/16/2022), or if symptoms worsen or fail to improve. ? ?Jadesola Poynter G. Martinique, MD ? ?[place]. ?[place] office. ? ?

## 2021-12-16 ENCOUNTER — Encounter: Payer: Self-pay | Admitting: Family Medicine

## 2021-12-16 MED ORDER — VICTOZA 18 MG/3ML ~~LOC~~ SOPN: PEN_INJECTOR | SUBCUTANEOUS | 2 refills | Status: DC

## 2021-12-16 MED ORDER — BASAGLAR KWIKPEN 100 UNIT/ML ~~LOC~~ SOPN: 18.0000 [IU] | PEN_INJECTOR | Freq: Every day | SUBCUTANEOUS | 1 refills | Status: DC

## 2021-12-20 ENCOUNTER — Other Ambulatory Visit: Payer: Self-pay | Admitting: Family Medicine

## 2021-12-20 DIAGNOSIS — Z8739 Personal history of other diseases of the musculoskeletal system and connective tissue: Secondary | ICD-10-CM

## 2021-12-20 DIAGNOSIS — E1169 Type 2 diabetes mellitus with other specified complication: Secondary | ICD-10-CM

## 2021-12-21 ENCOUNTER — Telehealth (INDEPENDENT_AMBULATORY_CARE_PROVIDER_SITE_OTHER): Payer: BC Managed Care – PPO | Admitting: Family Medicine

## 2021-12-21 ENCOUNTER — Encounter: Payer: Self-pay | Admitting: Family Medicine

## 2021-12-21 VITALS — Ht 73.0 in

## 2021-12-21 DIAGNOSIS — H1013 Acute atopic conjunctivitis, bilateral: Secondary | ICD-10-CM | POA: Diagnosis not present

## 2021-12-21 MED ORDER — PAZEO 0.7 % OP SOLN: 1.0000 [drp] | Freq: Every day | OPHTHALMIC | 2 refills | Status: DC

## 2021-12-21 NOTE — Progress Notes (Signed)
Virtual Visit via Video Note ?I connected with Miguel Sanders on 12/21/21 by a video enabled telemedicine application and verified that I am speaking with the correct person using two identifiers. ? Location patient: home ?Location provider:work office ?Persons participating in the virtual visit: patient, provider ? ?I discussed the limitations of evaluation and management by telemedicine and the availability of in person appointments. The patient expressed understanding and agreed to proceed. ? ?Chief Complaint  ?Patient presents with  ? eye irritation  ? ?HPI: ?Miguel Sanders is a 46 yo male with hx of DM 2, hyperlipidemia, OSA, and gout with above complaint. ?He was last seen on 12/14/2021, when he was complaining about nausea, it has resolved. ? ?Bilateral eye drainage, left is worse than right. 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 No hx of trauma or chemical exposure. ? ?Drainage is clear, noted in the morning when he wakes up, has improved. ? ?Left lower eyelid tarsal conjunctiva tender with palpation. ?Negative for peri ocular edema or erythema or changes in vision. ?No associated pruritus. ? ?Symptoms are worse in the morning. ?Started Zyrtec 10 mg daily yesterday. ? ?Negative for sick contact or recent URI. ? ?Negative for associated fever,chills,headache,changes in appetite,nasal congestion,rhinorrhea,sore throat,cough,wheezing,SOB,N/V,changes in bowel habits,body aches,or skin rash. ? ?ROS: See pertinent positives and negatives per HPI. ? ?Past Medical History:  ?Diagnosis Date  ? Diabetes mellitus without complication ([place])   ? Gout   ? Hyperlipidemia   ? Obesity, Class III, BMI 40-49.9 (morbid obesity) ([place])   ? ?Past Surgical History:  ?Procedure Laterality Date  ? BACK SURGERY    ? SPINE SURGERY    ? ? ?Family History  ?Problem Relation Age of Onset  ? Arthritis Mother   ? Colon cancer Maternal Grandfather 34  ? Cancer Other   ? Hypertension  Other   ? ? ?Social History  ? ?Socioeconomic History  ? Marital status: Married  ?  Spouse name: Not on file  ? Number of children: 2  ? Years of education: Not on file  ? Highest education level: Not on file  ?Occupational History  ? Occupation: unemployed  ?Tobacco Use  ? Smoking status: Former  ?  Packs/day: 1.50  ?  Years: 10.00  ?  Pack years: 15.00  ?  Types: Cigarettes  ?  Quit date: 08/09/2009  ?  Years since quitting: 12.3  ? Smokeless tobacco: Never  ?Substance and Sexual Activity  ? Alcohol use: No  ? Drug use: No  ? Sexual activity: Yes  ?Other Topics Concern  ? Not on file  ?Social History Narrative  ? Marital status: married x 5 years. Separated from wife in 08/2012.  ?   Children: 2 children from previous marriage; gets children every two weeks; sees more frequently.  ?   Lives: alone; separated from wife in 08/2012.  ?   Employment: Glass blower/designer.  ?    Tobacco: quit; smoked x 13 years.  ?    Alcohol: once every three months.  ?    Drugs: none now; marijuana in past.  ?    Exercise: started going to gym in 08/2012; going 4-5 days per week; elliptical and stationary bike.  ?   ?   ?   ? ?Social Determinants of Health  ? ?Financial Resource Strain: Not on file  ?Food Insecurity: Not on file  ?Transportation Needs: Not on file  ?Physical Activity: Not on file  ?Stress:  Not on file  ?Social Connections: Not on file  ?Intimate Partner Violence: Not on file  ? ?Current Outpatient Medications:  ?  allopurinol (ZYLOPRIM) 100 MG tablet, TAKE 1 TABLET(100 MG) BY MOUTH DAILY, Disp: 90 tablet, Rfl: 1 ?  Insulin Glargine (BASAGLAR KWIKPEN) 100 UNIT/ML, Inject 18 Units into the skin at bedtime., Disp: 15 mL, Rfl: 1 ?  Insulin Pen Needle (B-D UF III MINI PEN NEEDLES) 31G X 5 MM MISC, USE AS DIRECTED, Disp: 100 each, Rfl: 0 ?  liraglutide (VICTOZA) 18 MG/3ML SOPN, ADMINISTER 1.8 MG UNDER THE SKIN DAILY, Disp: 18 mL, Rfl: 2 ?  MITIGARE 0.6 MG CAPS, TAKE 1 TABLET BY MOUTH DAILY AS NEEDED. HOLD ATORVASTAIN WHEN TAKING  THIS MEDICATION, Disp: 30 capsule, Rfl: 3 ?  pravastatin (PRAVACHOL) 40 MG tablet, TAKE 1 TABLET(40 MG) BY MOUTH DAILY, Disp: 90 tablet, Rfl: 1 ? ?EXAM: ? ?VITALS per patient if applicable:Ht '6\' 1"'$  (1.854 m)   BMI 48.30 kg/m?  ? ?GENERAL: alert, oriented, appears well and in no acute distress ? ?HEENT: atraumatic, no obvious abnormalities on inspection of external nose and ears. ?Mild lower tarsal conjunctival erythema, L>R, rest of conjunctiva clear. No drainage appreciated. ?EOM's seem intact. ? ?LUNGS: on inspection no signs of respiratory distress, breathing rate appears normal, no obvious gross SOB, gasping or wheezing ? ?CV: no obvious cyanosis ? ?PSYCH/NEURO: pleasant and cooperative, no obvious depression or anxiety, speech and thought processing grossly intact ? ?ASSESSMENT AND PLAN: ? ?Discussed the following assessment and plan: ? ?Allergic conjunctivitis of both eyes - Plan: Olopatadine HCl (PAZEO) 0.7 % SOLN ?We discussed differential diagnosis, history is more suggestive of allergic conjunctivitis. ?Recommend Pazeo 1 drop daily in each eye. ?If symptoms are not improved in 2 to 3 days, he was instructed to let me know, in which case I would consider topical antibiotic with a steroid. ?Continue Zyrtec 10 mg daily. ?He was clearly instructed about warning signs. ? ?We discussed possible serious and likely etiologies, options for evaluation and workup, limitations of telemedicine visit vs in person visit, treatment, treatment risks and precautions. ?The patient was advised to call back or seek an in-person evaluation if the symptoms worsen or if the condition fails to improve as anticipated. ?I discussed the assessment and treatment plan with the patient. The patient was provided an opportunity to ask questions and all were answered. The patient agreed with the plan and demonstrated an understanding of the instructions. ? ?Return if symptoms worsen or fail to improve. ? ?Jordell Outten G. Martinique, MD ? ?[place]. ?[place] office. ? ? ? ?

## 2022-01-06 ENCOUNTER — Telehealth (INDEPENDENT_AMBULATORY_CARE_PROVIDER_SITE_OTHER): Payer: BC Managed Care – PPO | Admitting: Family Medicine

## 2022-01-06 ENCOUNTER — Encounter: Payer: Self-pay | Admitting: Family Medicine

## 2022-01-06 VITALS — Ht 73.0 in

## 2022-01-06 DIAGNOSIS — M2550 Pain in unspecified joint: Secondary | ICD-10-CM

## 2022-01-06 DIAGNOSIS — M109 Gout, unspecified: Secondary | ICD-10-CM

## 2022-01-06 MED ORDER — PREDNISONE 20 MG PO TABS: 40.0000 mg | ORAL_TABLET | Freq: Every day | ORAL | 0 refills | Status: AC

## 2022-01-06 NOTE — Progress Notes (Signed)
Virtual Visit via Video Note I connected with Miguel Sanders on 01/06/22 by a video enabled telemedicine application and verified that I am speaking with the correct person using two identifiers.  Location patient: home Location provider:work office Persons participating in the virtual visit: patient, provider  I discussed the limitations of evaluation and management by telemedicine and the availability of in person appointments. The patient expressed understanding and agreed to proceed.  Chief Complaint  Patient presents with   Gout    Flare up on middle finger on left hand & knees aching   HPI: Miguel Sanders is a 46 year old male with history of gout, DM 2, and hyperlipidemia complaining of sudden onset of left middle finger pain, edema, and erythema. Symptoms noted today. No history of trauma.  Bilateral knee pain when squatting or kneeling. No edema or erythema. Right ankle pain with movement. No limitation of ROM.  He is currently on allopurinol 100 mg daily. Last gout attack over a year ago.  He has not tried OTC medication.  Negative for fever, chills, numbness,tingling,or skin rash. He has not had CP,SOB,palpitations,abdominal pain,or nausea.  ROS: See pertinent positives and negatives per HPI.  Past Medical History:  Diagnosis Date   Diabetes mellitus without complication ([facility])    Gout    Hyperlipidemia    Obesity, Class III, BMI 40-49.9 (morbid obesity) ([place])    Past Surgical History:  Procedure Laterality Date   BACK SURGERY     SPINE SURGERY     Family History  Problem Relation Age of Onset   Arthritis Mother    Colon cancer Maternal Grandfather 45   Cancer Other    Hypertension Other    Social History   Socioeconomic History   Marital status: Married    Spouse name: Not on file   Number of children: 2   Years of education: Not on file   Highest education level: Not on file  Occupational History   Occupation: unemployed  Tobacco Use   Smoking  status: Former    Packs/day: 1.50    Years: 10.00    Pack years: 15.00    Types: Cigarettes    Quit date: 08/09/2009    Years since quitting: 12.4   Smokeless tobacco: Never  Substance and Sexual Activity   Alcohol use: No   Drug use: No   Sexual activity: Yes  Other Topics Concern   Not on file  Social History Narrative   Marital status: married x 5 years. Separated from wife in 08/2012.     Children: 2 children from previous marriage; gets children every two weeks; sees more frequently.     Lives: alone; separated from wife in 08/2012.     Employment: Glass blower/designer.      Tobacco: quit; smoked x 13 years.      Alcohol: once every three months.      Drugs: none now; marijuana in past.      Exercise: started going to gym in 08/2012; going 4-5 days per week; elliptical and stationary bike.            Social Determinants of Health   Financial Resource Strain: Not on file  Food Insecurity: Not on file  Transportation Needs: Not on file  Physical Activity: Not on file  Stress: Not on file  Social Connections: Not on file  Intimate Partner Violence: Not on file   Current Outpatient Medications:    allopurinol (ZYLOPRIM) 100 MG tablet, TAKE 1 TABLET(100 MG) BY MOUTH DAILY,  Disp: 90 tablet, Rfl: 1   Insulin Glargine (BASAGLAR KWIKPEN) 100 UNIT/ML, Inject 18 Units into the skin at bedtime., Disp: 15 mL, Rfl: 1   Insulin Pen Needle (B-D UF III MINI PEN NEEDLES) 31G X 5 MM MISC, USE AS DIRECTED, Disp: 100 each, Rfl: 0   liraglutide (VICTOZA) 18 MG/3ML SOPN, ADMINISTER 1.8 MG UNDER THE SKIN DAILY, Disp: 18 mL, Rfl: 2   MITIGARE 0.6 MG CAPS, TAKE 1 TABLET BY MOUTH DAILY AS NEEDED. HOLD ATORVASTAIN WHEN TAKING THIS MEDICATION, Disp: 30 capsule, Rfl: 3   Olopatadine HCl (PAZEO) 0.7 % SOLN, Apply 1 drop to eye daily., Disp: 2.5 mL, Rfl: 2   pravastatin (PRAVACHOL) 40 MG tablet, TAKE 1 TABLET(40 MG) BY MOUTH DAILY, Disp: 90 tablet, Rfl: 1  EXAM:  VITALS per patient if applicable:Ht '6\' 1"'$   (1.854 m)   BMI 48.30 kg/m   GENERAL: alert, oriented, appears well and in no acute distress  HEENT: atraumatic, conjunctiva clear, no obvious abnormalities on inspection.  NECK: normal movements of the head and neck  LUNGS: on inspection no signs of respiratory distress, breathing rate appears normal, no obvious gross SOB, gasping or wheezing  CV: no obvious cyanosis  MS: moves all visible extremities without noticeable abnormality. Left hand: PIP joint of middle finger with mild edema and erythema. No limitation of ROM.He is not able to place camera to show me knees or ankles.  PSYCH/NEURO: pleasant and cooperative, no obvious depression or anxiety, speech and thought processing grossly intact  ASSESSMENT AND PLAN:  Discussed the following assessment and plan:  Acute gout of left hand, unspecified cause Middle finger has some findings suggestive of gout, no hx of trauma, so we will hold on imaging. Prednisone 40 mg daily x 5 days, recommend starting tomorrow with breakfast. We discussed some side effects of Prednisone, including GI and elevation of BS's. Monitor BS's closely. If not better, we will arrange X ray. No changes in Allopurinol dose. Excuse note for work provided.  - predniSONE (DELTASONE) 20 MG tablet; Take 2 tablets (40 mg total) by mouth daily with breakfast for 5 days.  Dispense: 10 tablet; Refill: 0  Polyarthralgia Knee pain and ankle pain are not very suggestive of acute gout. Could be OA. Wt loss will help.   We discussed possible serious and likely etiologies, options for evaluation and workup, limitations of telemedicine visit vs in person visit, treatment, treatment risks and precautions. The patient was advised to call back or seek an in-person evaluation if the symptoms worsen or if the condition fails to improve as anticipated. I discussed the assessment and treatment plan with the patient. The patient was provided an opportunity to ask questions and  all were answered. The patient agreed with the plan and demonstrated an understanding of the instructions.  Return if symptoms worsen or fail to improve, for Keep next appt..  Betty G. Martinique, MD  [facility]. [place] office.

## 2022-01-12 ENCOUNTER — Emergency Department (HOSPITAL_BASED_OUTPATIENT_CLINIC_OR_DEPARTMENT_OTHER): Payer: BC Managed Care – PPO

## 2022-01-12 ENCOUNTER — Emergency Department (HOSPITAL_BASED_OUTPATIENT_CLINIC_OR_DEPARTMENT_OTHER)
Admission: EM | Admit: 2022-01-12 | Discharge: 2022-01-12 | Disposition: A | Discharge status: Home / Self Care | Payer: BC Managed Care – PPO | Attending: Emergency Medicine | Admitting: Emergency Medicine

## 2022-01-12 ENCOUNTER — Other Ambulatory Visit: Payer: Self-pay

## 2022-01-12 ENCOUNTER — Encounter (HOSPITAL_BASED_OUTPATIENT_CLINIC_OR_DEPARTMENT_OTHER): Payer: Self-pay | Admitting: Emergency Medicine

## 2022-01-12 DIAGNOSIS — E1165 Type 2 diabetes mellitus with hyperglycemia: Secondary | ICD-10-CM | POA: Insufficient documentation

## 2022-01-12 DIAGNOSIS — Z7985 Long-term (current) use of injectable non-insulin antidiabetic drugs: Secondary | ICD-10-CM | POA: Insufficient documentation

## 2022-01-12 DIAGNOSIS — Z794 Long term (current) use of insulin: Secondary | ICD-10-CM | POA: Diagnosis not present

## 2022-01-12 DIAGNOSIS — R112 Nausea with vomiting, unspecified: Secondary | ICD-10-CM | POA: Diagnosis present

## 2022-01-12 DIAGNOSIS — E860 Dehydration: Secondary | ICD-10-CM | POA: Diagnosis not present

## 2022-01-12 DIAGNOSIS — R000 Tachycardia, unspecified: Secondary | ICD-10-CM | POA: Diagnosis not present

## 2022-01-12 LAB — CBC WITH DIFFERENTIAL/PLATELET
Abs Immature Granulocytes: 0.02 10*3/uL (ref 0.00–0.07)
Basophils Absolute: 0 10*3/uL (ref 0.0–0.1)
Basophils Relative: 0 %
Eosinophils Absolute: 0.2 10*3/uL (ref 0.0–0.5)
Eosinophils Relative: 2 %
HCT: 46.9 % (ref 39.0–52.0)
Hemoglobin: 16.3 g/dL (ref 13.0–17.0)
Immature Granulocytes: 0 %
Lymphocytes Relative: 10 %
Lymphs Abs: 0.8 10*3/uL (ref 0.7–4.0)
MCH: 29.5 pg (ref 26.0–34.0)
MCHC: 34.8 g/dL (ref 30.0–36.0)
MCV: 85 fL (ref 80.0–100.0)
Monocytes Absolute: 0.6 10*3/uL (ref 0.1–1.0)
Monocytes Relative: 8 %
Neutro Abs: 6.3 10*3/uL (ref 1.7–7.7)
Neutrophils Relative %: 80 %
Platelets: 169 10*3/uL (ref 150–400)
RBC: 5.52 MIL/uL (ref 4.22–5.81)
RDW: 12.4 % (ref 11.5–15.5)
WBC: 8 10*3/uL (ref 4.0–10.5)
nRBC: 0 % (ref 0.0–0.2)

## 2022-01-12 LAB — COMPREHENSIVE METABOLIC PANEL
ALT: 40 U/L (ref 0–44)
AST: 16 U/L (ref 15–41)
Albumin: 3.8 g/dL (ref 3.5–5.0)
Alkaline Phosphatase: 67 U/L (ref 38–126)
Anion gap: 14 (ref 5–15)
BUN: 22 mg/dL — ABNORMAL HIGH (ref 6–20)
CO2: 24 mmol/L (ref 22–32)
Calcium: 9.9 mg/dL (ref 8.9–10.3)
Chloride: 100 mmol/L (ref 98–111)
Creatinine, Ser: 1.06 mg/dL (ref 0.61–1.24)
GFR, Estimated: >60 mL/min (ref >60)
Glucose, Bld: 247 mg/dL — ABNORMAL HIGH (ref 70–99)
Potassium: 3.9 mmol/L (ref 3.5–5.1)
Sodium: 138 mmol/L (ref 135–145)
Total Bilirubin: 0.7 mg/dL (ref 0.3–1.2)
Total Protein: 7.6 g/dL (ref 6.5–8.1)

## 2022-01-12 LAB — I-STAT VENOUS BLOOD GAS, ED
Acid-Base Excess: 2 mmol/L (ref 0.0–2.0)
Bicarbonate: 26.3 mmol/L (ref 20.0–28.0)
Calcium, Ion: 1.23 mmol/L (ref 1.15–1.40)
HCT: 46 % (ref 39.0–52.0)
Hemoglobin: 15.6 g/dL (ref 13.0–17.0)
O2 Saturation: 81 %
Patient temperature: 99.4
Potassium: 3.8 mmol/L (ref 3.5–5.1)
Sodium: 137 mmol/L (ref 135–145)
TCO2: 27 mmol/L (ref 22–32)
pCO2, Ven: 38.8 mmHg — ABNORMAL LOW (ref 44–60)
pH, Ven: 7.44 — ABNORMAL HIGH (ref 7.25–7.43)
pO2, Ven: 45 mmHg (ref 32–45)

## 2022-01-12 LAB — URINALYSIS, ROUTINE W REFLEX MICROSCOPIC
Bilirubin Urine: NEGATIVE
Glucose, UA: 500 mg/dL — AB
Hgb urine dipstick: NEGATIVE
Ketones, ur: NEGATIVE mg/dL
Leukocytes,Ua: NEGATIVE
Nitrite: NEGATIVE
Specific Gravity, Urine: >1.046 — ABNORMAL HIGH (ref 1.005–1.030)
pH: 6 (ref 5.0–8.0)

## 2022-01-12 LAB — CBG MONITORING, ED
Glucose-Capillary: 258 mg/dL — ABNORMAL HIGH (ref 70–99)
Glucose-Capillary: 278 mg/dL — ABNORMAL HIGH (ref 70–99)

## 2022-01-12 LAB — BETA-HYDROXYBUTYRIC ACID: Beta-Hydroxybutyric Acid: 0.21 mmol/L (ref 0.05–0.27)

## 2022-01-12 LAB — LIPASE, BLOOD: Lipase: 122 U/L — ABNORMAL HIGH (ref 11–51)

## 2022-01-12 MED ORDER — SODIUM CHLORIDE 0.9 % IV BOLUS: 1000.0000 mL | Freq: Once | INTRAVENOUS | Status: DC

## 2022-01-12 MED ORDER — ACETAMINOPHEN 500 MG PO TABS
1000.0000 mg | ORAL_TABLET | Freq: Once | ORAL | Status: AC
  Administered 2022-01-12: 1000 mg via ORAL
  Filled 2022-01-12: qty 2

## 2022-01-12 MED ORDER — SODIUM CHLORIDE 0.9 % IV BOLUS
1000.0000 mL | Freq: Once | INTRAVENOUS | Status: AC
  Administered 2022-01-12: 1000 mL via INTRAVENOUS

## 2022-01-12 MED ORDER — ONDANSETRON 4 MG PO TBDP: 4.0000 mg | ORAL_TABLET | Freq: Three times a day (TID) | ORAL | 0 refills | Status: DC | PRN

## 2022-01-12 MED ORDER — IOHEXOL 300 MG/ML  SOLN
100.0000 mL | Freq: Once | INTRAMUSCULAR | Status: AC | PRN
Start: 2022-01-12 — End: 2022-01-12
  Administered 2022-01-12: 100 mL via INTRAVENOUS

## 2022-01-12 MED ORDER — BLOOD GLUCOSE MONITOR KIT: PACK | 0 refills | Status: AC

## 2022-01-12 MED ORDER — ONDANSETRON HCL 4 MG/2ML IJ SOLN
4.0000 mg | Freq: Once | INTRAMUSCULAR | Status: AC
  Administered 2022-01-12: 4 mg via INTRAVENOUS
  Filled 2022-01-12: qty 2

## 2022-01-12 NOTE — ED Triage Notes (Signed)
Pt arrives to ED with c/o nausea and vomiting. Intermittent nausea over the past few days and vomiting that started this morning. Episodes of emesis x4. Hx T2DM, associated symptoms include polydipsia.

## 2022-01-12 NOTE — ED Provider Notes (Signed)
[facility] EMERGENCY DEPT Provider Note   CSN: [id number] Arrival date & time: 01/12/22  3329     History  Chief Complaint  Patient presents with   Emesis   Nausea    Miguel Sanders is a 46 y.o. male.  Pt is a 46 yo male with a pmhx significant for DM2, morbid obesity, hyperlipidemia, and gout.  Pt has just finished a course of prednisone for his gout.  Gout is better now.  Pt said he's been feeling nauseous for the past few days and has vomited several times this am.  He feels very thirsty.  He denies any pain.  He did take his victoza and his basaglar insulin last night.      Home Medications Prior to Admission medications   Medication Sig Start Date End Date Taking? Authorizing Provider  blood glucose meter kit and supplies KIT Dispense based on patient and insurance preference. Use up to four times daily as directed. 01/12/22  Yes Isla Pence, MD  ondansetron (ZOFRAN-ODT) 4 MG disintegrating tablet Take 1 tablet (4 mg total) by mouth every 8 (eight) hours as needed for nausea or vomiting. 01/12/22  Yes Isla Pence, MD  allopurinol (ZYLOPRIM) 100 MG tablet TAKE 1 TABLET(100 MG) BY MOUTH DAILY 12/21/21   Martinique, Betty G, MD  Insulin Glargine [facility]) 100 UNIT/ML Inject 18 Units into the skin at bedtime. 12/16/21   Martin[place], Betty G, MD  Insulin Pen Needle (B-D UF III MINI PEN NEEDLES) 31G X 5 MM MISC USE AS DIRECTED 11/24/21   Martin[place], Betty G, MD  liraglutide (VICTOZA) 18 MG/3ML SOPN ADMINISTER 1.8 MG UNDER THE SKIN DAILY 12/16/21   Martinique, Betty G, MD  MITIGARE 0.6 MG CAPS TAKE 1 TABLET BY MOUTH DAILY AS NEEDED. HOLD ATORVASTAIN WHEN TAKING THIS MEDICATION 02/07/18   Martinique, Betty G, MD  Olopatadine HCl (PAZEO) 0.7 % SOLN Apply 1 drop to eye daily. 12/21/21   Martinique, Betty G, MD  pravastatin (PRAVACHOL) 40 MG tablet TAKE 1 TABLET(40 MG) BY MOUTH DAILY 12/21/21   Martinique, Betty G, MD      Allergies    Patient has no known allergies.    Review of Systems    Review of Systems  Gastrointestinal:  Positive for nausea and vomiting.  All other systems reviewed and are negative.  Physical Exam Updated Vital Signs BP 113/75   Pulse 89   Temp 99.8 F (37.7 C) (Oral)   Resp 17   Ht $R'6\' 1"'iU$  (1.854 m)   Wt (!) 172.4 kg   SpO2 99%   BMI 50.13 kg/m  Physical Exam Vitals and nursing note reviewed.  Constitutional:      Appearance: Normal appearance. He is obese.  HENT:     Head: Normocephalic and atraumatic.     Right Ear: External ear normal.     Left Ear: External ear normal.     Nose: Nose normal.     Mouth/Throat:     Mouth: Mucous membranes are dry.  Eyes:     Extraocular Movements: Extraocular movements intact.     Conjunctiva/sclera: Conjunctivae normal.     Pupils: Pupils are equal, round, and reactive to light.  Cardiovascular:     Rate and Rhythm: Regular rhythm. Tachycardia present.     Pulses: Normal pulses.     Heart sounds: Normal heart sounds.  Pulmonary:     Effort: Pulmonary effort is normal.     Breath sounds: Normal breath sounds.  Abdominal:  General: Abdomen is flat. Bowel sounds are normal.     Palpations: Abdomen is soft.  Musculoskeletal:        General: Normal range of motion.     Cervical back: Normal range of motion and neck supple.  Skin:    General: Skin is warm.     Capillary Refill: Capillary refill takes less than 2 seconds.  Neurological:     General: No focal deficit present.     Mental Status: He is alert and oriented to person, place, and time.  Psychiatric:        Mood and Affect: Mood normal.        Behavior: Behavior normal.    ED Results / Procedures / Treatments   Labs (all labs ordered are listed, but only abnormal results are displayed) Labs Reviewed  COMPREHENSIVE METABOLIC PANEL - Abnormal; Notable for the following components:      Result Value   Glucose, Bld 247 (*)    BUN 22 (*)    All other components within normal limits  LIPASE, BLOOD - Abnormal; Notable for the  following components:   Lipase 122 (*)    All other components within normal limits  URINALYSIS, ROUTINE W REFLEX MICROSCOPIC - Abnormal; Notable for the following components:   Specific Gravity, Urine >1.046 (*)    Glucose, UA 500 (*)    Protein, ur TRACE (*)    Bacteria, UA RARE (*)    All other components within normal limits  CBG MONITORING, ED - Abnormal; Notable for the following components:   Glucose-Capillary 258 (*)    All other components within normal limits  I-STAT VENOUS BLOOD GAS, ED - Abnormal; Notable for the following components:   pH, Ven 7.440 (*)    pCO2, Ven 38.8 (*)    All other components within normal limits  CBG MONITORING, ED - Abnormal; Notable for the following components:   Glucose-Capillary 278 (*)    All other components within normal limits  CBC WITH DIFFERENTIAL/PLATELET  BETA-HYDROXYBUTYRIC ACID    EKG EKG Interpretation  Date/Time:  Tuesday January 12 2022 09:29:04 EDT Ventricular Rate:  107 PR Interval:  188 QRS Duration: 115 QT Interval:  348 QTC Calculation: 465 R Axis:   -28 Text Interpretation: Sinus tachycardia Incomplete right bundle branch block No old tracing to compare Confirmed by Isla Pence [phone]) on 01/12/2022 9:32:30 AM  Radiology CT ABDOMEN PELVIS W CONTRAST  Result Date: 01/12/2022 CLINICAL DATA:  Abdominal pain with nausea vomiting. EXAM: CT ABDOMEN AND PELVIS WITH CONTRAST TECHNIQUE: Multidetector CT imaging of the abdomen and pelvis was performed using the standard protocol following bolus administration of intravenous contrast. RADIATION DOSE REDUCTION: This exam was performed according to the departmental dose-optimization program which includes automated exposure control, adjustment of the mA and/or kV according to patient size and/or use of iterative reconstruction technique. CONTRAST:  168mL OMNIPAQUE IOHEXOL 300 MG/ML  SOLN COMPARISON:  04/21/2011 FINDINGS: Lower chest: Unremarkable. Hepatobiliary: No suspicious focal  abnormality within the liver parenchyma. The liver shows diffusely decreased attenuation suggesting fat deposition. There is no evidence for gallstones, gallbladder wall thickening, or pericholecystic fluid. No intrahepatic or extrahepatic biliary dilation. Pancreas: No focal mass lesion. No dilatation of the main duct. No intraparenchymal cyst. No peripancreatic edema. Spleen: No splenomegaly. No focal mass lesion. Adrenals/Urinary Tract: No adrenal nodule or mass. Kidneys unremarkable. No evidence for hydroureter. The urinary bladder appears normal for the degree of distention. Stomach/Bowel: Stomach is unremarkable. No gastric wall thickening. No evidence of  outlet obstruction. Duodenum is normally positioned as is the ligament of Treitz. No small bowel wall thickening. No small bowel dilatation. The terminal ileum is normal. The appendix is normal. No gross colonic mass. No colonic wall thickening. Vascular/Lymphatic: There is mild atherosclerotic calcification of the abdominal aorta without aneurysm. There is no gastrohepatic or hepatoduodenal ligament lymphadenopathy. No retroperitoneal or mesenteric lymphadenopathy. No pelvic sidewall lymphadenopathy. Reproductive: The prostate gland and seminal vesicles are unremarkable. Other: No intraperitoneal free fluid. Musculoskeletal: No worrisome lytic or sclerotic osseous abnormality. IMPRESSION: 1. No acute findings in the abdomen or pelvis. 2. Hepatic steatosis. 3. Aortic Atherosclerosis (ICD10-I70.0). Electronically Signed   By: Misty Stanley M.D.   On: 01/12/2022 10:54   DG Chest Portable 1 View  Result Date: 01/12/2022 CLINICAL DATA:  n/vNausea x 4 days with vomiting that started this am. EXAM: PORTABLE CHEST 1 VIEW COMPARISON:  07/25/2009 scattered 12170 chest radiograph, report only. FINDINGS: Two frontal views of the chest. Midline trachea. Normal heart size and mediastinal contours. The right costophrenic angle is minimally excluded. No pleural effusion  or pneumothorax. Diffuse peribronchial thickening. Clear lungs. No free intraperitoneal air. IMPRESSION: No active disease. Electronically Signed   By: Abigail Miyamoto M.D.   On: 01/12/2022 09:24    Procedures Procedures    Medications Ordered in ED Medications  sodium chloride 0.9 % bolus 1,000 mL (0 mLs Intravenous Stopped 01/12/22 1010)  ondansetron (ZOFRAN) injection 4 mg (4 mg Intravenous Given 01/12/22 0859)  sodium chloride 0.9 % bolus 1,000 mL (0 mLs Intravenous Stopped 01/12/22 1153)  iohexol (OMNIPAQUE) 300 MG/ML solution 100 mL (100 mLs Intravenous Contrast Given 01/12/22 1018)  sodium chloride 0.9 % bolus 1,000 mL (0 mLs Intravenous Stopped 01/12/22 1334)  acetaminophen (TYLENOL) tablet 1,000 mg (1,000 mg Oral Given 01/12/22 1200)    ED Course/ Medical Decision Making/ A&P                           Medical Decision Making Amount and/or Complexity of Data Reviewed Labs: ordered. Radiology: ordered.  Risk OTC drugs. Prescription drug management.   This patient presents to the ED for concern of n/v, this involves an extensive number of treatment options, and is a complaint that carries with it a high risk of complications and morbidity.  The differential diagnosis includes dka, hhs, infection   Co morbidities that complicate the patient evaluation  DM2, morbid obesity, hyperlipidemia, and gout   Additional history obtained:  Additional history obtained from epic chart review    Lab Tests:  I Ordered, and personally interpreted labs.  The pertinent results include:  cbc nl; vbg nl   Imaging Studies ordered:  I ordered imaging studies including cxr and CT abd/pelvis I independently visualized and interpreted imaging which showed  CXR:   IMPRESSION:  No active disease.  CT abd/pelvis:   IMPRESSION:  1. No acute findings in the abdomen or pelvis.  2. Hepatic steatosis.  3. Aortic Atherosclerosis (ICD10-I70.0).   I agree with the radiologist  interpretation   Cardiac Monitoring:  The patient was maintained on a cardiac monitor.  I personally viewed and interpreted the cardiac monitored which showed an underlying rhythm of: st   Medicines ordered and prescription drug management:  I ordered medication including ivfs  for dehydration  Reevaluation of the patient after these medicines showed that the patient improved I have reviewed the patients home medicines and have made adjustments as needed   Test Considered:  ct  Critical Interventions:  ivfs   Problem List / ED Course:  Hyperglycemia:  Due to prednisone.  No evidence of DKA.  BS in the 200s upon d/c.  Pt does not have a monitor to check bs.  He is given a rx for a glucometer. Dehydration:  pt given ivfs.  HR is much better after fluids. N/v:  CT is neg.  Labs unremarkable other than a small bump in lip, but pancreas nl on ct.  Pt is feeling better after zofran.  He is able to tolerate po fluids + crackers.  Pt d/c with a rx for zofran.   Reevaluation:  After the interventions noted above, I reevaluated the patient and found that they have :improved   Social Determinants of Health:  Lives at home   Dispostion:  After consideration of the diagnostic results and the patients response to treatment, I feel that the patent would benefit from discharge with outpatient f/u.          Final Clinical Impression(s) / ED Diagnoses Final diagnoses:  Hyperglycemia due to diabetes mellitus ([place])  Dehydration    Rx / DC Orders ED Discharge Orders          Ordered    ondansetron (ZOFRAN-ODT) 4 MG disintegrating tablet  Every 8 hours PRN        01/12/22 1358    blood glucose meter kit and supplies KIT        01/12/22 1358              Isla Pence, MD 01/12/22 1403

## 2022-01-12 NOTE — ED Notes (Signed)
Provider requested Food and water to see if he can tolerate. Pt was able to tolerate. Provider informed.

## 2022-01-12 NOTE — ED Notes (Signed)
Pt's CBG result was 258. Informed Kaitlin - RN and Dr. Gilford Raid.

## 2022-01-13 ENCOUNTER — Telehealth: Payer: Self-pay

## 2022-01-13 NOTE — Telephone Encounter (Signed)
Transition Care Management Follow-up Telephone Call Date of discharge and from where: 01/12/22 [facility] How have you been since you were released from the hospital? Pt states he feels much better than he did yesterday Any questions or concerns? No  Items Reviewed: Did the pt receive and understand the discharge instructions provided? Yes  Medications obtained and verified?  N/a Other? Yes ; Pt completed course of steroids on Mon 01/11/22 Any new allergies since your discharge? No  Dietary orders reviewed? No Do you have support at home? Yes   Home Care and Equipment/Supplies: Were home health services ordered? not applicable   Follow up appointments reviewed:  PCP Hospital f/u appt confirmed? No  Scheduled to see Martinique on 02/05/22 @ 0730. Are transportation arrangements needed? No  If their condition worsens, is the pt aware to call PCP or go to the Emergency Dept.? Yes Was the patient provided with contact information for the PCP's office or ED? Yes Was to pt encouraged to call back with questions or concerns? Yes

## 2022-02-03 NOTE — Progress Notes (Unsigned)
HPI: [name] is a 46 y.o.male here today for his routine physical examination.  Last CPE: 2021  He does not have an exercise routine but he is active at work. Following a healthful diet: Eats out for lunch and dinner at home.Drinks diet coke and usually skips breakfast.  Chronic medical problems: DM II,gout,HLD, and OSA among some.  Immunization History  Administered Date(s) Administered   Tdap 05/01/2018   Health Maintenance  Topic Date Due   COVID-19 Vaccine (1) 02/21/2022 (Originally 09/28/1976)   HIV Screening  05/17/2023 (Originally 03/28/1991)   INFLUENZA VACCINE  03/09/2022   HEMOGLOBIN A1C  06/16/2022   OPHTHALMOLOGY EXAM  07/29/2022   FOOT EXAM  02/06/2023   URINE MICROALBUMIN  02/06/2023   TETANUS/TDAP  05/01/2028   Hepatitis C Screening  Completed   HPV VACCINES  Aged Out   He has no concerns today. DM II on Victoza and Basaglar. Lab Results  Component Value Date   HGBA1C 9.0 (H) 12/14/2021   HLD on Pravastatin 40 mg daily.  Lab Results  Component Value Date   CHOL 169 05/14/2020   HDL 27 (L) 05/14/2020   LDLCALC  05/14/2020     Comment:     . LDL cholesterol not calculated. Triglyceride levels greater than 400 mg/dL invalidate calculated LDL results. . Reference range: <100 . Desirable range <100 mg/dL for primary prevention;   <70 mg/dL for patients with CHD or diabetic patients  with > or = 2 CHD risk factors. Marland Kitchen LDL-C is now calculated using the Martin-Hopkins  calculation, which is a validated novel method providing  better accuracy than the Friedewald equation in the  estimation of LDL-C.  Horald Pollen et al. Lenox Ahr. 5137;867(19): 2061-2068  (http://education.[facility].com/faq/FAQ164)    LDLDIRECT 91.0 11/12/2019   TRIG 598 (H) 05/14/2020   CHOLHDL 6.3 (H) 05/14/2020   Evaluated in the ED on 01/12/22 because abdominal pain, nausea,and vomiting. Symptoms have resolved. Lipase was mildly elevated. Negative for high  alcohol intake.  Rest otherwise normal. Abdominal/pelvic CT showed hepatic steatosis and aortic atherosclerosis.  Lab Results  Component Value Date   LIPASE 122 (H) 01/12/2022   Review of Systems  Constitutional:  Positive for fatigue. Negative for activity change, appetite change and fever.  HENT:  Negative for mouth sores, nosebleeds and sore throat.   Eyes:  Negative for redness and visual disturbance.  Respiratory:  Negative for cough, shortness of breath and wheezing.   Cardiovascular:  Negative for chest pain, palpitations and leg swelling.  Gastrointestinal:  Negative for abdominal pain, blood in stool, nausea and vomiting.  Endocrine: Negative for cold intolerance, heat intolerance, polydipsia, polyphagia and polyuria.  Genitourinary:  Negative for decreased urine volume, dysuria, genital sores, hematuria and testicular pain.  Musculoskeletal:  Negative for gait problem and myalgias.  Skin:  Negative for color change and rash.  Neurological:  Negative for syncope, weakness and headaches.  Hematological:  Negative for adenopathy. Does not bruise/bleed easily.  Psychiatric/Behavioral:  Negative for confusion. The patient is not nervous/anxious.   All other systems reviewed and are negative.  Current Outpatient Medications on File Prior to Visit  Medication Sig Dispense Refill   allopurinol (ZYLOPRIM) 100 MG tablet TAKE 1 TABLET(100 MG) BY MOUTH DAILY 90 tablet 1   blood glucose meter kit and supplies KIT Dispense based on patient and insurance preference. Use up to four times daily as directed. 1 each 0   Insulin Glargine (BASAGLAR KWIKPEN) 100 UNIT/ML Inject 18 Units into the skin  at bedtime. 15 mL 1   Insulin Pen Needle (B-D UF III MINI PEN NEEDLES) 31G X 5 MM MISC USE AS DIRECTED 100 each 0   liraglutide (VICTOZA) 18 MG/3ML SOPN ADMINISTER 1.8 MG UNDER THE SKIN DAILY 18 mL 2   MITIGARE 0.6 MG CAPS TAKE 1 TABLET BY MOUTH DAILY AS NEEDED. HOLD ATORVASTAIN WHEN TAKING THIS  MEDICATION 30 capsule 3   Olopatadine HCl (PAZEO) 0.7 % SOLN Apply 1 drop to eye daily. 2.5 mL 2   ondansetron (ZOFRAN-ODT) 4 MG disintegrating tablet Take 1 tablet (4 mg total) by mouth every 8 (eight) hours as needed for nausea or vomiting. 20 tablet 0   pravastatin (PRAVACHOL) 40 MG tablet TAKE 1 TABLET(40 MG) BY MOUTH DAILY 90 tablet 1   No current facility-administered medications on file prior to visit.   Past Medical History:  Diagnosis Date   Diabetes mellitus without complication (HCC)    Gout    Hyperlipidemia    Obesity, Class III, BMI 40-49.9 (morbid obesity) ([place])    Past Surgical History:  Procedure Laterality Date   BACK SURGERY     SPINE SURGERY     No Known Allergies  Family History  Problem Relation Age of Onset   Arthritis Mother    Colon cancer Maternal Grandfather 24   Cancer Other    Hypertension Other    Social History   Socioeconomic History   Marital status: Married    Spouse name: Not on file   Number of children: 2   Years of education: Not on file   Highest education level: Not on file  Occupational History   Occupation: unemployed  Tobacco Use   Smoking status: Former    Packs/day: 1.50    Years: 10.00    Total pack years: 15.00    Types: Cigarettes    Quit date: 08/09/2009    Years since quitting: 12.5   Smokeless tobacco: Never  Substance and Sexual Activity   Alcohol use: No   Drug use: No   Sexual activity: Yes  Other Topics Concern   Not on file  Social History Narrative   Marital status: married x 5 years. Separated from wife in 08/2012.     Children: 2 children from previous marriage; gets children every two weeks; sees more frequently.     Lives: alone; separated from wife in 08/2012.     Employment: Glass blower/designer.      Tobacco: quit; smoked x 13 years.      Alcohol: once every three months.      Drugs: none now; marijuana in past.      Exercise: started going to gym in 08/2012; going 4-5 days per week; elliptical and  stationary bike.            Social Determinants of Health   Financial Resource Strain: Not on file  Food Insecurity: Not on file  Transportation Needs: Not on file  Physical Activity: Not on file  Stress: Not on file  Social Connections: Not on file   Vitals:   02/05/22 0727  BP: 128/80  Pulse: 87  Resp: 16  SpO2: 98%   Body mass index is 47.96 kg/m.  Wt Readings from Last 3 Encounters:  02/05/22 (!) 363 lb 8 oz (164.9 kg)  01/12/22 (!) 380 lb (172.4 kg)  12/14/21 (!) 366 lb 2 oz (166.1 kg)   Physical Exam Vitals and nursing note reviewed.  Constitutional:      General: He is not in  acute distress.    Appearance: He is well-developed.  HENT:     Head: Normocephalic and atraumatic.     Right Ear: Tympanic membrane, ear canal and external ear normal.     Left Ear: Tympanic membrane, ear canal and external ear normal.     Mouth/Throat:     Mouth: Mucous membranes are moist.     Pharynx: Oropharynx is clear.  Eyes:     Extraocular Movements: Extraocular movements intact.     Conjunctiva/sclera: Conjunctivae normal.     Pupils: Pupils are equal, round, and reactive to light.  Neck:     Thyroid: No thyromegaly.     Trachea: No tracheal deviation.  Cardiovascular:     Rate and Rhythm: Normal rate and regular rhythm.     Pulses:          Dorsalis pedis pulses are 2+ on the right side and 2+ on the left side.     Heart sounds: No murmur heard. Pulmonary:     Effort: Pulmonary effort is normal. No respiratory distress.     Breath sounds: Normal breath sounds.  Abdominal:     Palpations: Abdomen is soft. There is no hepatomegaly or mass.     Tenderness: There is no abdominal tenderness.  Genitourinary:    Comments: No concerns. Musculoskeletal:        General: No tenderness.     Cervical back: Normal range of motion.     Comments: No major deformities appreciated and no signs of synovitis.  Lymphadenopathy:     Cervical: No cervical adenopathy.     Upper Body:      Right upper body: No supraclavicular adenopathy.     Left upper body: No supraclavicular adenopathy.  Skin:    General: Skin is warm.     Findings: No erythema.  Neurological:     General: No focal deficit present.     Mental Status: He is alert and oriented to person, place, and time.     Cranial Nerves: No cranial nerve deficit.     Sensory: No sensory deficit.     Gait: Gait normal.     Deep Tendon Reflexes:     Reflex Scores:      Bicep reflexes are 2+ on the right side and 2+ on the left side.      Patellar reflexes are 2+ on the right side and 2+ on the left side. Psychiatric:        Mood and Affect: Mood and affect normal.   ASSESSMENT AND PLAN:  Mr.Dagon was seen today for annual exam.  Diagnoses and all orders for this visit: Orders Placed This Encounter  Procedures   Microalbumin / creatinine urine ratio   Lipid panel   Lipase   Basic metabolic panel   LDL cholesterol, direct   Ambulatory referral to Gastroenterology   Lab Results  Component Value Date   MICROALBUR 2.4 (H) 02/05/2022   MICROALBUR 29.5 05/14/2020   Lab Results  Component Value Date   CREATININE 0.85 02/05/2022   BUN 14 02/05/2022   NA 137 02/05/2022   K 4.0 02/05/2022   CL 104 02/05/2022   CO2 25 02/05/2022   Lab Results  Component Value Date   CHOL 149 02/05/2022   HDL 28.70 (L) 02/05/2022   [place]  05/14/2020     Comment:     . LDL cholesterol not calculated. Triglyceride levels greater than 400 mg/dL invalidate calculated LDL results. . Reference range: <100 . Desirable range <  100 mg/dL for primary prevention;   <70 mg/dL for patients with CHD or diabetic patients  with > or = 2 CHD risk factors. Marland Kitchen LDL-C is now calculated using the Martin-Hopkins  calculation, which is a validated novel method providing  better accuracy than the Friedewald equation in the  estimation of LDL-C.  Cresenciano Genre et al. Annamaria Helling. 5525;894(83): 2061-2068   (http://education.[facility].com/faq/FAQ164)    LDLDIRECT 75.0 02/05/2022   TRIG 354.0 (H) 02/05/2022   CHOLHDL 5 02/05/2022   Lab Results  Component Value Date   LIPASE 31.0 02/05/2022   Routine general medical examination at a health care facility We discussed the importance of regular physical activity and healthy diet for prevention of chronic illness and/or complications. Preventive guidelines reviewed. Vaccination: Declined vaccination. He will be 46 in 03/2022, so GI referral place to have colonoscopy after his birthday. Next CPE in a year.  Colon cancer screening -     Ambulatory referral to Gastroenterology  Elevated lipase Mild. TG 598 in 05/2020. Lipase added to his labs today.  Type 2 diabetes mellitus with other specified complication ([place]) Problem is not well controlled, HgA1C was 9.0 in 12/2021.Marland Kitchen No changes in current management. Annual eye exam, periodic dental and foot care recommended. F/U in 4 months  Hyperlipidemia associated with type 2 diabetes mellitus ([place]) LDL was 91 in 05/2020. Continue Pravastatin 40 mg daily. Further recommendations according to FLP result.   Atherosclerosis of aorta ([place]) We discussed imaging findings. Continue Pravastatin 40 mg daily.  Return in 18 weeks (on 06/11/2022).  Eldene Plocher G. Martinique, MD  [facility]. [place] office.

## 2022-02-05 ENCOUNTER — Ambulatory Visit (INDEPENDENT_AMBULATORY_CARE_PROVIDER_SITE_OTHER): Payer: BC Managed Care – PPO | Admitting: Family Medicine

## 2022-02-05 ENCOUNTER — Encounter: Payer: Self-pay | Admitting: Family Medicine

## 2022-02-05 VITALS — BP 128/80 | HR 87 | Resp 16 | Ht 73.0 in | Wt 363.5 lb

## 2022-02-05 DIAGNOSIS — I700 Atherosclerosis of aorta: Secondary | ICD-10-CM

## 2022-02-05 DIAGNOSIS — Z794 Long term (current) use of insulin: Secondary | ICD-10-CM

## 2022-02-05 DIAGNOSIS — Z1211 Encounter for screening for malignant neoplasm of colon: Secondary | ICD-10-CM

## 2022-02-05 DIAGNOSIS — Z0000 Encounter for general adult medical examination without abnormal findings: Secondary | ICD-10-CM | POA: Diagnosis not present

## 2022-02-05 DIAGNOSIS — R748 Abnormal levels of other serum enzymes: Secondary | ICD-10-CM

## 2022-02-05 DIAGNOSIS — E785 Hyperlipidemia, unspecified: Secondary | ICD-10-CM | POA: Diagnosis not present

## 2022-02-05 DIAGNOSIS — E1169 Type 2 diabetes mellitus with other specified complication: Secondary | ICD-10-CM

## 2022-02-05 DIAGNOSIS — E1149 Type 2 diabetes mellitus with other diabetic neurological complication: Secondary | ICD-10-CM

## 2022-02-05 LAB — LIPID PANEL
Cholesterol: 149 mg/dL (ref 0–200)
HDL: 28.7 mg/dL — ABNORMAL LOW (ref >39.00)
NonHDL: 120.57
Total CHOL/HDL Ratio: 5
Triglycerides: 354 mg/dL — ABNORMAL HIGH (ref 0.0–149.0)
VLDL: 70.8 mg/dL — ABNORMAL HIGH (ref 0.0–40.0)

## 2022-02-05 LAB — BASIC METABOLIC PANEL
BUN: 14 mg/dL (ref 6–23)
CO2: 25 mEq/L (ref 19–32)
Calcium: 9.3 mg/dL (ref 8.4–10.5)
Chloride: 104 mEq/L (ref 96–112)
Creatinine, Ser: 0.85 mg/dL (ref 0.40–1.50)
GFR: 105.42 mL/min (ref >60.00)
Glucose, Bld: 174 mg/dL — ABNORMAL HIGH (ref 70–99)
Potassium: 4 mEq/L (ref 3.5–5.1)
Sodium: 137 mEq/L (ref 135–145)

## 2022-02-05 LAB — LDL CHOLESTEROL, DIRECT: Direct LDL: 75 mg/dL

## 2022-02-05 LAB — MICROALBUMIN / CREATININE URINE RATIO
Creatinine,U: 122.6 mg/dL
Microalb Creat Ratio: 1.9 mg/g (ref 0.0–30.0)
Microalb, Ur: 2.4 mg/dL — ABNORMAL HIGH (ref 0.0–1.9)

## 2022-02-05 LAB — LIPASE: Lipase: 31 U/L (ref 11.0–59.0)

## 2022-02-05 NOTE — Assessment & Plan Note (Signed)
We discussed imaging findings. Continue Pravastatin 40 mg daily.

## 2022-02-05 NOTE — Assessment & Plan Note (Signed)
Problem is not well controlled, HgA1C was 9.0 in 12/2021.Marland Kitchen No changes in current management. Annual eye exam, periodic dental and foot care recommended. F/U in 4 months

## 2022-02-05 NOTE — Patient Instructions (Addendum)
A few things to remember from today's visit:  Routine general medical examination at a health care facility  Hyperlipidemia associated with type 2 diabetes mellitus ([place]) - Plan: Lipid panel  Colon cancer screening - Plan: Ambulatory referral to Gastroenterology  Type 2 diabetes mellitus with other specified complication, with long-term current use of insulin ([place]) - Plan: Microalbumin / creatinine urine ratio, Basic metabolic panel  Atherosclerosis of aorta (HCC)  Elevated lipase - Plan: Lipase  If you need refills please call your pharmacy. Do not use My Chart to request refills or for acute issues that need immediate attention.   Please be sure medication list is accurate. If a new problem present, please set up appointment sooner than planned today.  Health Maintenance, Male Adopting a healthy lifestyle and getting preventive care are important in promoting health and wellness. Ask your health care provider about: The right schedule for you to have regular tests and exams. Things you can do on your own to prevent diseases and keep yourself healthy. What should I know about diet, weight, and exercise? Eat a healthy diet  Eat a diet that includes plenty of vegetables, fruits, low-fat dairy products, and lean protein. Do not eat a lot of foods that are high in solid fats, added sugars, or sodium. Maintain a healthy weight Body mass index (BMI) is a measurement that can be used to identify possible weight problems. It estimates body fat based on height and weight. Your health care provider can help determine your BMI and help you achieve or maintain a healthy weight. Get regular exercise Get regular exercise. This is one of the most important things you can do for your health. Most adults should: Exercise for at least 150 minutes each week. The exercise should increase your heart rate and make you sweat (moderate-intensity exercise). Do strengthening exercises at least twice a week.  This is in addition to the moderate-intensity exercise. Spend less time sitting. Even light physical activity can be beneficial. Watch cholesterol and blood lipids Have your blood tested for lipids and cholesterol at 45 years of age, then have this test every 5 years. You may need to have your cholesterol levels checked more often if: Your lipid or cholesterol levels are high. You are older than 45 years of age. You are at high risk for heart disease. What should I know about cancer screening? Many types of cancers can be detected early and may often be prevented. Depending on your health history and family history, you may need to have cancer screening at various ages. This may include screening for: Colorectal cancer. Prostate cancer. Skin cancer. Lung cancer. What should I know about heart disease, diabetes, and high blood pressure? Blood pressure and heart disease High blood pressure causes heart disease and increases the risk of stroke. This is more likely to develop in people who have high blood pressure readings or are overweight. Talk with your health care provider about your target blood pressure readings. Have your blood pressure checked: Every 3-5 years if you are 47-67 years of age. Every year if you are 12 years old or older. If you are between the ages of 59 and 105 and are a current or former smoker, ask your health care provider if you should have a one-time screening for abdominal aortic aneurysm (AAA). Diabetes Have regular diabetes screenings. This checks your fasting blood sugar level. Have the screening done: Once every three years after age 14 if you are at a normal weight and have  a low risk for diabetes. More often and at a younger age if you are overweight or have a high risk for diabetes. What should I know about preventing infection? Hepatitis B If you have a higher risk for hepatitis B, you should be screened for this virus. Talk with your health care provider  to find out if you are at risk for hepatitis B infection. Hepatitis C Blood testing is recommended for: Everyone born from [birth year] through 1964. Anyone with known risk factors for hepatitis C. Sexually transmitted infections (STIs) You should be screened each year for STIs, including gonorrhea and chlamydia, if: You are sexually active and are younger than 45 years of age. You are older than 45 years of age and your health care provider tells you that you are at risk for this type of infection. Your sexual activity has changed since you were last screened, and you are at increased risk for chlamydia or gonorrhea. Ask your health care provider if you are at risk. Ask your health care provider about whether you are at high risk for HIV. Your health care provider may recommend a prescription medicine to help prevent HIV infection. If you choose to take medicine to prevent HIV, you should first get tested for HIV. You should then be tested every 3 months for as long as you are taking the medicine. Follow these instructions at home: Alcohol use Do not drink alcohol if your health care provider tells you not to drink. If you drink alcohol: Limit how much you have to 0-2 drinks a day. Know how much alcohol is in your drink. In the [place] one drink equals one 12 oz bottle of beer (355 mL), one 5 oz glass of wine (148 mL), or one 1 oz glass of hard liquor (44 mL). Lifestyle Do not use any products that contain nicotine or tobacco. These products include cigarettes, chewing tobacco, and vaping devices, such as e-cigarettes. If you need help quitting, ask your health care provider. Do not use street drugs. Do not share needles. Ask your health care provider for help if you need support or information about quitting drugs. General instructions Schedule regular health, dental, and eye exams. Stay current with your vaccines. Tell your health care provider if: You often feel depressed. You have ever been  abused or do not feel safe at home. Summary Adopting a healthy lifestyle and getting preventive care are important in promoting health and wellness. Follow your health care provider's instructions about healthy diet, exercising, and getting tested or screened for diseases. Follow your health care provider's instructions on monitoring your cholesterol and blood pressure. This information is not intended to replace advice given to you by your health care provider. Make sure you discuss any questions you have with your health care provider. Document Revised: 12/15/2020 Document Reviewed: 12/15/2020 Elsevier Patient Education  [place].

## 2022-02-05 NOTE — Assessment & Plan Note (Signed)
LDL was 91 in 05/2020. Continue Pravastatin 40 mg daily. Further recommendations according to FLP result.

## 2022-02-08 ENCOUNTER — Other Ambulatory Visit: Payer: Self-pay

## 2022-02-08 MED ORDER — ROSUVASTATIN CALCIUM 20 MG PO TABS: 20.0000 mg | ORAL_TABLET | Freq: Every day | ORAL | 3 refills | Status: DC

## 2022-03-24 ENCOUNTER — Encounter: Payer: Self-pay | Admitting: Family Medicine

## 2022-03-24 ENCOUNTER — Other Ambulatory Visit: Payer: Self-pay | Admitting: Family Medicine

## 2022-03-24 DIAGNOSIS — E1149 Type 2 diabetes mellitus with other diabetic neurological complication: Secondary | ICD-10-CM

## 2022-03-26 ENCOUNTER — Encounter: Payer: Self-pay | Admitting: Family Medicine

## 2022-04-14 ENCOUNTER — Other Ambulatory Visit: Payer: Self-pay | Admitting: Family Medicine

## 2022-06-10 ENCOUNTER — Other Ambulatory Visit: Payer: Self-pay | Admitting: Family Medicine

## 2022-06-10 DIAGNOSIS — Z8739 Personal history of other diseases of the musculoskeletal system and connective tissue: Secondary | ICD-10-CM

## 2022-06-10 DIAGNOSIS — E1149 Type 2 diabetes mellitus with other diabetic neurological complication: Secondary | ICD-10-CM

## 2022-07-15 ENCOUNTER — Telehealth (INDEPENDENT_AMBULATORY_CARE_PROVIDER_SITE_OTHER): Payer: 59 | Admitting: Family Medicine

## 2022-07-15 DIAGNOSIS — R197 Diarrhea, unspecified: Secondary | ICD-10-CM

## 2022-07-15 NOTE — Patient Instructions (Addendum)
---------------------------------------------------------------------------------------------------------------------------    WORK SLIP:  Patient Miguel Sanders,  11/01/1975, was seen for a medical visit today, 07/15/22 . Please excuse from work today for a medical visit. Patient will likely be most contagious until diarrhea and symptoms resolved for 24 hours.   Sincerely: E-signature: Dr. Colin Benton, DO [place] Ph: [phone]   ------------------------------------------------------------------------------------------------------------------------------  See below for information about diarrhea. Can continue the imodium. Continue to drink plenty of fluids and avoid dairy, sugary foods and red meat.  I hope you are feeling better soon!  Seek in person care promptly if your symptoms worsen, new concerns arise or you are not improving with treatment.  It was nice to meet you today. I help [facility] out with telemedicine visits on Tuesdays and Thursdays and am happy to help if you need a virtual follow up visit on those days. Otherwise, if you have any concerns or questions following this visit please schedule a follow up visit with your Primary Care office or seek care at a local urgent care clinic to avoid delays in care. If you are having severe or life threatening symptoms please call 911 and/or go to the nearest emergency room.       Diarrhea, Adult Diarrhea is when you pass loose and sometimes watery poop (stool) often. Diarrhea can make you feel weak and cause you to lose water in your body (get dehydrated). Losing water in your body can cause you to: Feel tired and thirsty. Have a dry mouth. Go pee (urinate) less often. Diarrhea often lasts 3-5 days. It can sometimes last longer. Be sure to treat your diarrhea as told by your doctor. Follow these instructions at home: Eating and drinking     Follow these instructions as told by your  doctor: If you are not eating, Take an ORS (oral rehydration solution). This is a drink that helps you replace fluids and minerals your body lost. It is sold at pharmacies and stores. Drink enough fluid to keep your pee (urine) pale yellow. Drink fluids such as: Water. You can also get fluids by sucking on ice chips. Diluted fruit juice. Low-calorie sports drinks.  Avoid drinking fluids that have a lot of sugar or caffeine in them. These include soda, energy drinks, and regular sports drinks. Avoid alcohol. Eat bland, easy-to-digest foods in small amounts as you are able. These foods include: Bananas. Applesauce. Rice. Low-fat (lean) meats. Toast. Crackers. Avoid spicy or fatty foods. Avoid Dairy Products and [employer].   Medicines Take over-the-counter and prescription medicines only as told by your doctor. If you were prescribed antibiotics, take them as told by your doctor. Do not stop taking them even if you start to feel better. General instructions  Wash your hands often using soap and water for 20 seconds. If soap and water are not available, use hand sanitizer. Others in your home should wash their hands as well. Wash your hands: After using the toilet or changing a diaper. Before preparing, cooking, or serving food. While caring for a sick person. While visiting someone in a hospital. Rest at home while you get better. Take a warm bath to help with any burning or pain from having diarrhea. Watch your condition for any changes. Contact a doctor if: You have a fever. Your diarrhea gets worse. You have new symptoms. You vomit every time you eat or drink. You feel light-headed, dizzy, or you have a headache. You have muscle cramps. You have signs of  losing too much water in your body, such as: Dark pee, very little pee, or no pee. Cracked lips. Dry mouth. Sunken eyes. Sleepiness. Weakness. You have bloody or black poop or poop that looks like tar. You have very bad  pain, cramping, or bloating in your belly (abdomen). Your skin feels cold and clammy. You feel confused. Get help right away if: You have chest pain. Your heart is beating very quickly. You have trouble breathing or you are breathing very quickly. You feel very weak or you faint. These symptoms may be an emergency. Get help right away. Call 911. Do not wait to see if the symptoms will go away. Do not drive yourself to the hospital. This information is not intended to replace advice given to you by your health care provider. Make sure you discuss any questions you have with your health care provider. Document Revised: 01/12/2022 Document Reviewed: 01/12/2022 Elsevier Patient Education  [place].

## 2022-07-15 NOTE — Progress Notes (Signed)
Virtual Visit via Video Note  I connected with Miguel Sanders  on 07/15/22 at  1:20 PM EST by a video enabled telemedicine application and verified that I am speaking with the correct person using two identifiers.  Location patient: [name] Location provider:work or home office Persons participating in the virtual visit: patient, provider  I discussed the limitations and requested verbal permission for telemedicine visit. The patient expressed understanding and agreed to proceed.   HPI:  Acute telemedicine visit for Diarrhea: -Onset: 3 days ago -Symptoms include: watery diarrhea, up to 3 times per day, a little nausea -wife had some symptoms too -drinking a lot of fluids -Denies: fever,malaise, abd pain, melena, hematochezia, vomiting, recent abx or travel, new things in the diet -Has tried: imodium, gasx -Pertinent past medical history: see below -Pertinent medication allergies:No Known Allergies -COVID-19 vaccine status:  Immunization History  Administered Date(s) Administered   Tdap 05/01/2018     ROS: See pertinent positives and negatives per HPI.  Past Medical History:  Diagnosis Date   Diabetes mellitus without complication (HCC)    Gout    Hyperlipidemia    Obesity, Class III, BMI 40-49.9 (morbid obesity) ([name])     Past Surgical History:  Procedure Laterality Date   BACK SURGERY     SPINE SURGERY       Current Outpatient Medications:    allopurinol (ZYLOPRIM) 100 MG tablet, TAKE 1 TABLET(100 MG) BY MOUTH DAILY, Disp: 90 tablet, Rfl: 1   blood glucose meter kit and supplies KIT, Dispense based on patient and insurance preference. Use up to four times daily as directed., Disp: 1 each, Rfl: 0   Insulin Glargine (BASAGLAR KWIKPEN) 100 UNIT/ML, ADMINISTER 18 UNITS UNDER THE SKIN AT BEDTIME, Disp: 15 mL, Rfl: 1   Insulin Pen Needle (B-D UF III MINI PEN NEEDLES) 31G X 5 MM MISC, USE WITH SAXENDA DAILY AS DIRECTED, Disp: 100 each, Rfl: 3   MITIGARE 0.6 MG CAPS, TAKE 1 TABLET BY  MOUTH DAILY AS NEEDED. HOLD ATORVASTAIN WHEN TAKING THIS MEDICATION, Disp: 30 capsule, Rfl: 3   Olopatadine HCl (PAZEO) 0.7 % SOLN, Apply 1 drop to eye daily., Disp: 2.5 mL, Rfl: 2   ondansetron (ZOFRAN-ODT) 4 MG disintegrating tablet, Take 1 tablet (4 mg total) by mouth every 8 (eight) hours as needed for nausea or vomiting., Disp: 20 tablet, Rfl: 0   rosuvastatin (CRESTOR) 20 MG tablet, Take 1 tablet (20 mg total) by mouth daily., Disp: 90 tablet, Rfl: 3   VICTOZA 18 MG/3ML SOPN, ADMINISTER 1.8 MG UNDER THE SKIN DAILY, Disp: 9 mL, Rfl: 3  EXAM:  VITALS per patient if applicable: denies fever  GENERAL: alert, oriented, appears well and in no acute distress  HEENT: atraumatic, conjunttiva clear, no obvious abnormalities on inspection of external nose and ears  NECK: normal movements of the head and neck  LUNGS: on inspection no signs of respiratory distress, breathing rate appears normal, no obvious gross SOB, gasping or wheezing  ABD: he pushes on his abdomen and denies pain or tenderness  CV: no obvious cyanosis  MS: moves all visible extremities without noticeable abnormality  PSYCH/NEURO: pleasant and cooperative, no obvious depression or anxiety, speech and thought processing grossly intact  ASSESSMENT AND PLAN:  Discussed the following assessment and plan:  Diarrhea, unspecified type  -we discussed possible serious and likely etiologies, options for evaluation and workup, limitations of telemedicine visit vs in person visit, treatment, treatment risks and precautions. Pt is agreeable to treatment via telemedicine at this moment.  Suspect infectious etiology vs other. He has opted to try oral hydration, continue imodium (discussed dosing), avoid dairy/red meat/sugary product.  Work/School slipped offered: provided in patient instruction Advised to seek prompt virtual visit or in person care if worsening, new symptoms arise, or if is not improving with treatment  over the next  few days.  Discussed options for follow up care. Did let this patient know that I do telemedicine on Tuesdays and Thursdays for [facility] and those are the days I am logged into the system. Advised to schedule follow up visit with PCP, [facility] virtual visits or [facility] if any further questions or concerns to avoid delays in care.   I discussed the assessment and treatment plan with the patient. The patient was provided an opportunity to ask questions and all were answered. The patient agreed with the plan and demonstrated an understanding of the instructions.     Miguel Kern, DO

## 2022-07-16 ENCOUNTER — Encounter: Payer: Self-pay | Admitting: Internal Medicine

## 2022-08-05 ENCOUNTER — Ambulatory Visit (AMBULATORY_SURGERY_CENTER): Payer: 59 | Admitting: *Deleted

## 2022-08-05 VITALS — Ht 74.0 in | Wt 360.0 lb

## 2022-08-05 DIAGNOSIS — Z1211 Encounter for screening for malignant neoplasm of colon: Secondary | ICD-10-CM

## 2022-08-05 DIAGNOSIS — Z800 Family history of malignant neoplasm of digestive organs: Secondary | ICD-10-CM

## 2022-08-05 MED ORDER — NA SULFATE-K SULFATE-MG SULF 17.5-3.13-1.6 GM/177ML PO SOLN: 1.0000 | Freq: Once | ORAL | 0 refills | Status: AC

## 2022-08-05 NOTE — Progress Notes (Signed)

## 2022-08-19 ENCOUNTER — Encounter: Payer: Self-pay | Admitting: Certified Registered Nurse Anesthetist

## 2022-08-19 ENCOUNTER — Encounter: Payer: Self-pay | Admitting: Internal Medicine

## 2022-08-20 ENCOUNTER — Encounter: Payer: Self-pay | Admitting: Family Medicine

## 2022-08-20 DIAGNOSIS — E1149 Type 2 diabetes mellitus with other diabetic neurological complication: Secondary | ICD-10-CM

## 2022-08-20 MED ORDER — VICTOZA 18 MG/3ML ~~LOC~~ SOPN: PEN_INJECTOR | SUBCUTANEOUS | 3 refills | Status: DC

## 2022-08-23 ENCOUNTER — Ambulatory Visit (AMBULATORY_SURGERY_CENTER): Payer: 59 | Admitting: Internal Medicine

## 2022-08-23 ENCOUNTER — Other Ambulatory Visit (HOSPITAL_COMMUNITY): Payer: Self-pay

## 2022-08-23 ENCOUNTER — Encounter: Payer: Self-pay | Admitting: Family Medicine

## 2022-08-23 ENCOUNTER — Encounter: Payer: Self-pay | Admitting: Internal Medicine

## 2022-08-23 ENCOUNTER — Telehealth: Payer: Self-pay | Admitting: Family Medicine

## 2022-08-23 ENCOUNTER — Other Ambulatory Visit: Payer: Self-pay

## 2022-08-23 VITALS — BP 124/83 | HR 69 | Temp 97.7°F | Resp 12 | Ht 74.0 in | Wt 360.0 lb

## 2022-08-23 DIAGNOSIS — Z1211 Encounter for screening for malignant neoplasm of colon: Secondary | ICD-10-CM

## 2022-08-23 DIAGNOSIS — D124 Benign neoplasm of descending colon: Secondary | ICD-10-CM

## 2022-08-23 DIAGNOSIS — D125 Benign neoplasm of sigmoid colon: Secondary | ICD-10-CM

## 2022-08-23 DIAGNOSIS — E1149 Type 2 diabetes mellitus with other diabetic neurological complication: Secondary | ICD-10-CM

## 2022-08-23 DIAGNOSIS — D128 Benign neoplasm of rectum: Secondary | ICD-10-CM

## 2022-08-23 DIAGNOSIS — D127 Benign neoplasm of rectosigmoid junction: Secondary | ICD-10-CM

## 2022-08-23 MED ORDER — VICTOZA 18 MG/3ML ~~LOC~~ SOPN: PEN_INJECTOR | SUBCUTANEOUS | 3 refills | Status: DC

## 2022-08-23 MED ORDER — SODIUM CHLORIDE 0.9 % IV SOLN: 500.0000 mL | Freq: Once | INTRAVENOUS | Status: DC

## 2022-08-23 NOTE — Progress Notes (Signed)
[facility] History and Physical   Primary Care Physician:  Martinique, Betty G, MD   Reason for Procedure:   CRCA screening   Plan:    colonoscopy     HPI: Miguel Sanders is a 47 y.o. male here for a screening exam today.  Bs 33 - not symptomatic   Past Medical History:  Diagnosis Date   Diabetes mellitus without complication (HCC)    GERD (gastroesophageal reflux disease)    Gout    Hyperlipidemia    Hypertension    MO MED'S   Obesity, Class III, BMI 40-49.9 (morbid obesity) (HCC)    Sleep apnea    NO C PAP    Past Surgical History:  Procedure Laterality Date   BACK SURGERY     SPINE SURGERY      Prior to Admission medications   Medication Sig Start Date End Date Taking? Authorizing Provider  allopurinol (ZYLOPRIM) 100 MG tablet TAKE 1 TABLET(100 MG) BY MOUTH DAILY 06/11/22  Yes Martinique, Betty G, MD  Insulin Glargine [facility]) 100 UNIT/ML ADMINISTER 18 UNITS UNDER THE SKIN AT BEDTIME 06/11/22  Yes Martinique, Betty G, MD  Insulin Pen Needle (B-D UF III MINI PEN NEEDLES) 31G X 5 MM MISC USE WITH SAXENDA DAILY AS DIRECTED 04/14/22  Yes Martinique, Betty G, MD  rosuvastatin (CRESTOR) 20 MG tablet Take 1 tablet (20 mg total) by mouth daily. 02/08/22  Yes Martinique, Betty G, MD  blood glucose meter kit and supplies KIT Dispense based on patient and insurance preference. Use up to four times daily as directed. Patient not taking: Reported on 08/05/2022 01/12/22   Isla Pence, MD  liraglutide (VICTOZA) 18 MG/3ML SOPN Administer 1.8 mg under the skin daily. 08/20/22   Martinique, Betty G, MD  MITIGARE 0.6 MG CAPS TAKE 1 TABLET BY MOUTH DAILY AS NEEDED. HOLD ATORVASTAIN WHEN TAKING THIS MEDICATION Patient not taking: Reported on 08/05/2022 02/07/18   Martinique, Betty G, MD    Current Outpatient Medications  Medication Sig Dispense Refill   allopurinol (ZYLOPRIM) 100 MG tablet TAKE 1 TABLET(100 MG) BY MOUTH DAILY 90 tablet 1   Insulin Glargine (BASAGLAR KWIKPEN) 100 UNIT/ML  ADMINISTER 18 UNITS UNDER THE SKIN AT BEDTIME 15 mL 1   Insulin Pen Needle (B-D UF III MINI PEN NEEDLES) 31G X 5 MM MISC USE WITH SAXENDA DAILY AS DIRECTED 100 each 3   rosuvastatin (CRESTOR) 20 MG tablet Take 1 tablet (20 mg total) by mouth daily. 90 tablet 3   blood glucose meter kit and supplies KIT Dispense based on patient and insurance preference. Use up to four times daily as directed. (Patient not taking: Reported on 08/05/2022) 1 each 0   liraglutide (VICTOZA) 18 MG/3ML SOPN Administer 1.8 mg under the skin daily. 9 mL 3   MITIGARE 0.6 MG CAPS TAKE 1 TABLET BY MOUTH DAILY AS NEEDED. HOLD ATORVASTAIN WHEN TAKING THIS MEDICATION (Patient not taking: Reported on 08/05/2022) 30 capsule 3   Current Facility-Administered Medications  Medication Dose Route Frequency Provider Last Rate Last Admin   0.9 %  sodium chloride infusion  500 mL Intravenous Once Gatha Mayer, MD        Allergies as of 08/23/2022   (No Known Allergies)    Family History  Problem Relation Age of Onset   Arthritis Mother    Colon cancer Maternal Grandfather 52   Cancer Other    Hypertension Other    Colon polyps Neg Hx    Crohn's disease Neg Hx  Esophageal cancer Neg Hx    Rectal cancer Neg Hx    Stomach cancer Neg Hx    Ulcerative colitis Neg Hx     Social History   Socioeconomic History   Marital status: Married    Spouse name: Not on file   Number of children: 2   Years of education: Not on file   Highest education level: Not on file  Occupational History   Occupation: unemployed  Tobacco Use   Smoking status: Former    Packs/day: 1.50    Years: 10.00    Total pack years: 15.00    Types: Cigarettes    Quit date: 08/09/2009    Years since quitting: 13.0    Passive exposure: Past   Smokeless tobacco: Never  Vaping Use   Vaping Use: Never used  Substance and Sexual Activity   Alcohol use: Yes    Comment: OCCASSIONALY   Drug use: No   Sexual activity: Yes  Other Topics Concern   Not  on file  Social History Narrative   Marital status: married x 5 years. Separated from wife in 08/2012.     Children: 2 children from previous marriage; gets children every two weeks; sees more frequently.     Lives: alone; separated from wife in 08/2012.     Employment: Glass blower/designer.      Tobacco: quit; smoked x 13 years.      Alcohol: once every three months.      Drugs: none now; marijuana in past.      Exercise: started going to gym in 08/2012; going 4-5 days per week; elliptical and stationary bike.            Social Determinants of Health   Financial Resource Strain: Not on file  Food Insecurity: Not on file  Transportation Needs: Not on file  Physical Activity: Not on file  Stress: Not on file  Social Connections: Not on file  Intimate Partner Violence: Not on file    Review of Systems:  All other review of systems negative except as mentioned in the HPI.  Physical Exam: Vital signs BP (!) 148/71   Pulse 80   Temp 97.7 F (36.5 C)   Ht '6\' 2"'$  (1.88 m)   Wt (!) 360 lb (163.3 kg)   SpO2 97%   BMI 46.22 kg/m   General:   Alert,  Well-developed, well-nourished, pleasant and cooperative in NAD Lungs:  Clear throughout to auscultation.   Heart:  Regular rate and rhythm; no murmurs, clicks, rubs,  or gallops. Abdomen:  Soft, nontender and nondistended. Normal bowel sounds.   Neuro/Psych:  Alert and cooperative. Normal mood and affect. A and O x 3   '@Neri Vieyra'$  Simonne Maffucci, MD, [facility] [phone] (pager) 08/23/2022 7:57 AM@

## 2022-08-23 NOTE — Telephone Encounter (Signed)
Spoke with insurance, Rx is approved for 6 mls per 30 days. Pharmacy notified.

## 2022-08-23 NOTE — Op Note (Signed)
[facility] Patient Name: Miguel Sanders Procedure Date: 08/23/2022 7:08 AM MRN: [id number] Endoscopist: Gatha Mayer , MD, [id number] Age: 47 Referring MD:  Date of Birth: Jan 11, 1976 Gender: Male Account #: [id number] Procedure:                Colonoscopy Indications:              Screening for colorectal malignant neoplasm, This                            is the patient's first colonoscopy Medicines:                Monitored Anesthesia Care Procedure:                Pre-Anesthesia Assessment:                           - Prior to the procedure, a History and Physical                            was performed, and patient medications and                            allergies were reviewed. The patient's tolerance of                            previous anesthesia was also reviewed. The risks                            and benefits of the procedure and the sedation                            options and risks were discussed with the patient.                            All questions were answered, and informed consent                            was obtained. Prior Anticoagulants: The patient has                            taken no anticoagulant or antiplatelet agents. ASA                            Grade Assessment: III - A patient with severe                            systemic disease. After reviewing the risks and                            benefits, the patient was deemed in satisfactory                            condition to undergo the procedure.  After obtaining informed consent, the colonoscope                            was passed under direct vision. Throughout the                            procedure, the patient's blood pressure, pulse, and                            oxygen saturations were monitored continuously. The                            Olympus CF-HQ190L [id number]) Colonoscope was                            introduced through the  anus and advanced to the the                            cecum, identified by appendiceal orifice and                            ileocecal valve. The colonoscopy was somewhat                            difficult due to significant looping. Successful                            completion of the procedure was aided by changing                            the patient's position and applying abdominal                            pressure. The patient tolerated the procedure well.                            The quality of the bowel preparation was good. The                            ileocecal valve, appendiceal orifice, and rectum                            were photographed. The bowel preparation used was                            SUPREP via split dose instruction. Scope In: 8:08:24 AM Scope Out: 8:30:32 AM Scope Withdrawal Time: 0 hours 17 minutes 39 seconds  Total Procedure Duration: 0 hours 22 minutes 8 seconds  Findings:                 The perianal and digital rectal examinations were                            normal. Pertinent negatives include normal prostate                            (  size, shape, and consistency).                           An 8 mm polyp was found in the rectum. The polyp                            was semi-pedunculated. The polyp was removed with a                            hot snare. Resection and retrieval were complete.                            Verification of patient identification for the                            specimen was done. Estimated blood loss: none.                           Two sessile polyps were found in the sigmoid colon                            and descending colon. The polyps were diminutive in                            size. These polyps were removed with a cold snare.                            Resection and retrieval were complete. Verification                            of patient identification for the specimen was                             done. Estimated blood loss was minimal.                           A localized area of altered vascular mucosa was                            found at the ileocecal valve.                           The exam was otherwise without abnormality on                            direct and retroflexion views. Complications:            No immediate complications. Estimated Blood Loss:     Estimated blood loss was minimal. Impression:               - One 8 mm polyp in the rectum, removed with a hot                            snare. Resected and retrieved.                           -  Two diminutive polyps in the sigmoid colon and in                            the descending colon, removed with a cold snare.                            Resected and retrieved.                           - Altered vascular mucosa at the ileocecal valve. ?                            atypical AVM                           - The examination was otherwise normal on direct                            and retroflexion views. Recommendation:           - Patient has a contact number available for                            emergencies. The signs and symptoms of potential                            delayed complications were discussed with the                            patient. Return to normal activities tomorrow.                            Written discharge instructions were provided to the                            patient.                           - Resume previous diet.                           - Continue present medications.                           - Await pathology results.                           - Repeat colonoscopy is recommended. The                            colonoscopy date will be determined after pathology                            results from today's exam become available for  review.                           - No aspirin, ibuprofen, naproxen, or other                             non-steroidal anti-inflammatory drugs for 2 weeks                            after polyp removal. Gatha Mayer, MD 08/23/2022 8:40:00 AM This report has been signed electronically.

## 2022-08-23 NOTE — Telephone Encounter (Signed)
[place] with [facility]  - [phone]    liraglutide (VICTOZA) 18 MG/3ML SOPN   Asking if Pt has tried 6 mls a month at first?

## 2022-08-23 NOTE — Progress Notes (Signed)
No changes in his Medical surgical history since is pre visit. CBG was 345. Dr. Carlean Purl and Elizabeth Palau CRNA notified. Pt did not take his insulin last night and has been out of his Victoza for a week.

## 2022-08-23 NOTE — Progress Notes (Signed)
Report given to PACU, vss 

## 2022-08-23 NOTE — Patient Instructions (Addendum)
I found and removed 3 small polyps today. All look benign. I will let you know pathology results and when to have another routine colonoscopy by mail and/or My Chart.  I appreciate the opportunity to care for you. Gatha Mayer, MD, FACG      YOU HAD AN ENDOSCOPIC PROCEDURE TODAY AT [facility]:   Refer to the procedure report that was given to you for any specific questions about what was found during the examination.  If the procedure report does not answer your questions, please call your gastroenterologist to clarify.  If you requested that your care partner not be given the details of your procedure findings, then the procedure report has been included in a sealed envelope for you to review at your convenience later.  YOU SHOULD EXPECT: Some feelings of bloating in the abdomen. Passage of more gas than usual.  Walking can help get rid of the air that was put into your GI tract during the procedure and reduce the bloating. If you had a lower endoscopy (such as a colonoscopy or flexible sigmoidoscopy) you may notice spotting of blood in your stool or on the toilet paper. If you underwent a bowel prep for your procedure, you may not have a normal bowel movement for a few days.  Please Note:  You might notice some irritation and congestion in your nose or some drainage.  This is from the oxygen used during your procedure.  There is no need for concern and it should clear up in a day or so.  SYMPTOMS TO REPORT IMMEDIATELY:  Following lower endoscopy (colonoscopy or flexible sigmoidoscopy):  Excessive amounts of blood in the stool  Significant tenderness or worsening of abdominal pains  Swelling of the abdomen that is new, acute  Fever of 100F or higher  For urgent or emergent issues, a gastroenterologist can be reached at any hour by calling [phone]. Do not use MyChart messaging for urgent concerns.    DIET:  We do recommend a small meal at first, but then you  may proceed to your regular diet.  Drink plenty of fluids but you should avoid alcoholic beverages for 24 hours.  ACTIVITY:  You should plan to take it easy for the rest of today and you should NOT DRIVE or use heavy machinery until tomorrow (because of the sedation medicines used during the test).    FOLLOW UP: Our staff will call the number listed on your records the next business day following your procedure.  We will call around 7:15- 8:00 am to check on you and address any questions or concerns that you may have regarding the information given to you following your procedure. If we do not reach you, we will leave a message.     If any biopsies were taken you will be contacted by phone or by letter within the next 1-3 weeks.  Please call us at [phone] if you have not heard about the biopsies in 3 weeks.   SIGNATURES/CONFIDENTIALITY: You and/or your care partner have signed paperwork which will be entered into your electronic medical record.  These signatures attest to the fact that that the information above on your After Visit Summary has been reviewed and is understood.  Full responsibility of the confidentiality of this discharge information lies with you and/or your care-partner.

## 2022-08-24 ENCOUNTER — Telehealth: Payer: Self-pay | Admitting: *Deleted

## 2022-08-24 NOTE — Telephone Encounter (Signed)
  Follow up Call-     08/23/2022    7:20 AM  Call back number  Post procedure Call Back phone  # [phone]  Permission to leave phone message Yes     Patient questions:  Do you have a fever, pain , or abdominal swelling? No. Pain Score  0 *  Have you tolerated food without any problems? Yes.    Have you been able to return to your normal activities? Yes.    Do you have any questions about your discharge instructions: Diet   No. Medications  No. Follow up visit  No.  Do you have questions or concerns about your Care? No.  Actions: * If pain score is 4 or above: No action needed, pain <4.

## 2022-08-27 NOTE — Progress Notes (Unsigned)
HPI: Miguel Sanders is a 47 y.o. male, who is here today for chronic disease management.  Last seen on 02/05/2022 He has been experiencing joint pain in his elbow, fingers, toes, and knees without swelling or redness, raising concerns about arthritis or gout.  He wonders if allopurinol dose can be increased. He has not identified exacerbating or alleviating factors for joint pain. Currently he is on allopurinol 100 mg daily.  Diabetes Mellitus II: Dx'ed in 2012.  - Checking BG at home: Not checking. He reports not having taken Victoza for the past two to three weeks due to insurance issues, which have led to the approval of only one pen instead of three at the same price.  Currently he is on Basaglar 18 units daily. Metformin caused diarrhea and Jardiance caused recurrent balanitis.  He mentions unintentional weight loss, despite of not following a healthful diet and reports that since stopping Victoza, he has been experiencing increased urination and thirst, and he has been consuming a significant amount of diet sodas.  He previously experienced burning and tingling in his feet, but this has improved after switching to diabetic socks. Negative for symptoms of hypoglycemia, polyuria, polydipsia, foot ulcers/trauma.  Lab Results  Component Value Date   HGBA1C 9.0 (H) 12/14/2021   Lab Results  Component Value Date   MICROALBUR 2.4 (H) 02/05/2022   Hyperlipidemia: Currently he is on rosuvastatin 20 mg daily. Lab Results  Component Value Date   CHOL 149 02/05/2022   HDL 28.70 (L) 02/05/2022   [place]  05/14/2020     Comment:     . LDL cholesterol not calculated. Triglyceride levels greater than 400 mg/dL invalidate calculated LDL results. . Reference range: <100 . Desirable range <100 mg/dL for primary prevention;   <70 mg/dL for patients with CHD or diabetic patients  with > or = 2 CHD risk factors. Marland Kitchen LDL-C is now calculated using the Martin-Hopkins  calculation,  which is a validated novel method providing  better accuracy than the Friedewald equation in the  estimation of LDL-C.  Cresenciano Genre et al. Annamaria Helling. 2951;884(16): 2061-2068  (http://education.[facility].com/faq/FAQ164)    LDLDIRECT 75.0 02/05/2022   TRIG 354.0 (H) 02/05/2022   CHOLHDL 5 02/05/2022   He is also experiencing insomnia, with difficulty falling asleep and "racing thoughts."  He has had this problem for a while but it seems to be getting worse. He has tried over-the-counter medications like ZzzQuil without success. He acknowledges having some mild depression at times.    08/30/2022    7:35 AM 02/05/2022    7:33 AM 12/14/2021   10:29 AM 10/08/2020    7:35 AM 09/02/2017    8:23 AM  Depression screen PHQ 2/9  Decreased Interest 0 0 0 0 2  Down, Depressed, Hopeless 0 0 0 0 1  PHQ - 2 Score 0 0 0 0 3  Altered sleeping 3    3  Tired, decreased energy 3    3  Change in appetite 2    2  Feeling bad or failure about yourself  0    2  Trouble concentrating 2    1  Moving slowly or fidgety/restless 0    1  Suicidal thoughts 0    0  PHQ-9 Score 10    15  Difficult doing work/chores Somewhat difficult       Review of Systems  Constitutional:  Positive for unexpected weight change. Negative for chills and fever.  HENT:  Negative for mouth sores and  sore throat.   Respiratory:  Negative for cough and wheezing.   Cardiovascular:  Negative for chest pain and palpitations.  Gastrointestinal:  Negative for abdominal pain, nausea and vomiting.  Skin:  Negative for rash.  Neurological:  Negative for syncope and headaches.  Psychiatric/Behavioral:  Negative for confusion and hallucinations.   See other pertinent positives and negatives in HPI.  Current Outpatient Medications on File Prior to Visit  Medication Sig Dispense Refill   allopurinol (ZYLOPRIM) 100 MG tablet TAKE 1 TABLET(100 MG) BY MOUTH DAILY 90 tablet 1   blood glucose meter kit and supplies KIT Dispense based on patient and  insurance preference. Use up to four times daily as directed. (Patient not taking: Reported on 08/05/2022) 1 each 0   Insulin Glargine (BASAGLAR KWIKPEN) 100 UNIT/ML ADMINISTER 18 UNITS UNDER THE SKIN AT BEDTIME 15 mL 1   Insulin Pen Needle (B-D UF III MINI PEN NEEDLES) 31G X 5 MM MISC USE WITH SAXENDA DAILY AS DIRECTED 100 each 3   liraglutide (VICTOZA) 18 MG/3ML SOPN Administer 1.8 mg under the skin daily. 6 mL 3   MITIGARE 0.6 MG CAPS TAKE 1 TABLET BY MOUTH DAILY AS NEEDED. HOLD ATORVASTAIN WHEN TAKING THIS MEDICATION (Patient not taking: Reported on 08/05/2022) 30 capsule 3   rosuvastatin (CRESTOR) 20 MG tablet Take 1 tablet (20 mg total) by mouth daily. 90 tablet 3   No current facility-administered medications on file prior to visit.    Past Medical History:  Diagnosis Date   Diabetes mellitus without complication (HCC)    GERD (gastroesophageal reflux disease)    Gout    Hyperlipidemia    Hypertension    MO MED'S   Obesity, Class III, BMI 40-49.9 (morbid obesity) (HCC)    Sleep apnea    NO C PAP   No Known Allergies  Social History   Socioeconomic History   Marital status: Married    Spouse name: Not on file   Number of children: 2   Years of education: Not on file   Highest education level: Not on file  Occupational History   Occupation: unemployed  Tobacco Use   Smoking status: Former    Packs/day: 1.50    Years: 10.00    Total pack years: 15.00    Types: Cigarettes    Quit date: 08/09/2009    Years since quitting: 13.0    Passive exposure: Past   Smokeless tobacco: Never  Vaping Use   Vaping Use: Never used  Substance and Sexual Activity   Alcohol use: Yes    Comment: OCCASSIONALY   Drug use: No   Sexual activity: Yes  Other Topics Concern   Not on file  Social History Narrative   Marital status: married x 5 years. Separated from wife in 08/2012.     Children: 2 children from previous marriage; gets children every two weeks; sees more frequently.      Lives: alone; separated from wife in 08/2012.     Employment: Glass blower/designer.      Tobacco: quit; smoked x 13 years.      Alcohol: once every three months.      Drugs: none now; marijuana in past.      Exercise: started going to gym in 08/2012; going 4-5 days per week; elliptical and stationary bike.            Social Determinants of Health   Financial Resource Strain: Not on file  Food Insecurity: Not on file  Transportation Needs: Not  on file  Physical Activity: Not on file  Stress: Not on file  Social Connections: Not on file   Today's Vitals   08/30/22 0728  BP: 128/80  Pulse: 98  Resp: 16  Temp: 98.7 F (37.1 C)  TempSrc: Oral  SpO2: 97%  Weight: (!) 352 lb 6 oz (159.8 kg)  Height: '6\' 2"'$  (1.88 m)   Wt Readings from Last 3 Encounters:  08/30/22 (!) 352 lb 6 oz (159.8 kg)  08/23/22 (!) 360 lb (163.3 kg)  08/05/22 (!) 360 lb (163.3 kg)  Body mass index is 45.24 kg/m.  Physical Exam Vitals and nursing note reviewed.  Constitutional:      General: He is not in acute distress.    Appearance: He is well-developed.  HENT:     Head: Normocephalic and atraumatic.     Mouth/Throat:     Mouth: Mucous membranes are moist.  Eyes:     Conjunctiva/sclera: Conjunctivae normal.  Cardiovascular:     Rate and Rhythm: Normal rate and regular rhythm.     Pulses:          Dorsalis pedis pulses are 2+ on the right side and 2+ on the left side.     Heart sounds: No murmur heard. Pulmonary:     Effort: Pulmonary effort is normal. No respiratory distress.     Breath sounds: Normal breath sounds.  Abdominal:     Palpations: Abdomen is soft. There is no hepatomegaly or mass.     Tenderness: There is no abdominal tenderness.  Lymphadenopathy:     Cervical: No cervical adenopathy.  Skin:    General: Skin is warm.     Findings: No erythema or rash.  Neurological:     Mental Status: He is alert and oriented to person, place, and time.     Cranial Nerves: No cranial nerve  deficit.     Gait: Gait normal.  Psychiatric:     Comments: Well groomed, good eye contact.   ASSESSMENT AND PLAN:  Mr. Darus was seen today for medical management of chronic issues.  Diagnoses and all orders for this visit: Lab Results  Component Value Date   HGBA1C 11.0 (A) 08/30/2022   Lab Results  Component Value Date   CREATININE 0.99 08/30/2022   BUN 14 08/30/2022   NA 134 (L) 08/30/2022   K 4.1 08/30/2022   CL 97 08/30/2022   CO2 26 08/30/2022   Lab Results  Component Value Date   ALT 28 08/30/2022   AST 16 08/30/2022   ALKPHOS 97 08/30/2022   BILITOT 0.4 08/30/2022   Lab Results  Component Value Date   MICROALBUR 25.3 (H) 08/30/2022   MICROALBUR 2.4 (H) 02/05/2022   Lab Results  Component Value Date   CHOL 156 08/30/2022   HDL 27.30 (L) 08/30/2022   [place]  05/14/2020     Comment:     . LDL cholesterol not calculated. Triglyceride levels greater than 400 mg/dL invalidate calculated LDL results. . Reference range: <100 . Desirable range <100 mg/dL for primary prevention;   <70 mg/dL for patients with CHD or diabetic patients  with > or = 2 CHD risk factors. Marland Kitchen LDL-C is now calculated using the Martin-Hopkins  calculation, which is a validated novel method providing  better accuracy than the Friedewald equation in the  estimation of LDL-C.  Cresenciano Genre et al. Annamaria Helling. 9528;413(24): 2061-2068  (http://education.[facility].com/faq/FAQ164)    LDLDIRECT 38.0 08/30/2022   TRIG (H) 08/30/2022    849.0 Triglyceride is  over 400; calculations on Lipids are invalid.   CHOLHDL 6 08/30/2022   Type 2 diabetes mellitus with other specified complication, with long-term current use of insulin (HCC) Assessment & Plan: Problem is not well controlled, HgA1C went from 9 to 11.0 Having problems with Victoza coverage. We discussed other pharmacologic options. Januvia 100 mg added today. Basaglar increased from 18 U to 25 U. We discussed some side effects of  medication, including the risk of hypoglycemic events. Appt with nutritionist will be arranged. Reviewed possible complications of elevated BS's. Annual eye exam is overdue.  F/U in 3-4 months  Orders: -     POCT glycosylated hemoglobin (Hb A1C) -     Basaglar KwikPen; Inject 25 Units into the skin daily.  Dispense: 15 mL; Refill: 1 -     Amb Referral to Nutrition and Diabetic Education -     SITagliptin Phosphate; Take 1 tablet (100 mg total) by mouth daily.  Dispense: 90 tablet; Refill: 1 -     Comprehensive metabolic panel; Future -     Microalbumin / creatinine urine ratio; Future  Hyperlipidemia associated with type 2 diabetes mellitus ([place]) Assessment & Plan: LDL was 75 in 01/2022. Continue Rosuvastatin 20 mg daily. Further recommendations according to FLP result.   Orders: -     Lipid panel; Future  Atherosclerosis of aorta (HCC) Assessment & Plan: Continue Rosuvastatin 20 mg daily. Further recommendations according to FLP results.   Gouty arthropathy Assessment & Plan: We discussed differential Dx's for polyarthralgia. He would like to try higher dose of Allopurinol,so increased from 100 mg to 200 mg daily. Continue low purine diet.  Orders: -     Allopurinol; Take 1 tablet (100 mg total) by mouth 2 (two) times daily.  Dispense: 180 tablet; Refill: 1 -     Uric acid, random urine; Future  Insomnia, unspecified type Assessment & Plan: OTC sleep aids have not helped. Trazodone 50 mg recommended 30 min before bedtime. Some side effects discussed. Good sleep hygiene also recommended.  Orders: -     traZODone HCl; Take 1 tablet (50 mg total) by mouth at bedtime.  Dispense: 90 tablet; Refill: 1 -     TSH; Future  Other orders -     LDL cholesterol, direct   I spent a total of 48 minutes in both face to face and non face to face activities for this visit on the date of this encounter. During this time history was obtained and documented, examination was  performed, prior labs reviewed, and assessment/plan discussed.  Return in about 14 weeks (around 12/06/2022) for chronic problems.  Leston Schueller G. [place], MD  [facility]. [place] office.

## 2022-08-30 ENCOUNTER — Ambulatory Visit: Payer: 59 | Admitting: Family Medicine

## 2022-08-30 ENCOUNTER — Encounter: Payer: Self-pay | Admitting: Family Medicine

## 2022-08-30 VITALS — BP 128/80 | HR 98 | Temp 98.7°F | Resp 16 | Ht 74.0 in | Wt 352.4 lb

## 2022-08-30 DIAGNOSIS — G4700 Insomnia, unspecified: Secondary | ICD-10-CM

## 2022-08-30 DIAGNOSIS — I700 Atherosclerosis of aorta: Secondary | ICD-10-CM | POA: Diagnosis not present

## 2022-08-30 DIAGNOSIS — E785 Hyperlipidemia, unspecified: Secondary | ICD-10-CM | POA: Diagnosis not present

## 2022-08-30 DIAGNOSIS — E1149 Type 2 diabetes mellitus with other diabetic neurological complication: Secondary | ICD-10-CM

## 2022-08-30 DIAGNOSIS — Z794 Long term (current) use of insulin: Secondary | ICD-10-CM | POA: Diagnosis not present

## 2022-08-30 DIAGNOSIS — M109 Gout, unspecified: Secondary | ICD-10-CM | POA: Diagnosis not present

## 2022-08-30 DIAGNOSIS — E1169 Type 2 diabetes mellitus with other specified complication: Secondary | ICD-10-CM | POA: Diagnosis not present

## 2022-08-30 LAB — COMPREHENSIVE METABOLIC PANEL
ALT: 28 U/L (ref 0–53)
AST: 16 U/L (ref 0–37)
Albumin: 4.1 g/dL (ref 3.5–5.2)
Alkaline Phosphatase: 97 U/L (ref 39–117)
BUN: 14 mg/dL (ref 6–23)
CO2: 26 mEq/L (ref 19–32)
Calcium: 10 mg/dL (ref 8.4–10.5)
Chloride: 97 mEq/L (ref 96–112)
Creatinine, Ser: 0.99 mg/dL (ref 0.40–1.50)
GFR: 92.05 mL/min (ref >60.00)
Glucose, Bld: 340 mg/dL — ABNORMAL HIGH (ref 70–99)
Potassium: 4.1 mEq/L (ref 3.5–5.1)
Sodium: 134 mEq/L — ABNORMAL LOW (ref 135–145)
Total Bilirubin: 0.4 mg/dL (ref 0.2–1.2)
Total Protein: 7.6 g/dL (ref 6.0–8.3)

## 2022-08-30 LAB — POCT GLYCOSYLATED HEMOGLOBIN (HGB A1C): Hemoglobin A1C: 11 % — AB (ref 4.0–5.6)

## 2022-08-30 LAB — LIPID PANEL
Cholesterol: 156 mg/dL (ref 0–200)
HDL: 27.3 mg/dL — ABNORMAL LOW (ref >39.00)
Total CHOL/HDL Ratio: 6
Triglycerides: 849 mg/dL — ABNORMAL HIGH (ref 0.0–149.0)

## 2022-08-30 LAB — MICROALBUMIN / CREATININE URINE RATIO
Creatinine,U: 112.8 mg/dL
Microalb Creat Ratio: 22.5 mg/g (ref 0.0–30.0)
Microalb, Ur: 25.3 mg/dL — ABNORMAL HIGH (ref 0.0–1.9)

## 2022-08-30 LAB — LDL CHOLESTEROL, DIRECT: Direct LDL: 38 mg/dL

## 2022-08-30 LAB — TSH: TSH: 1.76 u[IU]/mL (ref 0.35–5.50)

## 2022-08-30 MED ORDER — TRAZODONE HCL 50 MG PO TABS: 50.0000 mg | ORAL_TABLET | Freq: Every day | ORAL | 1 refills | Status: DC

## 2022-08-30 MED ORDER — ALLOPURINOL 100 MG PO TABS: 100.0000 mg | ORAL_TABLET | Freq: Two times a day (BID) | ORAL | 1 refills | Status: DC

## 2022-08-30 MED ORDER — BASAGLAR KWIKPEN 100 UNIT/ML ~~LOC~~ SOPN: 25.0000 [IU] | PEN_INJECTOR | Freq: Every day | SUBCUTANEOUS | 1 refills | Status: DC

## 2022-08-30 MED ORDER — SITAGLIPTIN PHOSPHATE 100 MG PO TABS: 100.0000 mg | ORAL_TABLET | Freq: Every day | ORAL | 1 refills | Status: DC

## 2022-08-30 NOTE — Assessment & Plan Note (Signed)
We discussed differential Dx's for polyarthralgia. He would like to try higher dose of Allopurinol,so increased from 100 mg to 200 mg daily. Continue low purine diet.

## 2022-08-30 NOTE — Patient Instructions (Addendum)
A few things to remember from today's visit:  Type II diabetes mellitus with neurological manifestations ([place]) - Plan: POC HgB A1c, Insulin Glargine (BASAGLAR KWIKPEN) 100 UNIT/ML, Amb Referral to Nutrition and Diabetic Education, sitaGLIPtin (JANUVIA) 100 MG tablet, Comprehensive metabolic panel, Microalbumin / creatinine urine ratio  Hyperlipidemia associated with type 2 diabetes mellitus ([place]) - Plan: Lipid panel  Atherosclerosis of aorta (HCC), Chronic  Gouty arthropathy - Plan: allopurinol (ZYLOPRIM) 100 MG tablet, Uric acid, random urine  History of acute gouty arthritis  Insomnia, unspecified type - Plan: traZODone (DESYREL) 50 MG tablet, TSH  Januvia 100 mg added today. Basaglar increased to 25 U daily. Trazodone added for sleep, take it 30 min before bedtime.  If you need refills for medications you take chronically, please call your pharmacy. Do not use My Chart to request refills or for acute issues that need immediate attention. If you send a my chart message, it may take a few days to be addressed, specially if I am not in the office.  Please be sure medication list is accurate. If a new problem present, please set up appointment sooner than planned today.

## 2022-08-30 NOTE — Assessment & Plan Note (Signed)
LDL was 75 in 01/2022. Continue Rosuvastatin 20 mg daily. Further recommendations according to FLP result.

## 2022-08-30 NOTE — Assessment & Plan Note (Signed)
Continue Rosuvastatin 20 mg daily. Further recommendations according to FLP results.

## 2022-08-30 NOTE — Assessment & Plan Note (Signed)
OTC sleep aids have not helped. Trazodone 50 mg recommended 30 min before bedtime. Some side effects discussed. Good sleep hygiene also recommended.

## 2022-08-30 NOTE — Assessment & Plan Note (Signed)
Problem is not well controlled, HgA1C went from 9 to 11.0 Having problems with Victoza coverage. We discussed other pharmacologic options. Januvia 100 mg added today. Basaglar increased from 18 U to 25 U. We discussed some side effects of medication, including the risk of hypoglycemic events. Appt with nutritionist will be arranged. Reviewed possible complications of elevated BS's. Annual eye exam is overdue.  F/U in 3-4 months

## 2022-08-31 ENCOUNTER — Encounter: Payer: Self-pay | Admitting: Internal Medicine

## 2022-08-31 DIAGNOSIS — Z860101 Personal history of adenomatous and serrated colon polyps: Secondary | ICD-10-CM

## 2022-08-31 DIAGNOSIS — Z86010 Personal history of colonic polyps: Secondary | ICD-10-CM | POA: Insufficient documentation

## 2022-08-31 HISTORY — DX: Personal history of adenomatous and serrated colon polyps: Z86.0101

## 2022-08-31 LAB — URIC ACID, RANDOM URINE: Uric Acid, Urine: 31 mg/dL

## 2022-09-01 ENCOUNTER — Encounter: Payer: Self-pay | Admitting: Family Medicine

## 2022-09-01 ENCOUNTER — Telehealth: Payer: Self-pay

## 2022-09-01 NOTE — Telephone Encounter (Signed)
Januvia is not covered, they will cover Tradjenta.

## 2022-09-02 ENCOUNTER — Other Ambulatory Visit: Payer: Self-pay | Admitting: Family Medicine

## 2022-09-02 DIAGNOSIS — E781 Pure hyperglyceridemia: Secondary | ICD-10-CM

## 2022-09-02 DIAGNOSIS — M109 Gout, unspecified: Secondary | ICD-10-CM

## 2022-09-02 MED ORDER — GEMFIBROZIL 600 MG PO TABS: 600.0000 mg | ORAL_TABLET | Freq: Two times a day (BID) | ORAL | 1 refills | Status: DC

## 2022-09-03 MED ORDER — LINAGLIPTIN 5 MG PO TABS: 5.0000 mg | ORAL_TABLET | Freq: Every day | ORAL | 3 refills | Status: DC

## 2022-09-03 NOTE — Telephone Encounter (Signed)
Rx for Tradjenta sent in.

## 2022-09-03 NOTE — Addendum Note (Signed)
Addended by: Rodrigo Ran on: 09/03/2022 07:51 AM   Modules accepted: Orders

## 2022-09-08 ENCOUNTER — Encounter: Payer: Self-pay | Admitting: Family Medicine

## 2022-09-08 ENCOUNTER — Ambulatory Visit: Payer: 59 | Admitting: Family Medicine

## 2022-09-08 VITALS — BP 138/76 | HR 78 | Temp 98.3°F | Ht 74.0 in | Wt 355.6 lb

## 2022-09-08 DIAGNOSIS — B372 Candidiasis of skin and nail: Secondary | ICD-10-CM

## 2022-09-08 MED ORDER — FLUCONAZOLE 100 MG PO TABS: 100.0000 mg | ORAL_TABLET | Freq: Every day | ORAL | 0 refills | Status: DC

## 2022-09-08 NOTE — Progress Notes (Signed)
Established Patient Office Visit  Subjective   Patient ID: Miguel Sanders, male    DOB: Jun 19, 1976  Age: 47 y.o. MRN: [id number]  Chief Complaint  Patient presents with   Yeast infection    X4 days    HPI   Miguel Sanders seen as a work in with concern for recurrent yeast infection of the glans and shaft of the penis.  He had similar infection in the past.  He has type 2 diabetes with recent poor control with recent A1c 11.0%.  No longer takes SGLT2 medication.  He is currently on Basaglar 25 units daily and Tradjenta 5 mg daily.  His predinner blood sugar last night was 372 and before bedtime this was down to 199 after taking 25 units of Basaglar.  This morning his fasting glucose was 231.  His skin rash is pruritic and he has noted some erythema similar to prior flares.  He had some leftover Lotrisone cream which seemed to settle things down some overnight.  He has done well with fluconazole in the past.  Past Medical History:  Diagnosis Date   Diabetes mellitus without complication (HCC)    GERD (gastroesophageal reflux disease)    Gout    Hx of adenomatous colonic polyps 08/31/2022   08/2022 - 3 polyps max 8 mm 1 TV adenoma + 2 TA's recall 2027   Hyperlipidemia    Hypertension    [place] MED'S   Obesity, Class III, BMI 40-49.9 (morbid obesity) ([place])    Sleep apnea    NO C PAP   Past Surgical History:  Procedure Laterality Date   BACK SURGERY     SPINE SURGERY      reports that he quit smoking about 13 years ago. His smoking use included cigarettes. He has a 15.00 pack-year smoking history. He has been exposed to tobacco smoke. He has never used smokeless tobacco. He reports current alcohol use. He reports that he does not use drugs. family history includes Arthritis in his mother; Cancer in an other family member; Colon cancer (age of onset: 70) in his maternal grandfather; Hypertension in an other family member. No Known Allergies  Review of Systems  Constitutional:  Negative  for chills and fever.  Genitourinary:  Negative for dysuria.  Skin:  Positive for rash.      Objective:     BP 138/76 (BP Location: Left Arm, Patient Position: Sitting, Cuff Size: Large)   Pulse 78   Temp 98.3 F (36.8 C) (Oral)   Ht '6\' 2"'$  (1.88 m)   Wt (!) 355 lb 9.6 oz (161.3 kg)   SpO2 98%   BMI 45.66 kg/m    Physical Exam Vitals reviewed.  Constitutional:      Appearance: Normal appearance.  Cardiovascular:     Rate and Rhythm: Normal rate and regular rhythm.  Skin:    Findings: Rash present.     Comments: He has some mild erythema involving the glans of the penis and distal shaft.  No discharge noted at this time.  Neurological:     Mental Status: He is alert.      No results found for any visits on 09/08/22.    The 10-year ASCVD risk score (Arnett DK, et al., 2019) is: 6.1%    Assessment & Plan:   Yeast balanitis, recurrent, in a type II diabetic with poor diabetes control currently.  No longer on SGLT2 medication.  -Discussed importance of improved glycemic control and he is working with Dr. Martinique on  that.  He unfortunately did not have coverage for GLP-1 medication -Keep area dry as possible -Continue topical antifungal couple times daily -We did go ahead and write for fluconazole 100 mg once daily for 7 days if he is not clearing with topical alone  Carolann Littler, MD

## 2022-09-17 ENCOUNTER — Ambulatory Visit: Payer: 59 | Admitting: Family Medicine

## 2022-09-17 ENCOUNTER — Encounter: Payer: Self-pay | Admitting: Family Medicine

## 2022-09-17 VITALS — BP 132/80 | HR 79 | Temp 98.4°F | Resp 16 | Ht 74.0 in | Wt 355.1 lb

## 2022-09-17 DIAGNOSIS — M722 Plantar fascial fibromatosis: Secondary | ICD-10-CM | POA: Diagnosis not present

## 2022-09-17 DIAGNOSIS — M7712 Lateral epicondylitis, left elbow: Secondary | ICD-10-CM | POA: Diagnosis not present

## 2022-09-17 DIAGNOSIS — E1169 Type 2 diabetes mellitus with other specified complication: Secondary | ICD-10-CM | POA: Diagnosis not present

## 2022-09-17 DIAGNOSIS — Z794 Long term (current) use of insulin: Secondary | ICD-10-CM | POA: Diagnosis not present

## 2022-09-17 MED ORDER — CELECOXIB 100 MG PO CAPS: 100.0000 mg | ORAL_CAPSULE | Freq: Two times a day (BID) | ORAL | 0 refills | Status: AC

## 2022-09-17 NOTE — Patient Instructions (Signed)
A few things to remember from today's visit:  Plantar fasciitis  Lateral epicondylitis of left elbow  Type 2 diabetes mellitus with other specified complication, with long-term current use of insulin (HCC)  Icy hot or lidocaine patches on left elbow may help with pain. PT is usually recommended, let me know if you decide to do it.  For right foot pain, plantar fascitis splint at bedtime may help. If not better in a couple weeks you may need to see a foot specialist.  Let me know in 2-3 weeks about blood sugars. If you need refills for medications you take chronically, please call your pharmacy. Do not use My Chart to request refills or for acute issues that need immediate attention. If you send a my chart message, it may take a few days to be addressed, specially if I am not in the office.  Please be sure medication list is accurate. If a new problem present, please set up appointment sooner than planned today.

## 2022-09-17 NOTE — Progress Notes (Signed)
ACUTE VISIT Chief Complaint  Patient presents with   Foot Pain    Right, swollen under the foot, no shooting pains, sore.   Elbow Pain    left   HPI: [name] is a 47 y.o. male with PMHx significant for DM II, gout, HLD, and BMI 45 here today with above complaint.  Right foot plantar sharp pain, which he has been experiencing for a couple of months. He denies any history of injuries/trauma,new shoe wear, or unusual activities.  Negative for skin lesions erythema on the affected area.  Pain worsens upon walking when getting up in the morning and after prolonged rest. Alleviated after walking a few steps. Also aggravated after prolonged standing.  He has some numbness and tingling, more prominent in his left foot, chronic.  He is also having left elbow pain, which has been going on for a few months. He thought initially to be gout-related but did not change after increasing dose of Allopurinol from 100 mg to 200 mg. Pain is on lateral area. No limitation of ROM. Achy, sharp pain 6/10. He has not noted joint edema or erythema.  Lab Results  Component Value Date   LABURIC 4.8 05/01/2018   Concerned about elevated BS's. He has made dietary changes, cutting out sweets and monitoring his food intake, but his blood sugar remains elevated, 200's. He is currently taking Tradgenda 5 mg daily and Basaglar 25 units. Lab Results  Component Value Date   HGBA1C 11.0 (A) 08/30/2022   Review of Systems  Constitutional:  Negative for fever.  Respiratory:  Negative for cough and shortness of breath.   Cardiovascular:  Negative for chest pain.  Gastrointestinal:  Negative for abdominal pain, nausea and vomiting.  Skin:  Negative for rash.  Neurological:  Negative for syncope and weakness.  See other pertinent positives and negatives in HPI.  Current Outpatient Medications on File Prior to Visit  Medication Sig Dispense Refill   allopurinol (ZYLOPRIM) 100 MG tablet Take 1  tablet (100 mg total) by mouth 2 (two) times daily. 180 tablet 1   blood glucose meter kit and supplies KIT Dispense based on patient and insurance preference. Use up to four times daily as directed. 1 each 0   gemfibrozil (LOPID) 600 MG tablet Take 1 tablet (600 mg total) by mouth 2 (two) times daily before a meal. 180 tablet 1   Insulin Glargine (BASAGLAR KWIKPEN) 100 UNIT/ML Inject 25 Units into the skin daily. 15 mL 1   Insulin Pen Needle (B-D UF III MINI PEN NEEDLES) 31G X 5 MM MISC USE WITH SAXENDA DAILY AS DIRECTED 100 each 3   linagliptin (TRADJENTA) 5 MG TABS tablet Take 1 tablet (5 mg total) by mouth daily. 30 tablet 3   MITIGARE 0.6 MG CAPS TAKE 1 TABLET BY MOUTH DAILY AS NEEDED. HOLD ATORVASTAIN WHEN TAKING THIS MEDICATION 30 capsule 3   rosuvastatin (CRESTOR) 20 MG tablet Take 1 tablet (20 mg total) by mouth daily. 90 tablet 3   traZODone (DESYREL) 50 MG tablet Take 1 tablet (50 mg total) by mouth at bedtime. 90 tablet 1   No current facility-administered medications on file prior to visit.   Past Medical History:  Diagnosis Date   Diabetes mellitus without complication (HCC)    GERD (gastroesophageal reflux disease)    Gout    Hx of adenomatous colonic polyps 08/31/2022   08/2022 - 3 polyps max 8 mm 1 TV adenoma + 2 TA's recall 2027   Hyperlipidemia  Hypertension    [facility] MED'S   Obesity, Class III, BMI 40-49.9 (morbid obesity) (HCC)    Sleep apnea    NO C PAP   No Known Allergies  Social History   Socioeconomic History   Marital status: Married    Spouse name: Not on file   Number of children: 2   Years of education: Not on file   Highest education level: Not on file  Occupational History   Occupation: unemployed  Tobacco Use   Smoking status: Former    Packs/day: 1.50    Years: 10.00    Total pack years: 15.00    Types: Cigarettes    Quit date: 08/09/2009    Years since quitting: 13.1    Passive exposure: Past   Smokeless tobacco: Never  Vaping Use    Vaping Use: Never used  Substance and Sexual Activity   Alcohol use: Yes    Comment: OCCASSIONALY   Drug use: No   Sexual activity: Yes  Other Topics Concern   Not on file  Social History Narrative   Marital status: married x 5 years. Separated from wife in 08/2012.     Children: 2 children from previous marriage; gets children every two weeks; sees more frequently.     Lives: alone; separated from wife in 08/2012.     Employment: Glass blower/designer.      Tobacco: quit; smoked x 13 years.      Alcohol: once every three months.      Drugs: none now; marijuana in past.      Exercise: started going to gym in 08/2012; going 4-5 days per week; elliptical and stationary bike.            Social Determinants of Health   Financial Resource Strain: Unknown (09/16/2022)   Overall Financial Resource Strain (CARDIA)    Difficulty of Paying Living Expenses: Patient refused  Food Insecurity: Unknown (09/16/2022)   Hunger Vital Sign    Worried About Running Out of Food in the Last Year: Patient refused    [place] in the Last Year: Patient refused  Transportation Needs: No Transportation Needs (09/16/2022)   PRAPARE - Hydrologist (Medical): No    Lack of Transportation (Non-Medical): No  Physical Activity: Unknown (09/16/2022)   Exercise Vital Sign    Days of Exercise per Week: 0 days    Minutes of Exercise per Session: Not on file  Stress: No Stress Concern Present (09/16/2022)   [place]    Feeling of Stress : Only a little  Social Connections: Unknown (09/16/2022)   Social Connection and Isolation Panel [NHANES]    Frequency of Communication with Friends and Family: Not on file    Frequency of Social Gatherings with Friends and Family: Not on file    Attends Religious Services: Not on file    Active Member of Clubs or Organizations: Patient refused    Attends Archivist Meetings: Not on  file    Marital Status: Not on file   Vitals:   09/17/22 0727  BP: 132/80  Pulse: 79  Resp: 16  Temp: 98.4 F (36.9 C)  SpO2: 98%   Wt Readings from Last 3 Encounters:  09/17/22 (!) 355 lb 2 oz (161.1 kg)  09/08/22 (!) 355 lb 9.6 oz (161.3 kg)  08/30/22 (!) 352 lb 6 oz (159.8 kg)  Body mass index is 45.6 kg/m.  Physical Exam Vitals  and nursing note reviewed.  Constitutional:      General: He is not in acute distress.    Appearance: He is well-developed. He is not ill-appearing.  HENT:     Head: Normocephalic and atraumatic.  Eyes:     Conjunctiva/sclera: Conjunctivae normal.  Cardiovascular:     Rate and Rhythm: Normal rate and regular rhythm.     Heart sounds: No murmur heard. Pulmonary:     Effort: Pulmonary effort is normal. No respiratory distress.  Musculoskeletal:     Left elbow: Normal range of motion. Tenderness present in lateral epicondyle. No olecranon process tenderness.     Comments: Lateral epicondyle pain with passive supination and active with resistance. Right foot:Tenderness upon palpation of heel mainly at the medial insertion of plantar fascia into calcaneous. Also mild discomfort with palpation along planta fascia towards forefoot. + bunion  Dorsal flexion of first MTP does not elicits pain.  No edema or erythema appreciated on area.  Skin:    General: Skin is warm.     Findings: No erythema or rash.  Neurological:     Mental Status: He is alert and oriented to person, place, and time.  Psychiatric:        Mood and Affect: Mood and affect normal.   ASSESSMENT AND PLAN:  Mr.  Olga was seen today for foot pain and elbow pain.  Diagnoses and all orders for this visit:  Lateral epicondylitis of left elbow We discussed Dx and prognosis. I do not think imaging is needed. He is not interested in PT. Celebrex may help as well as topical icy hot or ice.  -     celecoxib (CELEBREX) 100 MG capsule; Take 1 capsule (100 mg total) by mouth 2  (two) times daily for 10 days.  Plantar fasciitis Educated about Dx,prognosis,and treatment options. Stretching exercises, night splint,comfortable shoes, and shoe insert will help. If still having pain in 3-4 weeks, he may need to see podiatrist to discuss other treatment options. Wt loss will also help.  -     celecoxib (CELEBREX) 100 MG capsule; Take 1 capsule (100 mg total) by mouth 2 (two) times daily for 10 days.  Type 2 diabetes mellitus with other specified complication ([place]) Still having BS's in the 200's, treatment was just adjusted and he just made positive dietary changes; so we can wait a couple more weeks before making further adjustments. His health insurance did not cover GLP-1 agonists and he did not tolerate Jardiance. Continue Basaglar 25 U daily and Tradgenta 5 mg daily. We can consider adding Actos 15 mg daily if BS's still elevated.  Return if symptoms worsen or fail to improve, for keep next appointment.  Adriyana Greenbaum G. Martinique, MD  [facility]. [place] office.

## 2022-09-17 NOTE — Assessment & Plan Note (Signed)
Still having BS's in the 200's, treatment was just adjusted and he just made positive dietary changes; so we can wait a couple more weeks before making further adjustments. His health insurance did not cover GLP-1 agonists and he did not tolerate Jardiance. Continue Basaglar 25 U daily and Tradgenta 5 mg daily. We can consider adding Actos 15 mg daily if BS's still elevated.

## 2022-10-04 ENCOUNTER — Ambulatory Visit: Payer: 59 | Admitting: Skilled Nursing Facility1

## 2022-11-28 ENCOUNTER — Other Ambulatory Visit: Payer: Self-pay | Admitting: Family Medicine

## 2022-11-28 DIAGNOSIS — G4700 Insomnia, unspecified: Secondary | ICD-10-CM

## 2022-12-06 ENCOUNTER — Encounter: Payer: Self-pay | Admitting: Family Medicine

## 2022-12-06 ENCOUNTER — Ambulatory Visit (INDEPENDENT_AMBULATORY_CARE_PROVIDER_SITE_OTHER): Payer: 59 | Admitting: Family Medicine

## 2022-12-06 VITALS — BP 128/80 | HR 100 | Resp 16 | Ht 74.0 in | Wt 354.2 lb

## 2022-12-06 DIAGNOSIS — M7712 Lateral epicondylitis, left elbow: Secondary | ICD-10-CM

## 2022-12-06 DIAGNOSIS — E1169 Type 2 diabetes mellitus with other specified complication: Secondary | ICD-10-CM | POA: Diagnosis not present

## 2022-12-06 DIAGNOSIS — M545 Low back pain, unspecified: Secondary | ICD-10-CM | POA: Diagnosis not present

## 2022-12-06 DIAGNOSIS — Z6841 Body Mass Index (BMI) 40.0 and over, adult: Secondary | ICD-10-CM

## 2022-12-06 DIAGNOSIS — Z794 Long term (current) use of insulin: Secondary | ICD-10-CM

## 2022-12-06 LAB — POC URINALSYSI DIPSTICK (AUTOMATED)
Bilirubin, UA: NEGATIVE
Blood, UA: NEGATIVE
Glucose, UA: POSITIVE — AB
Ketones, UA: NEGATIVE
Leukocytes, UA: NEGATIVE
Nitrite, UA: NEGATIVE
Protein, UA: POSITIVE — AB
Spec Grav, UA: 1.025 (ref 1.010–1.025)
Urobilinogen, UA: 0.2 E.U./dL
pH, UA: 5.5 (ref 5.0–8.0)

## 2022-12-06 MED ORDER — CELECOXIB 100 MG PO CAPS: 100.0000 mg | ORAL_CAPSULE | Freq: Every day | ORAL | 0 refills | Status: DC | PRN

## 2022-12-06 NOTE — Patient Instructions (Addendum)
A few things to remember from today's visit:  Right-sided low back pain without sciatica, unspecified chronicity - Plan: Ambulatory referral to Sports Medicine, celecoxib (CELEBREX) 100 MG capsule  Lateral epicondylitis of left elbow - Plan: Ambulatory referral to Sports Medicine, celecoxib (CELEBREX) 100 MG capsule  If you need refills for medications you take chronically, please call your pharmacy. Do not use My Chart to request refills or for acute issues that need immediate attention. If you send a my chart message, it may take a few days to be addressed, specially if I am not in the office.  Please be sure medication list is accurate. If a new problem present, please set up appointment sooner than planned today.  Printer did not work.

## 2022-12-06 NOTE — Progress Notes (Unsigned)
ACUTE VISIT Chief Complaint  Patient presents with   Back Pain    X months, comes & goes. Was worse this morning, tender & dull pain.    Elbow Pain    Left elbow, ongoing from last visit. Still swollen   HPI: [name] is a 47 y.o. male, who is here today complaining of *** HPI persistent left elbow pain and recent episodes of back pain. He reports that the elbow pain, previously treated with a 10-day medication course, improved but did not fully resolve. The medication caused dry, chapped lips, which subsided after discontinuation. He describes the elbow as swollen with a knot, suggesting possible epicondylitis. For back pain, he experiences intermittent discomfort, particularly severe in the mornings, described as a dull, tender soreness in the right lower back without radiation to the legs. The pain improves with movement throughout the day, reaching a maximum discomfort level of 5 or 6 out of 10. He expresses concern that the pain might indicate an issue with an internal organ rather than musculoskeletal origin.  Miguel Sanders has a history of fatty liver identified on imaging, but liver function tests remain normal. He has not taken any medication for the back pain due to concerns about liver health. His past medical history includes diabetes, managed with Basaglar insulin, and a recent episode of hypoglycemia, unusual for him, after administering his insulin shot. He questions whether an accidental intravenous injection could have caused this. His blood sugars typically range from 160 to 170 mg/dL, with the hypoglycemic episode dropping to 90 mg/dL, which he found symptomatic despite being within a normal range.  He is skeptical about the efficacy of physical therapy, based on personal and familial experiences, but is open to diagnostic imaging to further investigate his conditions. Miguel Sanders has not pursued treatment for the back pain due to concerns about his liver health and is  cautious about medication use. Review of Systems See other pertinent positives and negatives in HPI.  Current Outpatient Medications on File Prior to Visit  Medication Sig Dispense Refill   allopurinol (ZYLOPRIM) 100 MG tablet Take 1 tablet (100 mg total) by mouth 2 (two) times daily. 180 tablet 1   blood glucose meter kit and supplies KIT Dispense based on patient and insurance preference. Use up to four times daily as directed. 1 each 0   gemfibrozil (LOPID) 600 MG tablet Take 1 tablet (600 mg total) by mouth 2 (two) times daily before a meal. 180 tablet 1   Insulin Glargine (BASAGLAR KWIKPEN) 100 UNIT/ML Inject 25 Units into the skin daily. 15 mL 1   Insulin Pen Needle (B-D UF III MINI PEN NEEDLES) 31G X 5 MM MISC USE WITH SAXENDA DAILY AS DIRECTED 100 each 3   linagliptin (TRADJENTA) 5 MG TABS tablet Take 1 tablet (5 mg total) by mouth daily. 30 tablet 3   MITIGARE 0.6 MG CAPS TAKE 1 TABLET BY MOUTH DAILY AS NEEDED. HOLD ATORVASTAIN WHEN TAKING THIS MEDICATION 30 capsule 3   rosuvastatin (CRESTOR) 20 MG tablet Take 1 tablet (20 mg total) by mouth daily. 90 tablet 3   traZODone (DESYREL) 50 MG tablet TAKE 1 TABLET(50 MG) BY MOUTH AT BEDTIME 90 tablet 1   No current facility-administered medications on file prior to visit.    Past Medical History:  Diagnosis Date   Diabetes mellitus without complication (HCC)    GERD (gastroesophageal reflux disease)    Gout    Hx of adenomatous colonic polyps 08/31/2022   08/2022 -  3 polyps max 8 mm 1 TV adenoma + 2 TA's recall 2027   Hyperlipidemia    Hypertension    [place] MED'S   Obesity, Class III, BMI 40-49.9 (morbid obesity) (HCC)    Sleep apnea    NO C PAP   No Known Allergies  Social History   Socioeconomic History   Marital status: Married    Spouse name: Not on file   Number of children: 2   Years of education: Not on file   Highest education level: Not on file  Occupational History   Occupation: unemployed  Tobacco Use   Smoking  status: Former    Packs/day: 1.50    Years: 10.00    Additional pack years: 0.00    Total pack years: 15.00    Types: Cigarettes    Quit date: 08/09/2009    Years since quitting: 13.3    Passive exposure: Past   Smokeless tobacco: Never  Vaping Use   Vaping Use: Never used  Substance and Sexual Activity   Alcohol use: Yes    Comment: OCCASSIONALY   Drug use: No   Sexual activity: Yes  Other Topics Concern   Not on file  Social History Narrative   Marital status: married x 5 years. Separated from wife in 08/2012.     Children: 2 children from previous marriage; gets children every two weeks; sees more frequently.     Lives: alone; separated from wife in 08/2012.     Employment: Conservator, museum/gallery.      Tobacco: quit; smoked x 13 years.      Alcohol: once every three months.      Drugs: none now; marijuana in past.      Exercise: started going to gym in 08/2012; going 4-5 days per week; elliptical and stationary bike.            Social Determinants of Health   Financial Resource Strain: Patient Declined (09/16/2022)   Overall Financial Resource Strain (CARDIA)    Difficulty of Paying Living Expenses: Patient declined  Food Insecurity: Patient Declined (09/16/2022)   Hunger Vital Sign    Worried About Running Out of Food in the Last Year: Patient declined    Ran Out of Food in the Last Year: Patient declined  Transportation Needs: No Transportation Needs (09/16/2022)   PRAPARE - Administrator, Civil Service (Medical): No    Lack of Transportation (Non-Medical): No  Physical Activity: Unknown (09/16/2022)   Exercise Vital Sign    Days of Exercise per Week: 0 days    Minutes of Exercise per Session: Not on file  Stress: No Stress Concern Present (09/16/2022)   [employer] of Occupational Health - Occupational Stress Questionnaire    Feeling of Stress : Only a little  Social Connections: Unknown (09/16/2022)   Social Connection and Isolation Panel [NHANES]    Frequency  of Communication with Friends and Family: Not on file    Frequency of Social Gatherings with Friends and Family: Not on file    Attends Religious Services: Not on file    Active Member of Clubs or Organizations: Patient declined    Attends Banker Meetings: Not on file    Marital Status: Not on file    Vitals:   12/06/22 1554  BP: 128/80  Pulse: 100  SpO2: 97%   Body mass index is 45.48 kg/m.  Physical Exam Constitutional:      General: He is not in acute distress.  Appearance: He is well-developed.  HENT:     Head: Atraumatic.  Eyes:     Conjunctiva/sclera: Conjunctivae normal.  Cardiovascular:     Rate and Rhythm: Normal rate and regular rhythm.     Heart sounds: No murmur heard. Pulmonary:     Effort: Pulmonary effort is normal. No respiratory distress.     Breath sounds: Normal breath sounds.  Abdominal:     Palpations: Abdomen is soft. There is no hepatomegaly or mass.     Tenderness: There is no abdominal tenderness.  Musculoskeletal:     Comments: No significant deformity appreciated. *** tenderness upon palpation of paraspinal muscles. Pain elicited with movement on exam table during examination. No local edema or erythema appreciated, no suspicious lesions.   Skin:    General: Skin is warm.     Findings: No erythema.  Neurological:     Mental Status: He is alert and oriented to person, place, and time.     Coordination: Coordination normal.     Deep Tendon Reflexes:     Reflex Scores:      Patellar reflexes are 2+ on the right side and 2+ on the left side.      Achilles reflexes are 2+ on the right side and 2+ on the left side. Psychiatric:     Comments: Well groomed,good eye contact.     ASSESSMENT AND PLAN: Right-sided low back pain without sciatica, unspecified chronicity -     Ambulatory referral to Sports Medicine -     Celecoxib; Take 1 capsule (100 mg total) by mouth daily as needed.  Dispense: 30 capsule; Refill: 0  Lateral  epicondylitis of left elbow -     Ambulatory referral to Sports Medicine -     Celecoxib; Take 1 capsule (100 mg total) by mouth daily as needed.  Dispense: 30 capsule; Refill: 0    No follow-ups on file.  Rachelle Edwards G. [place], MD  [facility]. [place] office.  Discharge Instructions   None

## 2022-12-09 ENCOUNTER — Other Ambulatory Visit: Payer: Self-pay | Admitting: Family Medicine

## 2022-12-09 ENCOUNTER — Encounter: Payer: Self-pay | Admitting: Family Medicine

## 2022-12-09 DIAGNOSIS — M109 Gout, unspecified: Secondary | ICD-10-CM

## 2022-12-09 DIAGNOSIS — E781 Pure hyperglyceridemia: Secondary | ICD-10-CM

## 2022-12-09 NOTE — Progress Notes (Signed)
Aleen Sells D.[facility] [address] [place] [address] Phone: [phone]   Assessment and Plan:     1. Chronic bilateral low back pain without sciatica 2. DDD (degenerative disc disease), lumbar 3. Lumbar facet arthropathy -Chronic with exacerbation, initial sports medicine visit - Most consistent with flare of lumbar DDD and facet arthropathy, primarily on right side based on HPI, physical exam, x-ray imaging - X-ray obtained in clinic.  My interpretation: No acute fracture or vertebral collapse.  DDD most significant at L4-5 and L5-S1.  Anterior facet spurring throughout lumbar spine.  Significant facet arthropathy most prominent at L4-L5-S1 - Start meloxicam 15 mg daily x2 weeks.  If still having pain after 2 weeks, complete 3rd-week of meloxicam. May use remaining meloxicam as needed once daily for pain control.  Do not to use additional NSAIDs while taking meloxicam.  May use Tylenol [phone] 2 to 3 times a day for breakthrough pain. -Start HEP for low back  4. Left elbow pain 5. Left lateral epicondylitis -Chronic exacerbation, initial sports medicine visit - Most consistent with lateral epicondylitis based on HPI and physical exam - X-ray obtained in clinic.  My interpretation: No acute fracture or dislocation.  Overall unremarkable imaging - Start meloxicam 15 mg daily x2 weeks.  If still having pain after 2 weeks, complete 3rd-week of meloxicam. May use remaining meloxicam as needed once daily for pain control.  Do not to use additional NSAIDs while taking meloxicam.  May use Tylenol [phone] mg 2 to 3 times a day for breakthrough pain. - Start HEP for tennis elbow  *Encourage patient to improve glycemic control with last A1c 11.0.  The better optimized his DM type II, the better patient will heal.  Other orders - meloxicam (MOBIC) 15 MG tablet; Take 1 tablet (15 mg total) by mouth daily.    Pertinent previous records reviewed  include A1c 11.0   Follow Up: 5 weeks for reevaluation.  Could consider physical therapy versus advanced imaging versus bracing   Subjective:   I, Moenique Parris, am serving as a Neurosurgeon for Doctor Richardean Sale  Chief Complaint: left elbow and low back pain   HPI:   12/10/2022 Patient is a 47 year old male complaining of left elbow and low back pain. Patient states He was seen on 09/17/22 when he initially reported a few months of left elbow pain. He felt like it was caused by gout, no improvement after increasing dose of Allopurinol.No hx of trauma. states all pain is mild and comes and goes    He took Celebrex 100 mg bid for 10 days,elbow pain improved but did not fully resolve. He mentions that medication caused dry, chapped lips, which subsided after discontinuation. He describes the elbow as swollen with a "knot", [place] exacerbated with certain movement. Decreased grip strength , no numbness or tingling    -For back pain, he experiences intermittent discomfort, particularly severe in the mornings, described as a dull, tender soreness in the right lower back without radiation to the legs. The pain improves with movement throughout the day, reaching a maximum discomfort level of 5 or 6 out of 10. He expresses concern that the pain might indicate an issue with an "internal organ" serious process. He has not noted gross hematuria, changes in urine frequency,or dysuria. Lumbar spine surgery a few years ago. He has had occasional back pain for a while but the pain he is reporting today is different. Negative for saddle  anesthesia, LE numbness/tingling,or bowel/bladder dysfunction.   He has not taken any medication for the back pain due to concerns about liver health.  Relevant Historical Information: Elevated BMI, GERD, DM type II poorly controlled  Additional pertinent review of systems negative.   Current Outpatient Medications:    allopurinol (ZYLOPRIM) 100 MG tablet, TAKE 1  TABLET(100 MG) BY MOUTH TWICE DAILY, Disp: 180 tablet, Rfl: 1   blood glucose meter kit and supplies KIT, Dispense based on patient and insurance preference. Use up to four times daily as directed., Disp: 1 each, Rfl: 0   celecoxib (CELEBREX) 100 MG capsule, Take 1 capsule (100 mg total) by mouth daily as needed., Disp: 30 capsule, Rfl: 0   gemfibrozil (LOPID) 600 MG tablet, TAKE 1 TABLET(600 MG) BY MOUTH TWICE DAILY BEFORE A MEAL, Disp: 180 tablet, Rfl: 1   Insulin Glargine (BASAGLAR KWIKPEN) 100 UNIT/ML, Inject 25 Units into the skin daily., Disp: 15 mL, Rfl: 1   Insulin Pen Needle (B-D UF III MINI PEN NEEDLES) 31G X 5 MM MISC, USE WITH SAXENDA DAILY AS DIRECTED, Disp: 100 each, Rfl: 3   linagliptin (TRADJENTA) 5 MG TABS tablet, Take 1 tablet (5 mg total) by mouth daily., Disp: 30 tablet, Rfl: 3   meloxicam (MOBIC) 15 MG tablet, Take 1 tablet (15 mg total) by mouth daily., Disp: 30 tablet, Rfl: 0   MITIGARE 0.6 MG CAPS, TAKE 1 TABLET BY MOUTH DAILY AS NEEDED. HOLD ATORVASTAIN WHEN TAKING THIS MEDICATION, Disp: 30 capsule, Rfl: 3   rosuvastatin (CRESTOR) 20 MG tablet, Take 1 tablet (20 mg total) by mouth daily., Disp: 90 tablet, Rfl: 3   traZODone (DESYREL) 50 MG tablet, TAKE 1 TABLET(50 MG) BY MOUTH AT BEDTIME, Disp: 90 tablet, Rfl: 1   Objective:     Vitals:   12/10/22 0844  BP: (!) 140/82  Pulse: 71  SpO2: 98%  Weight: (!) 354 lb (160.6 kg)  Height: 6\' 2"  (1.88 m)      Body mass index is 45.45 kg/m.    Physical Exam:    Gen: Appears well, nad, nontoxic and pleasant Psych: Alert and oriented, appropriate mood and affect Neuro: sensation intact, strength is 5/5 in upper and lower extremities, muscle tone wnl Skin: no susupicious lesions or rashes  Back - Normal skin, Spine with normal alignment and no deformity.   No tenderness to vertebral process palpation.   Mild right lumbar paraspinous muscles tenderness without spasm NTTP gluteal musculature Straight leg raise  negative Piriformis Test negative     Left elbow: No deformity, swelling or muscle wasting Normal Carrying angle ROM:0-140, supination and pronation 90 TTP lateral epicondyle NTTP over triceps, ticeps tendon, olecronon, medial epicondyle, antecubital fossa, biceps tendon, supinator, pronator Negative tinnels over cubital tunnel   pain with resisted wrist and middle digit extension No pain with resisted wrist flexion Mild pain with resisted supination No pain with resisted pronation Negative valgus stress Negative varus stress    Electronically signed by:  Aleen Sells [facility] 9:19 AM 12/10/22

## 2022-12-09 NOTE — Assessment & Plan Note (Signed)
His wt has been stable since his last visit. We discussed adverse effects and benefits of wt loss. Encouraged consistently with a healthier life style.

## 2022-12-10 ENCOUNTER — Ambulatory Visit (INDEPENDENT_AMBULATORY_CARE_PROVIDER_SITE_OTHER): Payer: 59

## 2022-12-10 ENCOUNTER — Ambulatory Visit: Payer: 59 | Admitting: Sports Medicine

## 2022-12-10 VITALS — BP 140/82 | HR 71 | Ht 74.0 in | Wt 354.0 lb

## 2022-12-10 DIAGNOSIS — M25522 Pain in left elbow: Secondary | ICD-10-CM | POA: Diagnosis not present

## 2022-12-10 DIAGNOSIS — M47816 Spondylosis without myelopathy or radiculopathy, lumbar region: Secondary | ICD-10-CM

## 2022-12-10 DIAGNOSIS — M7712 Lateral epicondylitis, left elbow: Secondary | ICD-10-CM

## 2022-12-10 DIAGNOSIS — M545 Low back pain, unspecified: Secondary | ICD-10-CM

## 2022-12-10 DIAGNOSIS — M5136 Other intervertebral disc degeneration, lumbar region: Secondary | ICD-10-CM | POA: Diagnosis not present

## 2022-12-10 DIAGNOSIS — G8929 Other chronic pain: Secondary | ICD-10-CM

## 2022-12-10 MED ORDER — MELOXICAM 15 MG PO TABS
15.0000 mg | ORAL_TABLET | Freq: Every day | ORAL | 0 refills | Status: DC
Start: 2022-12-10 — End: 2023-08-23

## 2022-12-10 NOTE — Patient Instructions (Addendum)
Good to see you  Low back and elbow HEP  - Start meloxicam 15 mg daily x2 weeks.  If still having pain after 2 weeks, complete 3rd-week of meloxicam. May use remaining meloxicam as needed once daily for pain control.  Do not to use additional NSAIDs while taking meloxicam.  May use Tylenol [phone] mg 2 to 3 times a day for breakthrough pain. 5 week follow up

## 2022-12-13 ENCOUNTER — Other Ambulatory Visit: Payer: Self-pay | Admitting: Family Medicine

## 2022-12-15 NOTE — Progress Notes (Signed)
HPI: [name] is a 47 y.o.male with PMHx significant for DM II,HLD,aortic atherosclerosis,gout, and BMI 50 today for his routine physical examination.  Last CPE: 02/05/22, so  His last physical was on June 30th, 2023, so we changed visit for a folow up.   Since his last visit,12/06/22, he has seen sport medicine and started on Meloxicam.  He has undergone x-rays for his back and left elbow.  He confirms that he is still taking Celebrex.  Diabetes Mellitus II: Diagnosed in 2012. He reports that his blood sugar levels are generally well-controlled, typically staying below 200. He is currently taking Basaglar at a dosage of 35 units daily and Tradjenta at 5 mg daily.  Regarding his ocular health, he is unsure of when his last eye exam was conducted, it has been over a year ago. Negative for symptoms of hypoglycemia, polyuria, polydipsia, numbness extremities, foot ulcers/trauma His physical activity is limited to his regular work routine, and he has made dietary changes by reducing his intake of sweets and overall food consumption.  Lab Results  Component Value Date   HGBA1C 11.0 (A) 08/30/2022   Lab Results  Component Value Date   MICROALBUR 25.3 (H) 08/30/2022   To manage HLD, he is on rosuvastatin 20 mg daily and Lopid, which was added in 08/2022, 600 mg twice daily.  Lab Results  Component Value Date   CHOL 156 08/30/2022   HDL 27.30 (L) 08/30/2022   LDLCALC  05/14/2020     Comment:     . LDL cholesterol not calculated. Triglyceride levels greater than 400 mg/dL invalidate calculated LDL results. . Reference range: <100 . Desirable range <100 mg/dL for primary prevention;   <70 mg/dL for patients with CHD or diabetic patients  with > or = 2 CHD risk factors. Marland Kitchen LDL-C is now calculated using the Martin-Hopkins  calculation, which is a validated novel method providing  better accuracy than the Friedewald equation in the  estimation of LDL-C.  Horald Pollen et  al. Lenox Ahr. 8119;147(82): 2061-2068  (http://education.[facility].com/faq/FAQ164)    LDLDIRECT 38.0 08/30/2022   TRIG (H) 08/30/2022    849.0 Triglyceride is over 400; calculations on Lipids are invalid.   CHOLHDL 6 08/30/2022   Lab Results  Component Value Date   CREATININE 0.99 08/30/2022   BUN 14 08/30/2022   NA 134 (L) 08/30/2022   K 4.1 08/30/2022   CL 97 08/30/2022   CO2 26 08/30/2022   Review of Systems  Constitutional:  Negative for chills and fever.  HENT:  Negative for mouth sores and sore throat.   Respiratory:  Negative for cough and wheezing.   Cardiovascular:  Negative for chest pain and palpitations.  Gastrointestinal:  Negative for abdominal pain, nausea and vomiting.  Endocrine: Negative for cold intolerance and heat intolerance.  Skin:  Negative for rash.  Neurological:  Negative for syncope, weakness and headaches.   Current Outpatient Medications on File Prior to Visit  Medication Sig Dispense Refill   allopurinol (ZYLOPRIM) 100 MG tablet TAKE 1 TABLET(100 MG) BY MOUTH TWICE DAILY 180 tablet 1   blood glucose meter kit and supplies KIT Dispense based on patient and insurance preference. Use up to four times daily as directed. 1 each 0   gemfibrozil (LOPID) 600 MG tablet TAKE 1 TABLET(600 MG) BY MOUTH TWICE DAILY BEFORE A MEAL 180 tablet 1   Insulin Pen Needle (B-D UF III MINI PEN NEEDLES) 31G X 5 MM MISC USE WITH SAXENDA DAILY AS DIRECTED 100  each 3   meloxicam (MOBIC) 15 MG tablet Take 1 tablet (15 mg total) by mouth daily. 30 tablet 0   MITIGARE 0.6 MG CAPS TAKE 1 TABLET BY MOUTH DAILY AS NEEDED. HOLD ATORVASTAIN WHEN TAKING THIS MEDICATION 30 capsule 3   rosuvastatin (CRESTOR) 20 MG tablet Take 1 tablet (20 mg total) by mouth daily. 90 tablet 3   TRADJENTA 5 MG TABS tablet TAKE 1 TABLET(5 MG) BY MOUTH DAILY 30 tablet 3   traZODone (DESYREL) 50 MG tablet TAKE 1 TABLET(50 MG) BY MOUTH AT BEDTIME 90 tablet 1   No current facility-administered medications  on file prior to visit.   Past Medical History:  Diagnosis Date   Diabetes mellitus without complication (HCC)    GERD (gastroesophageal reflux disease)    Gout    Hx of adenomatous colonic polyps 08/31/2022   08/2022 - 3 polyps max 8 mm 1 TV adenoma + 2 TA's recall 2027   Hyperlipidemia    Hypertension    MO MED'S   Obesity, Class III, BMI 40-49.9 (morbid obesity) (HCC)    Sleep apnea    NO C PAP    Past Surgical History:  Procedure Laterality Date   BACK SURGERY     SPINE SURGERY      No Known Allergies  Family History  Problem Relation Age of Onset   Arthritis Mother    Colon cancer Maternal Grandfather 85   Cancer Other    Hypertension Other    Colon polyps Neg Hx    Crohn's disease Neg Hx    Esophageal cancer Neg Hx    Rectal cancer Neg Hx    Stomach cancer Neg Hx    Ulcerative colitis Neg Hx     Social History   Socioeconomic History   Marital status: Married    Spouse name: Not on file   Number of children: 2   Years of education: Not on file   Highest education level: Not on file  Occupational History   Occupation: unemployed  Tobacco Use   Smoking status: Former    Packs/day: 1.50    Years: 10.00    Additional pack years: 0.00    Total pack years: 15.00    Types: Cigarettes    Quit date: 08/09/2009    Years since quitting: 13.3    Passive exposure: Past   Smokeless tobacco: Never  Vaping Use   Vaping Use: Never used  Substance and Sexual Activity   Alcohol use: Yes    Comment: OCCASSIONALY   Drug use: No   Sexual activity: Yes  Other Topics Concern   Not on file  Social History Narrative   Marital status: married x 5 years. Separated from wife in 08/2012.     Children: 2 children from previous marriage; gets children every two weeks; sees more frequently.     Lives: alone; separated from wife in 08/2012.     Employment: Conservator, museum/gallery.      Tobacco: quit; smoked x 13 years.      Alcohol: once every three months.      Drugs: none now;  marijuana in past.      Exercise: started going to gym in 08/2012; going 4-5 days per week; elliptical and stationary bike.            Social Determinants of Health   Financial Resource Strain: Patient Declined (09/16/2022)   Overall Financial Resource Strain (CARDIA)    Difficulty of Paying Living Expenses: Patient declined  Food  Insecurity: Patient Declined (09/16/2022)   Hunger Vital Sign    Worried About Running Out of Food in the Last Year: Patient declined    Ran Out of Food in the Last Year: Patient declined  Transportation Needs: No Transportation Needs (09/16/2022)   PRAPARE - Administrator, Civil Service (Medical): No    Lack of Transportation (Non-Medical): No  Physical Activity: Unknown (09/16/2022)   Exercise Vital Sign    Days of Exercise per Week: 0 days    Minutes of Exercise per Session: Not on file  Stress: No Stress Concern Present (09/16/2022)   [employer] of Occupational Health - Occupational Stress Questionnaire    Feeling of Stress : Only a little  Social Connections: Unknown (09/16/2022)   Social Connection and Isolation Panel [NHANES]    Frequency of Communication with Friends and Family: Not on file    Frequency of Social Gatherings with Friends and Family: Not on file    Attends Religious Services: Not on file    Active Member of Clubs or Organizations: Patient declined    Attends Banker Meetings: Not on file    Marital Status: Not on file    Vitals:   12/17/22 0729  BP: 118/80  Pulse: 76  Temp: 98.6 F (37 C)  SpO2: 98%   Body mass index is 46.01 kg/m.  Wt Readings from Last 3 Encounters:  12/17/22 (!) 358 lb 6 oz (162.6 kg)  12/10/22 (!) 354 lb (160.6 kg)  12/06/22 (!) 354 lb 4 oz (160.7 kg)    Physical Exam Vitals and nursing note reviewed.  Constitutional:      General: He is not in acute distress.    Appearance: He is well-developed.  HENT:     Head: Normocephalic and atraumatic.     Mouth/Throat:      Mouth: Mucous membranes are moist.  Eyes:     Conjunctiva/sclera: Conjunctivae normal.  Cardiovascular:     Rate and Rhythm: Normal rate and regular rhythm.     Pulses:          Dorsalis pedis pulses are 2+ on the right side and 2+ on the left side.     Heart sounds: No murmur heard.    Comments: Trace pitting LE edema,bilateral. Pulmonary:     Effort: Pulmonary effort is normal. No respiratory distress.     Breath sounds: Normal breath sounds.  Abdominal:     Palpations: Abdomen is soft. There is no hepatomegaly or mass.     Tenderness: There is no abdominal tenderness.  Lymphadenopathy:     Cervical: No cervical adenopathy.  Skin:    General: Skin is warm.     Findings: No erythema or rash.  Neurological:     Mental Status: He is alert and oriented to person, place, and time.     Cranial Nerves: No cranial nerve deficit.     Gait: Gait normal.  Psychiatric:        Mood and Affect: Mood and affect normal.   ASSESSMENT AND PLAN:  Mr.Norvel was seen today for annual exam.  Diagnoses and all orders for this visit: Lab Results  Component Value Date   HGBA1C 8.7 (H) 12/17/2022   Lab Results  Component Value Date   CHOL 133 12/17/2022   HDL 25.90 (L) 12/17/2022   LDLCALC  05/14/2020     Comment:     . LDL cholesterol not calculated. Triglyceride levels greater than 400 mg/dL invalidate calculated LDL results. Marland Kitchen  Reference range: <100 . Desirable range <100 mg/dL for primary prevention;   <70 mg/dL for patients with CHD or diabetic patients  with > or = 2 CHD risk factors. Marland Kitchen LDL-C is now calculated using the Martin-Hopkins  calculation, which is a validated novel method providing  better accuracy than the Friedewald equation in the  estimation of LDL-C.  Horald Pollen et al. Lenox Ahr. 1610;960(45): 2061-2068  (http://education.[facility].com/faq/FAQ164)    LDLDIRECT 60.0 12/17/2022   TRIG (H) 12/17/2022    445.0 Triglyceride is over 400; calculations on Lipids are  invalid.   CHOLHDL 5 12/17/2022   Lab Results  Component Value Date   CREATININE 0.85 12/17/2022   BUN 16 12/17/2022   NA 137 12/17/2022   K 4.4 12/17/2022   CL 102 12/17/2022   CO2 28 12/17/2022   Lab Results  Component Value Date   ALT 20 12/17/2022   AST 16 12/17/2022   ALKPHOS 74 12/17/2022   BILITOT 0.3 12/17/2022   Type 2 diabetes mellitus with other specified complication, with long-term current use of insulin (HCC) Assessment & Plan: Problem has not been well-controlled. Last hemoglobin A1c in 08/2022 was 11.0. He is reporting improvement. Continue Basaglar 35 units daily and Tradjenta 5 mg daily.  Further recommendation will be given according to hemoglobin A1c result. We discussed some of complications of poorly controlled glucose. Stressed the importance of periodic eye exam, dental and foot care.  He is overdue for eye exam, recommend arranging an appointment. Follow-up in 6 months.  Orders: -     Therapist, nutritional; Inject 35 Units into the skin daily.  Dispense: 15 mL; Refill: 1 -     Hemoglobin A1c; Future -     Microalbumin / creatinine urine ratio; Future  Hyperlipidemia associated with type 2 diabetes mellitus (HCC) Assessment & Plan: Continue Lopid 600 mg twice daily and rosuvastatin 20 mg daily. Low-fat diet also recommended. Further recommendation will be given according to lipid panel result.  Orders: -     Comprehensive metabolic panel; Future -     Lipid panel; Future  Other orders -     LDL cholesterol, direct  Return in 6 months (on 06/19/2023) for CPE.  Cortney Mckinney G. [place], MD  [facility]. [place] office.

## 2022-12-17 ENCOUNTER — Encounter: Payer: Self-pay | Admitting: Family Medicine

## 2022-12-17 ENCOUNTER — Ambulatory Visit (INDEPENDENT_AMBULATORY_CARE_PROVIDER_SITE_OTHER): Payer: 59 | Admitting: Family Medicine

## 2022-12-17 VITALS — BP 118/80 | HR 76 | Temp 98.6°F | Resp 16 | Ht 74.0 in | Wt 358.4 lb

## 2022-12-17 DIAGNOSIS — E1169 Type 2 diabetes mellitus with other specified complication: Secondary | ICD-10-CM

## 2022-12-17 DIAGNOSIS — E781 Pure hyperglyceridemia: Secondary | ICD-10-CM

## 2022-12-17 DIAGNOSIS — Z794 Long term (current) use of insulin: Secondary | ICD-10-CM | POA: Diagnosis not present

## 2022-12-17 DIAGNOSIS — Z0000 Encounter for general adult medical examination without abnormal findings: Secondary | ICD-10-CM

## 2022-12-17 DIAGNOSIS — Z7984 Long term (current) use of oral hypoglycemic drugs: Secondary | ICD-10-CM

## 2022-12-17 DIAGNOSIS — E785 Hyperlipidemia, unspecified: Secondary | ICD-10-CM

## 2022-12-17 LAB — COMPREHENSIVE METABOLIC PANEL
ALT: 20 U/L (ref 0–53)
AST: 16 U/L (ref 0–37)
Albumin: 3.9 g/dL (ref 3.5–5.2)
Alkaline Phosphatase: 74 U/L (ref 39–117)
BUN: 16 mg/dL (ref 6–23)
CO2: 28 mEq/L (ref 19–32)
Calcium: 9.4 mg/dL (ref 8.4–10.5)
Chloride: 102 mEq/L (ref 96–112)
Creatinine, Ser: 0.85 mg/dL (ref 0.40–1.50)
GFR: 104.79 mL/min (ref >60.00)
Glucose, Bld: 200 mg/dL — ABNORMAL HIGH (ref 70–99)
Potassium: 4.4 mEq/L (ref 3.5–5.1)
Sodium: 137 mEq/L (ref 135–145)
Total Bilirubin: 0.3 mg/dL (ref 0.2–1.2)
Total Protein: 7 g/dL (ref 6.0–8.3)

## 2022-12-17 LAB — LIPID PANEL
Cholesterol: 133 mg/dL (ref 0–200)
HDL: 25.9 mg/dL — ABNORMAL LOW (ref >39.00)
Total CHOL/HDL Ratio: 5
Triglycerides: 445 mg/dL — ABNORMAL HIGH (ref 0.0–149.0)

## 2022-12-17 LAB — MICROALBUMIN / CREATININE URINE RATIO
Creatinine,U: 123.6 mg/dL
Microalb Creat Ratio: 5.3 mg/g (ref 0.0–30.0)
Microalb, Ur: 6.6 mg/dL — ABNORMAL HIGH (ref 0.0–1.9)

## 2022-12-17 LAB — HEMOGLOBIN A1C: Hgb A1c MFr Bld: 8.7 % — ABNORMAL HIGH (ref 4.6–6.5)

## 2022-12-17 LAB — LDL CHOLESTEROL, DIRECT: Direct LDL: 60 mg/dL

## 2022-12-17 MED ORDER — BASAGLAR KWIKPEN 100 UNIT/ML ~~LOC~~ SOPN: 35.0000 [IU] | PEN_INJECTOR | Freq: Every day | SUBCUTANEOUS | 1 refills | Status: DC

## 2022-12-17 MED ORDER — BASAGLAR KWIKPEN 100 UNIT/ML ~~LOC~~ SOPN: 40.0000 [IU] | PEN_INJECTOR | Freq: Every day | SUBCUTANEOUS | 1 refills | Status: DC

## 2022-12-17 MED ORDER — ICOSAPENT ETHYL 1 G PO CAPS
2.0000 g | ORAL_CAPSULE | Freq: Two times a day (BID) | ORAL | 6 refills | Status: AC
Start: 2022-12-17 — End: ?

## 2022-12-17 NOTE — Assessment & Plan Note (Signed)
Continue Lopid 600 mg twice daily and rosuvastatin 20 mg daily. Low-fat diet also recommended. Further recommendation will be given according to lipid panel result.

## 2022-12-17 NOTE — Patient Instructions (Addendum)
A few things to remember from today's visit:  Type 2 diabetes mellitus with other specified complication, with long-term current use of insulin (HCC)  Hyperlipidemia associated with type 2 diabetes mellitus (HCC)  If you need refills for medications you take chronically, please call your pharmacy. Do not use My Chart to request refills or for acute issues that need immediate attention. If you send a my chart message, it may take a few days to be addressed, specially if I am not in the office.  Please be sure medication list is accurate. If a new problem present, please set up appointment sooner than planned today.

## 2022-12-17 NOTE — Assessment & Plan Note (Signed)
Problem has not been well-controlled. Last hemoglobin A1c in 08/2022 was 11.0. He is reporting improvement. Continue Basaglar 35 units daily and Tradjenta 5 mg daily.  Further recommendation will be given according to hemoglobin A1c result. We discussed some of complications of poorly controlled glucose. Stressed the importance of periodic eye exam, dental and foot care.  He is overdue for eye exam, recommend arranging an appointment. Follow-up in 6 months.

## 2022-12-22 ENCOUNTER — Telehealth: Payer: Self-pay

## 2022-12-22 NOTE — Telephone Encounter (Signed)
Insurance will not cover Vascepa unless patient tries and fails 12 weeks of Zetia.

## 2022-12-24 NOTE — Telephone Encounter (Signed)
Does it cover lovaza?  Another options is to start OTC fish oil 1 g bid to help decrease triglycerides. Thanks, BJ

## 2022-12-24 NOTE — Telephone Encounter (Signed)
See my chart encounter.

## 2023-01-05 NOTE — Progress Notes (Signed)
[facility] [address] [place] [address] Phone: [phone]   Assessment and Plan:     1. Chronic bilateral low back pain without sciatica 2. DDD (degenerative disc disease), lumbar 3. Lumbar facet arthropathy 4. Left elbow pain 5. Left lateral epicondylitis  -Chronic with exacerbation, subsequent visit - Low back pain and elbow pain have significantly improved after completing 2-week course of meloxicam.  Left elbow pain was mildly returning, so patient is completing third week of meloxicam. - Recommend completing additional 1 week of meloxicam and then discontinuing medication - May use Tylenol as needed for day-to-day pain relief - Continue with heat for low back and elbow  Pertinent previous records reviewed include none   Follow Up: As needed.  Could consider physical therapy versus advanced imaging versus bracing if no improvement or worsening of symptoms   Subjective:   I, Miguel Sanders, am serving as a Neurosurgeon for Doctor Richardean Sale   Chief Complaint: left elbow and low back pain    HPI:    12/10/2022 Patient is a 47 year old male complaining of left elbow and low back pain. Patient states He was seen on 09/17/22 when he initially reported a few months of left elbow pain. He felt like it was caused by gout, no improvement after increasing dose of Allopurinol.No hx of trauma. states all pain is mild and comes and goes    He took Celebrex 100 mg bid for 10 days,elbow pain improved but did not fully resolve. He mentions that medication caused dry, chapped lips, which subsided after discontinuation. He describes the elbow as swollen with a "knot", [place] exacerbated with certain movement. Decreased grip strength , no numbness or tingling    -For back pain, he experiences intermittent discomfort, particularly severe in the mornings, described as a dull, tender soreness in the right lower back without radiation to the  legs. The pain improves with movement throughout the day, reaching a maximum discomfort level of 5 or 6 out of 10. He expresses concern that the pain might indicate an issue with an "internal organ" serious process. He has not noted gross hematuria, changes in urine frequency,or dysuria. Lumbar spine surgery a few years ago. He has had occasional back pain for a while but the pain he is reporting today is different. Negative for saddle anesthesia, LE numbness/tingling,or bowel/bladder dysfunction.   He has not taken any medication for the back pain due to concerns about liver health.  01/13/2023 Patient states back no problem, elbow pain feels like its starting to come back he is completing a 3rd week of meloxicam     Relevant Historical Information: Elevated BMI, GERD, DM type II poorly controlled Additional pertinent review of systems negative.   Current Outpatient Medications:    allopurinol (ZYLOPRIM) 100 MG tablet, TAKE 1 TABLET(100 MG) BY MOUTH TWICE DAILY, Disp: 180 tablet, Rfl: 1   blood glucose meter kit and supplies KIT, Dispense based on patient and insurance preference. Use up to four times daily as directed., Disp: 1 each, Rfl: 0   gemfibrozil (LOPID) 600 MG tablet, TAKE 1 TABLET(600 MG) BY MOUTH TWICE DAILY BEFORE A MEAL, Disp: 180 tablet, Rfl: 1   icosapent Ethyl (VASCEPA) 1 g capsule, Take 2 capsules (2 g total) by mouth 2 (two) times daily., Disp: 120 capsule, Rfl: 6   Insulin Glargine (BASAGLAR KWIKPEN) 100 UNIT/ML, Inject 40 Units into the skin daily., Disp: 15 mL, Rfl: 1   Insulin  Pen Needle (B-D UF III MINI PEN NEEDLES) 31G X 5 MM MISC, USE WITH SAXENDA DAILY AS DIRECTED, Disp: 100 each, Rfl: 3   meloxicam (MOBIC) 15 MG tablet, Take 1 tablet (15 mg total) by mouth daily., Disp: 30 tablet, Rfl: 0   MITIGARE 0.6 MG CAPS, TAKE 1 TABLET BY MOUTH DAILY AS NEEDED. HOLD ATORVASTAIN WHEN TAKING THIS MEDICATION, Disp: 30 capsule, Rfl: 3   rosuvastatin (CRESTOR) 20 MG tablet, Take 1  tablet (20 mg total) by mouth daily., Disp: 90 tablet, Rfl: 3   TRADJENTA 5 MG TABS tablet, TAKE 1 TABLET(5 MG) BY MOUTH DAILY, Disp: 30 tablet, Rfl: 3   traZODone (DESYREL) 50 MG tablet, TAKE 1 TABLET(50 MG) BY MOUTH AT BEDTIME, Disp: 90 tablet, Rfl: 1   Objective:     Vitals:   01/13/23 1545  Pulse: 96  SpO2: 98%  Weight: (!) 360 lb (163.3 kg)  Height: 6\' 2"  (1.88 m)      Body mass index is 46.22 kg/m.    Physical Exam:     Gen: Appears well, nad, nontoxic and pleasant Psych: Alert and oriented, appropriate mood and affect Neuro: sensation intact, strength is 5/5 in upper and lower extremities, muscle tone wnl Skin: no susupicious lesions or rashes   Back - Normal skin, Spine with normal alignment and no deformity.   No tenderness to vertebral process palpation.   NTTP gluteal musculature Straight leg raise negative Piriformis Test negative      Left elbow: No deformity, swelling or muscle wasting Normal Carrying angle ROM:0-140, supination and pronation 90 NTTP lateral epicondyle NTTP over triceps, ticeps tendon, olecronon, medial epicondyle, antecubital fossa, biceps tendon, supinator, pronator Negative tinnels over cubital tunnel   Mild pain with resisted wrist and middle digit extension No pain with resisted wrist flexion No  pain with resisted supination No pain with resisted pronation Negative valgus stress Negative varus stress    Electronically signed by:  Miguel Sanders [facility] 4:06 PM 01/13/23

## 2023-01-13 ENCOUNTER — Ambulatory Visit: Payer: 59 | Admitting: Sports Medicine

## 2023-01-13 VITALS — HR 96 | Ht 74.0 in | Wt 360.0 lb

## 2023-01-13 DIAGNOSIS — M545 Low back pain, unspecified: Secondary | ICD-10-CM | POA: Diagnosis not present

## 2023-01-13 DIAGNOSIS — M25522 Pain in left elbow: Secondary | ICD-10-CM

## 2023-01-13 DIAGNOSIS — M47816 Spondylosis without myelopathy or radiculopathy, lumbar region: Secondary | ICD-10-CM | POA: Diagnosis not present

## 2023-01-13 DIAGNOSIS — M5136 Other intervertebral disc degeneration, lumbar region: Secondary | ICD-10-CM | POA: Diagnosis not present

## 2023-01-13 DIAGNOSIS — G8929 Other chronic pain: Secondary | ICD-10-CM

## 2023-01-13 DIAGNOSIS — M7712 Lateral epicondylitis, left elbow: Secondary | ICD-10-CM

## 2023-01-17 ENCOUNTER — Ambulatory Visit: Payer: 59 | Admitting: Family Medicine

## 2023-01-17 ENCOUNTER — Encounter: Payer: Self-pay | Admitting: Family Medicine

## 2023-01-17 VITALS — BP 126/70 | HR 81 | Temp 98.5°F | Ht 74.0 in | Wt 359.7 lb

## 2023-01-17 DIAGNOSIS — A084 Viral intestinal infection, unspecified: Secondary | ICD-10-CM | POA: Diagnosis not present

## 2023-01-17 NOTE — Progress Notes (Signed)
Established Patient Office Visit  Subjective   Patient ID: Miguel Sanders, male    DOB: 01/23/1976  Age: 47 y.o. MRN: [id number]  Chief Complaint  Patient presents with   Nausea    Patient complains of nausea, X1 days    Diarrhea    Patient complains of diarrhea, X2 days   Fatigue    Patient complains of fatigue,    Headache    Patient complains of headaches, Tried Ibuprofen and Tylenol   Dizziness    Patient complains of dizziness, x2 days     HPI   Miguel Sanders seen as a work in with onset late Saturday of some nausea without vomiting, nonbloody diarrhea, fatigue, and intermittent headache.  He states he went to a graduation Saturday and wonders if he may have picked up some kind of bug at that time.  His headaches do improve with Tylenol.  No recent tick bites.  No documented fever.  He had some nausea without vomiting.  Diarrhea usually 3-4 times per day.  Keeping down fluids and food.  Somewhat poor appetite.  Blood sugars overall fairly stable.  He mild diffuse abdominal cramps.  Past Medical History:  Diagnosis Date   Diabetes mellitus without complication (HCC)    GERD (gastroesophageal reflux disease)    Gout    Hx of adenomatous colonic polyps 08/31/2022   08/2022 - 3 polyps max 8 mm 1 TV adenoma + 2 TA's recall 2027   Hyperlipidemia    Hypertension    MO MED'S   Obesity, Class III, BMI 40-49.9 (morbid obesity) (HCC)    Sleep apnea    NO C PAP   Past Surgical History:  Procedure Laterality Date   BACK SURGERY     SPINE SURGERY      reports that he quit smoking about 13 years ago. His smoking use included cigarettes. He has a 15.00 pack-year smoking history. He has been exposed to tobacco smoke. He has never used smokeless tobacco. He reports current alcohol use. He reports that he does not use drugs. family history includes Arthritis in his mother; Cancer in an other family member; Colon cancer (age of onset: 61) in his maternal grandfather; Hypertension in an  other family member. No Known Allergies  Review of Systems  Constitutional:  Positive for malaise/fatigue. Negative for chills and fever.  Gastrointestinal:  Positive for diarrhea and nausea. Negative for blood in stool, constipation, melena and vomiting.      Objective:     BP 126/70 (BP Location: Left Arm, Patient Position: Sitting, Cuff Size: Large)   Pulse 81   Temp 98.5 F (36.9 C) (Oral)   Ht 6\' 2"  (1.88 m)   Wt (!) 359 lb 11.2 oz (163.2 kg)   SpO2 98%   BMI 46.18 kg/m  BP Readings from Last 3 Encounters:  01/17/23 126/70  12/17/22 118/80  12/10/22 (!) 140/82   Wt Readings from Last 3 Encounters:  01/17/23 (!) 359 lb 11.2 oz (163.2 kg)  01/13/23 (!) 360 lb (163.3 kg)  12/17/22 (!) 358 lb 6 oz (162.6 kg)      Physical Exam Vitals reviewed.  Constitutional:      General: He is not in acute distress.    Appearance: He is not ill-appearing.  HENT:     Mouth/Throat:     Mouth: Mucous membranes are moist.     Pharynx: Oropharynx is clear.  Cardiovascular:     Rate and Rhythm: Normal rate and regular rhythm.  Pulmonary:  Effort: Pulmonary effort is normal.     Breath sounds: Normal breath sounds. No wheezing or rales.  Abdominal:     General: Bowel sounds are normal. There is no distension.     Palpations: Abdomen is soft.     Tenderness: There is no abdominal tenderness. There is no guarding.  Neurological:     Mental Status: He is alert.      No results found for any visits on 01/17/23.    The 10-year ASCVD risk score (Arnett DK, et al., 2019) is: 4.3%    Assessment & Plan:   Probable gastroenteritis.  Nonfocal exam.  Patient nontoxic.  Nonacute abdomen.  Does not appear dehydrated at this time.  -Work note written for today -Handout given on appropriate diet for diarrhea -Follow-up for any fever, abdominal pain, or other concerns -Continue to monitor blood sugars closely -Stressed importance of good hydration and electrolyte  replacement  No follow-ups on file.    Evelena Peat, MD

## 2023-02-21 ENCOUNTER — Other Ambulatory Visit: Payer: Self-pay | Admitting: Family Medicine

## 2023-02-21 DIAGNOSIS — E1169 Type 2 diabetes mellitus with other specified complication: Secondary | ICD-10-CM

## 2023-03-07 ENCOUNTER — Other Ambulatory Visit: Payer: Self-pay | Admitting: Family Medicine

## 2023-03-19 IMAGING — DX DG CHEST 1V PORT
2 series · 2 of 2 positions shown · non-contrast
Comparison: 07/25/2009 scattered 18137 chest radiograph, report
only.

CLINICAL DATA: n/vNausea x 4 days with vomiting that started this
am.

EXAM:
PORTABLE CHEST 1 VIEW

[chest ap (1 of 2)]
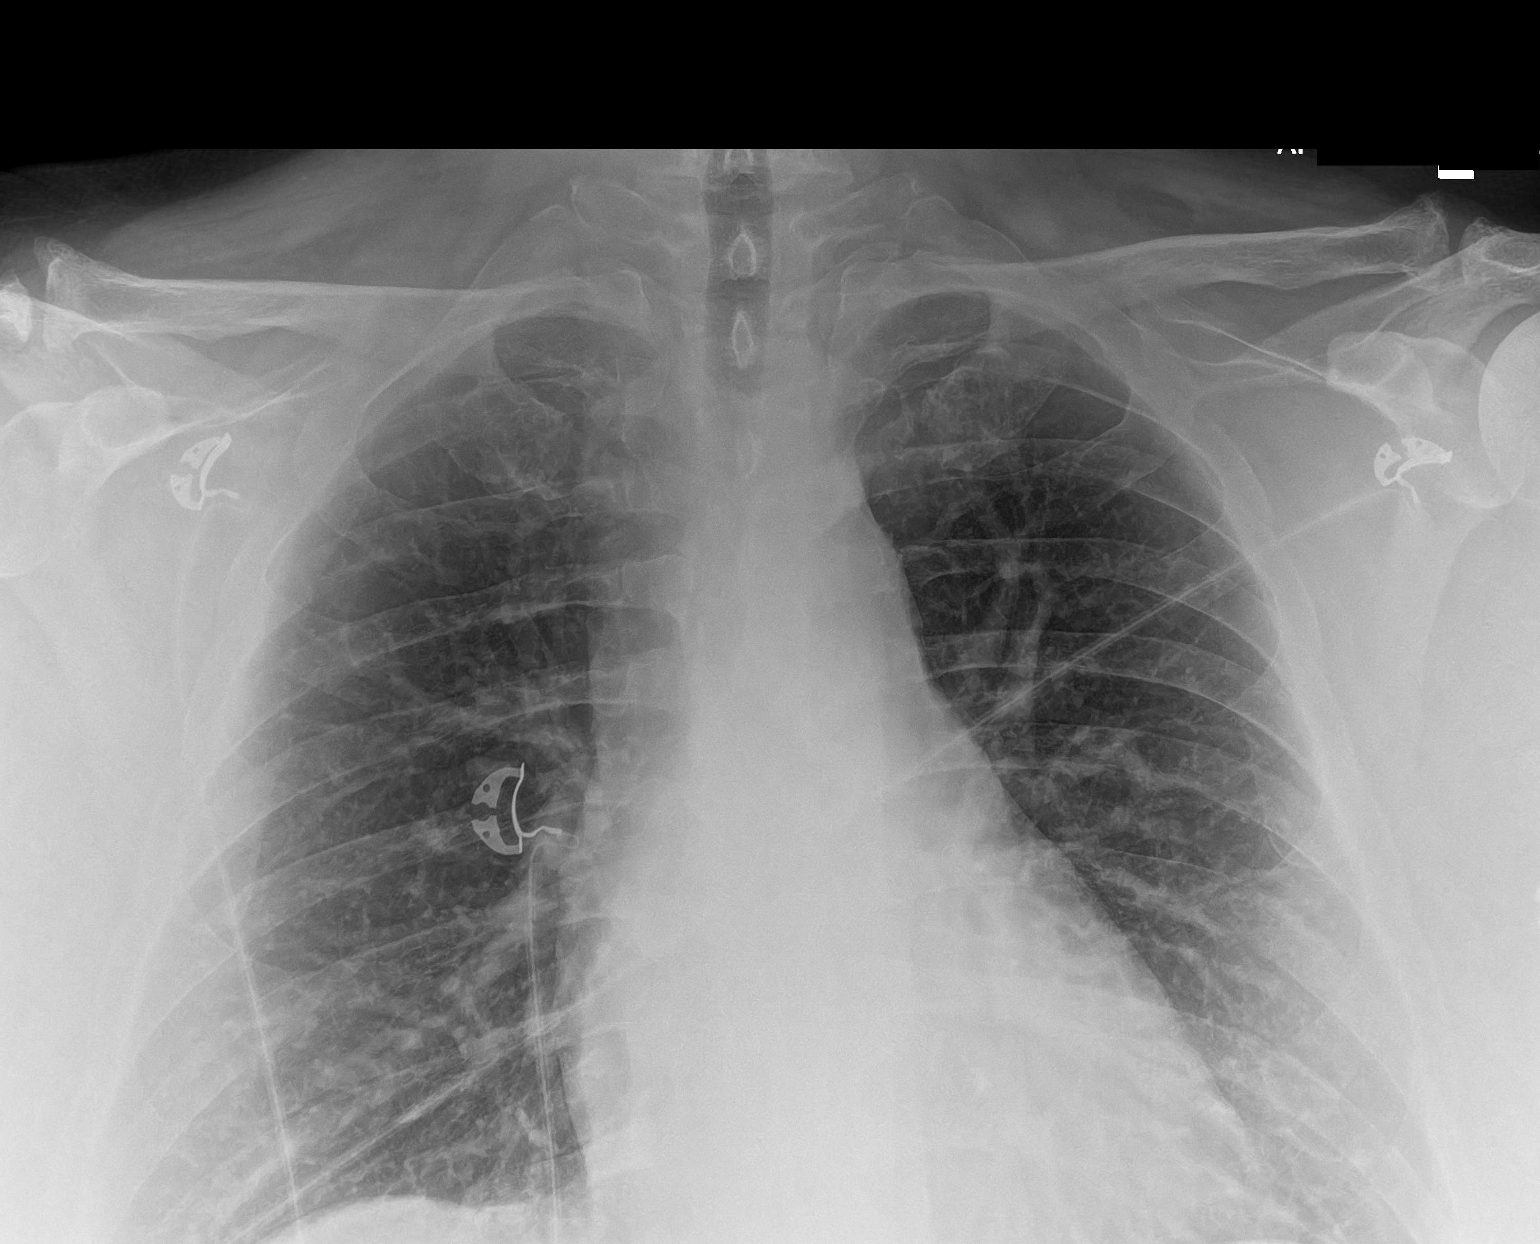

[chest ap (2 of 2)]
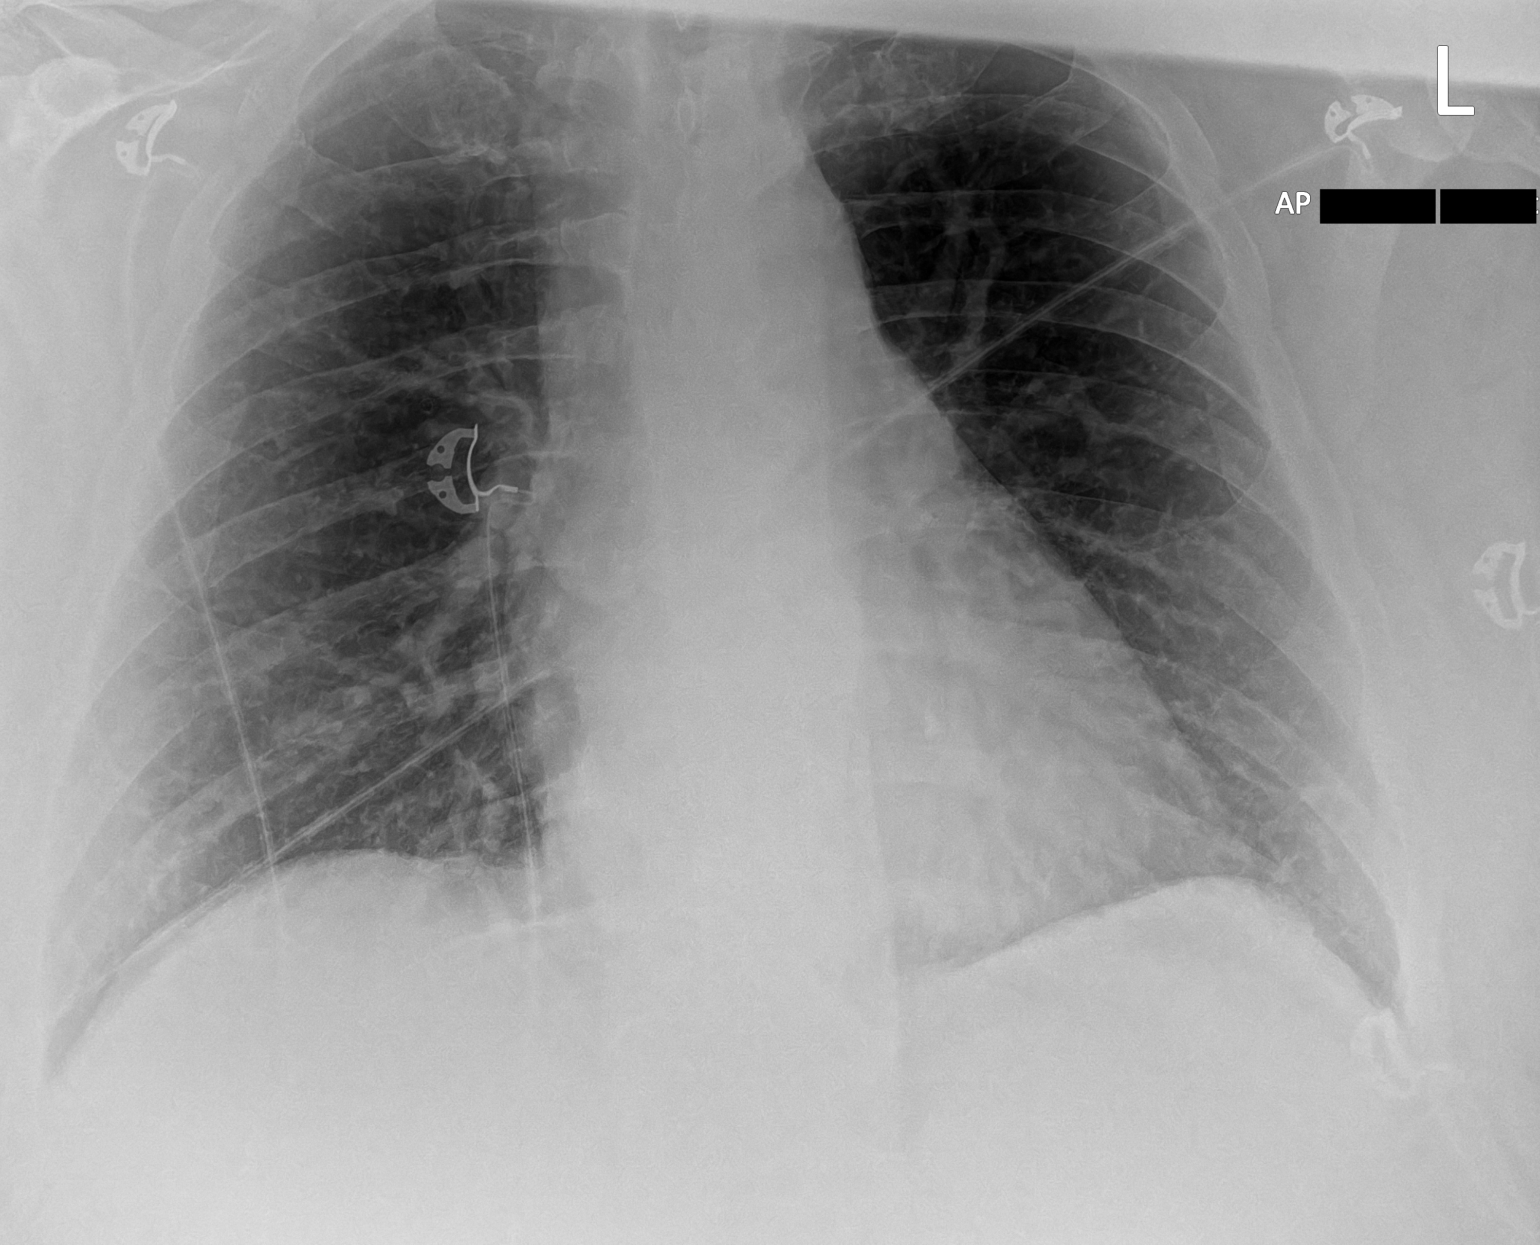

[2 of 2 positions shown; findings below may reference images not displayed]

FINDINGS: Two frontal views of the chest. Midline trachea. Normal heart size
and mediastinal contours. The right costophrenic angle is minimally
excluded. No pleural effusion or pneumothorax. Diffuse peribronchial
thickening. Clear lungs. No free intraperitoneal air.
IMPRESSION: No active disease.

## 2023-03-19 IMAGING — CT CT ABD-PELV W/ CM
2 of 4 series · 16 of 46 positions shown, 18 images · IV contrast (agent unspecified)
Comparison: 04/21/2011

CLINICAL DATA: Abdominal pain with nausea vomiting.

EXAM:
CT ABDOMEN AND PELVIS WITH CONTRAST
TECHNIQUE: Multidetector CT imaging of the abdomen and pelvis was performed
using the standard protocol following bolus administration of
intravenous contrast.

[Series 2: abd pel w · axial · 0.98mm/px · z∈[-467,-22]mm · 13 of 99 slices shown, 15 images]
[im 5/99  soft-tissue]
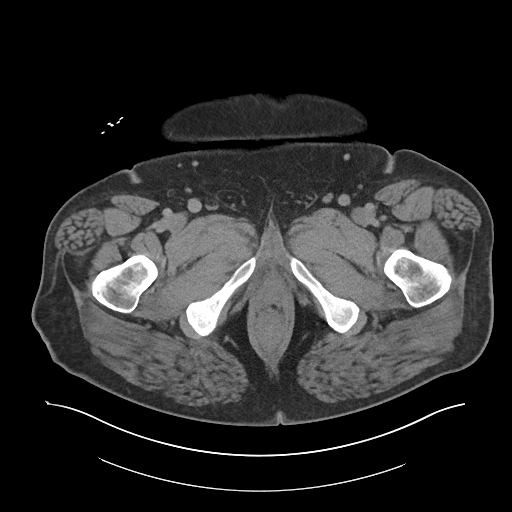
[im 5/99  bone]
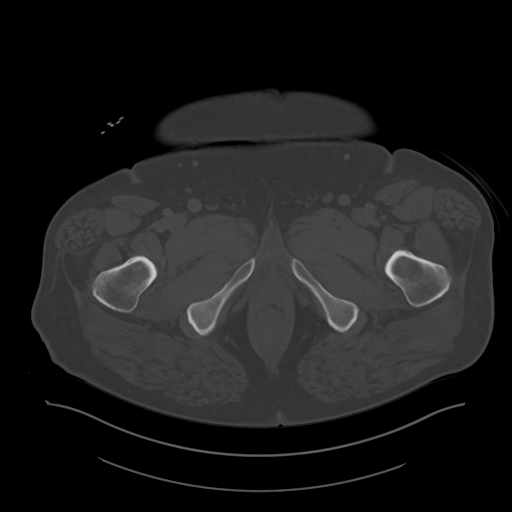
[im 13/99  soft-tissue]
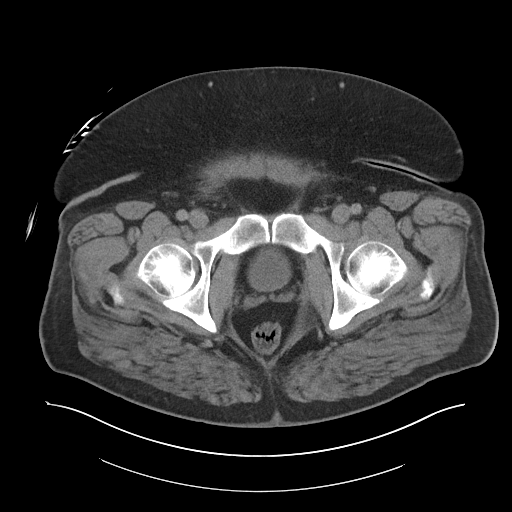
[im 21/99  soft-tissue]
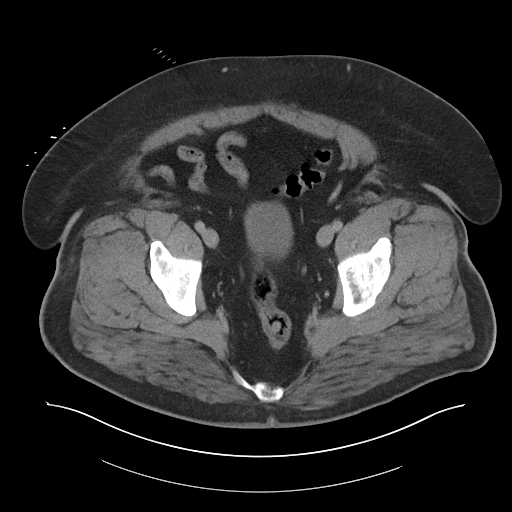
[im 29/99  soft-tissue]
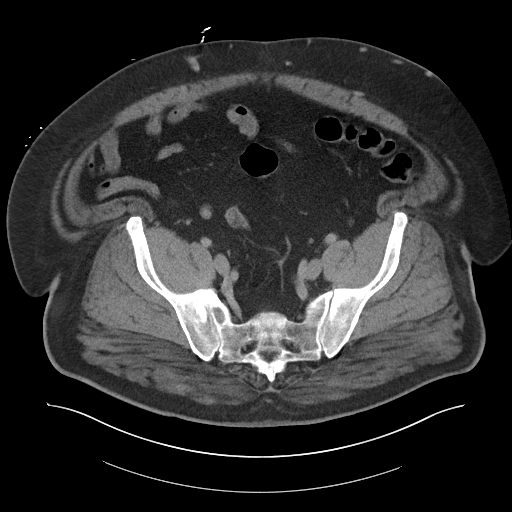
[im 33/99  soft-tissue]
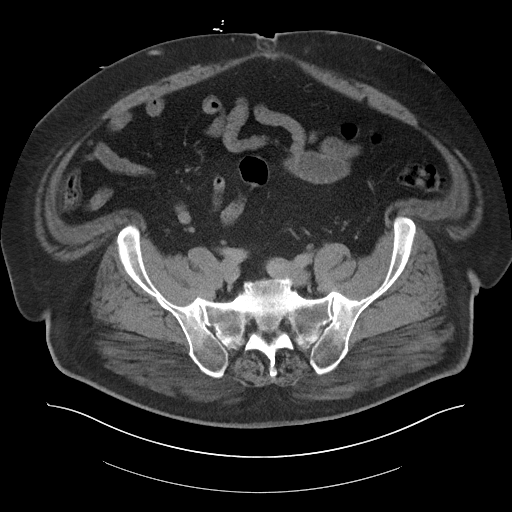
[im 41/99  soft-tissue]
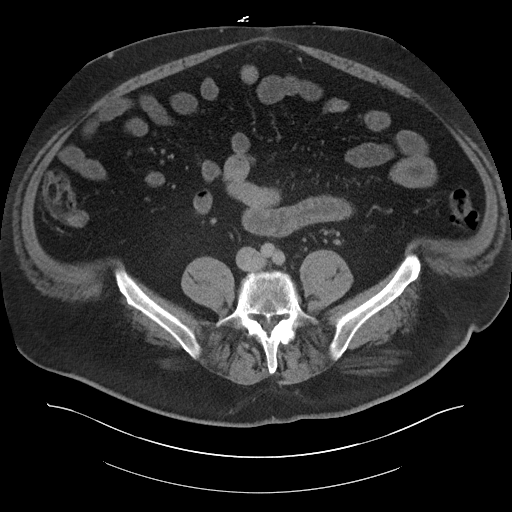
[im 50/99  soft-tissue]
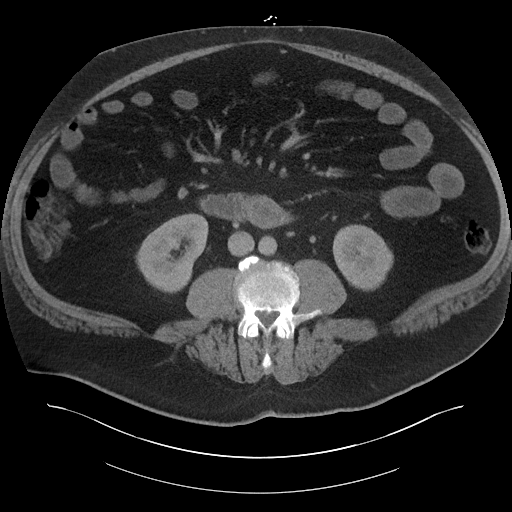
[im 58/99  soft-tissue]
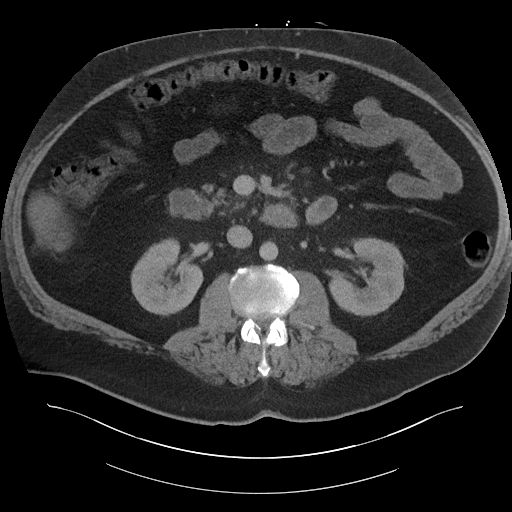
[im 66/99  soft-tissue]
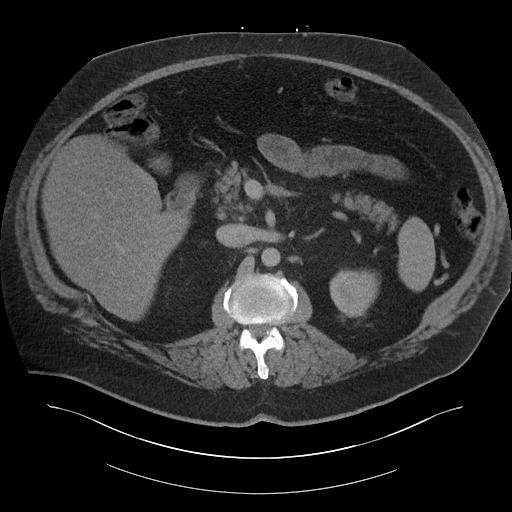
[im 66/99  bone]
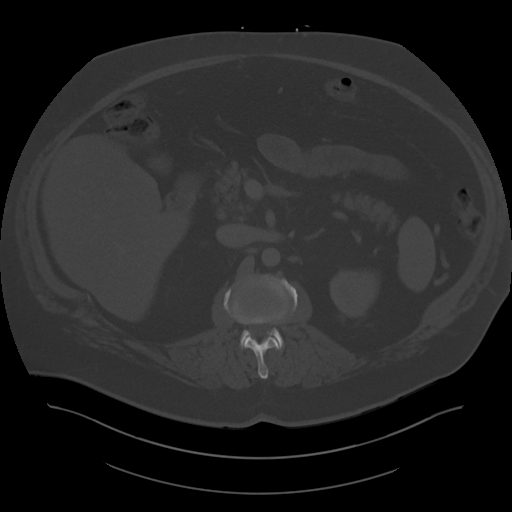
[im 70/99  soft-tissue]
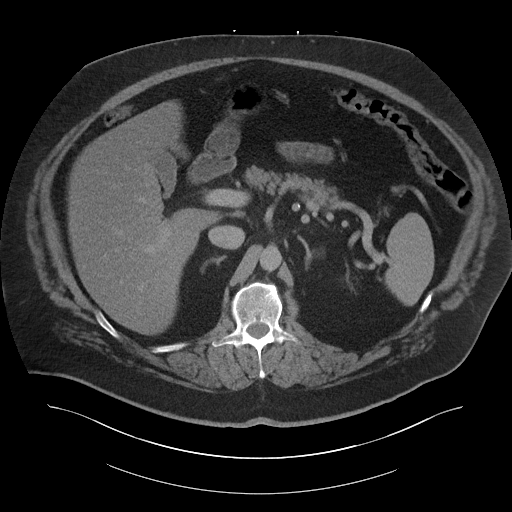
[im 78/99  soft-tissue]
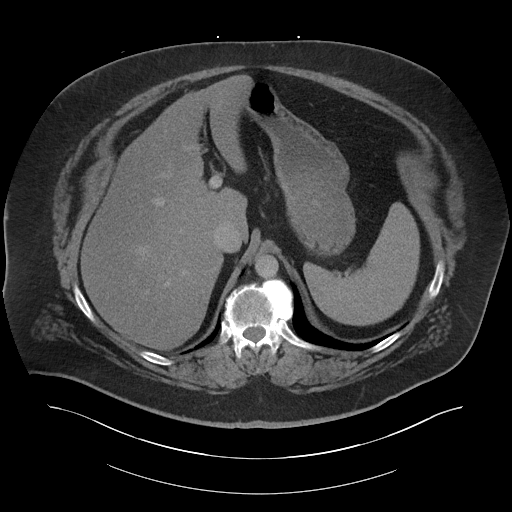
[im 86/99  soft-tissue]
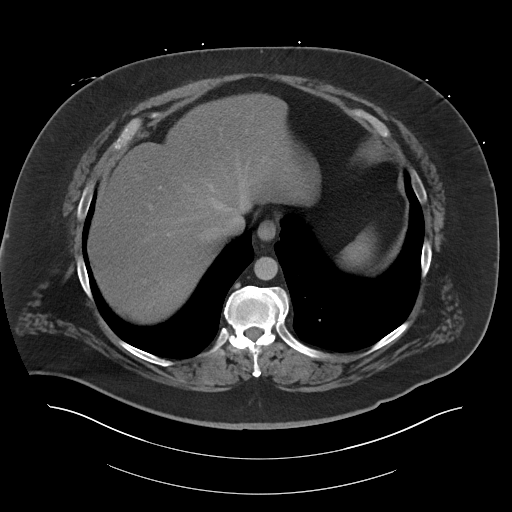
[im 94/99  soft-tissue]
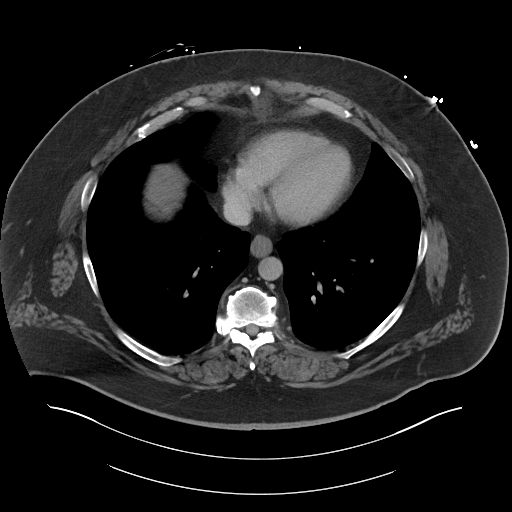

[Series 5: coronal · coronal · 0.99mm/px · 3 of 134 slices shown]
[im 45/134  soft-tissue]
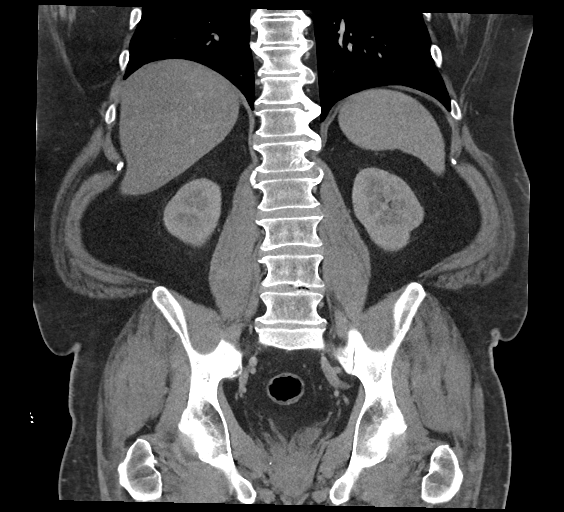
[im 60/134  soft-tissue]
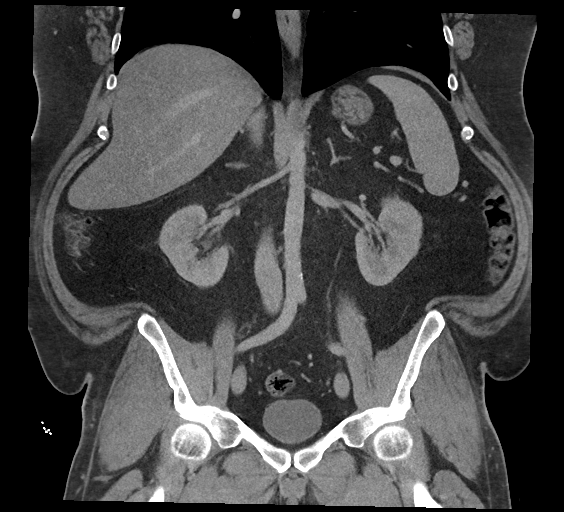
[im 74/134  soft-tissue]
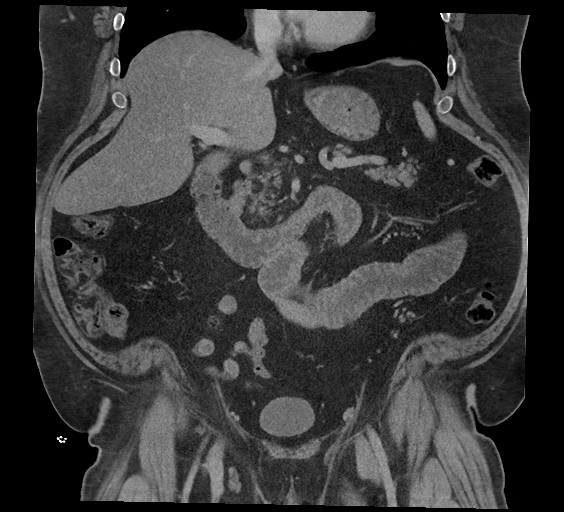

[16 of 46 positions shown; findings below may reference images not displayed]

RADIATION DOSE REDUCTION: This exam was performed according to the
departmental dose-optimization program which includes automated
exposure control, adjustment of the mA and/or kV according to
patient size and/or use of iterative reconstruction technique.

CONTRAST:  100mL OMNIPAQUE IOHEXOL 300 MG/ML  SOLN
FINDINGS: Lower chest: Unremarkable.

Hepatobiliary: No suspicious focal abnormality within the liver
parenchyma. The liver shows diffusely decreased attenuation
suggesting fat deposition. There is no evidence for gallstones,
gallbladder wall thickening, or pericholecystic fluid. No
intrahepatic or extrahepatic biliary dilation.

Pancreas: No focal mass lesion. No dilatation of the main duct. No
intraparenchymal cyst. No peripancreatic edema.

Spleen: No splenomegaly. No focal mass lesion.

Adrenals/Urinary Tract: No adrenal nodule or mass. Kidneys
unremarkable. No evidence for hydroureter. The urinary bladder
appears normal for the degree of distention.

Stomach/Bowel: Stomach is unremarkable. No gastric wall thickening.
No evidence of outlet obstruction. Duodenum is normally positioned
as is the ligament of Treitz. No small bowel wall thickening. No
small bowel dilatation. The terminal ileum is normal. The appendix
is normal. No gross colonic mass. No colonic wall thickening.

Vascular/Lymphatic: There is mild atherosclerotic calcification of
the abdominal aorta without aneurysm. There is no gastrohepatic or
hepatoduodenal ligament lymphadenopathy. No retroperitoneal or
mesenteric lymphadenopathy. No pelvic sidewall lymphadenopathy.

Reproductive: The prostate gland and seminal vesicles are
unremarkable.

Other: No intraperitoneal free fluid.

Musculoskeletal: No worrisome lytic or sclerotic osseous
abnormality.
IMPRESSION: 1. No acute findings in the abdomen or pelvis.
2. Hepatic steatosis.
3. Aortic Atherosclerosis ([id number].P).

## 2023-07-23 ENCOUNTER — Other Ambulatory Visit: Payer: Self-pay | Admitting: Family Medicine

## 2023-08-20 ENCOUNTER — Other Ambulatory Visit: Payer: Self-pay | Admitting: Family Medicine

## 2023-08-20 DIAGNOSIS — E1169 Type 2 diabetes mellitus with other specified complication: Secondary | ICD-10-CM

## 2023-08-22 NOTE — Progress Notes (Signed)
 ACUTE VISIT Chief Complaint  Patient presents with   boil    Had about 2-3 months ago and it went away. Came back about a week or so ago, hard, painful, red. It has drained, a lot of pus has come out along with blood, still has some yellow discharge coming out.    Depression    Has been feeling depressed    HPI: Miguel Sanders is a 48 y.o. male with a PMHx significant for DM II, HLD, aortic atherosclerosis, and gout, who is here today complaining of a boil as described above.  Patient complains of lesion on his left groin intermittently for about 2-3 months. On 1/8, he drained it and had significant pus and blood. He felt ill and that day and believes he had a fever, did not check temp. In general, it has been better since then, but he has continued to have small amount of yellowish and bloody discharge since then, and he says the boil is still hard to the touch.  He has had a similar problem in the past that wasn't as bad.  He has taken some ibuprofen for the pain.   Depression:  Patient also complains of feeling down, depressed, and fatigued. This has been a stable problem for some time.   He reports he is having some sleeping difficulty, and sleeping ~6 hours per night. He has taken trazodone  for sleep in the past, did not help. Racing thoughts.  His friends had recommended he ask about a B12 shot.  He has not taken medication for depression before.  Denies SI.     08/23/2023    7:09 AM 08/30/2022    7:35 AM 02/05/2022    7:33 AM 12/14/2021   10:29 AM 10/08/2020    7:35 AM  Depression screen PHQ 2/9  Decreased Interest 2 0 0 0 0  Down, Depressed, Hopeless 2 0 0 0 0  PHQ - 2 Score 4 0 0 0 0  Altered sleeping 2 3     Tired, decreased energy  3     Change in appetite  2     Feeling bad or failure about yourself   0     Trouble concentrating  2     Moving slowly or fidgety/restless  0     Suicidal thoughts  0     PHQ-9 Score 6 10     Difficult doing work/chores   Somewhat difficult       Diabetes Mellitus II: Diagnosed in 2012.  - Checking BG at home: He has been checking his blood sugar regularly and says it is normally in the 230s.  - Medications: Currently on basaglar  40 units daily and Tradjenta  5 mg daily. He had been on Victoza  in the past but his health insurance stopped coverage. - Diet: He has tried to cut some of the sweets out of his diet.  - He hasn't had an eye exam for awhile.  - Negative for symptoms of hypoglycemia, polyuria, polydipsia, foot ulcers/trauma Feet tingling/numbness.  Lab Results  Component Value Date   HGBA1C 8.7 (H) 12/17/2022   Lab Results  Component Value Date   MICROALBUR 6.6 (H) 12/17/2022   Hyperlipidemia: Currently on Lopid  600 mg bid and rosuvastatin  20 mg daily.  Side effects from medication: none Lab Results  Component Value Date   CHOL 133 12/17/2022   HDL 25.90 (L) 12/17/2022   LDLCALC  05/14/2020     Comment:     . LDL  cholesterol not calculated. Triglyceride levels greater than 400 mg/dL invalidate calculated LDL results. . Reference range: <100 . Desirable range <100 mg/dL for primary prevention;   <70 mg/dL for patients with CHD or diabetic patients  with > or = 2 CHD risk factors. SABRA LDL-C is now calculated using the Martin-Hopkins  calculation, which is a validated novel method providing  better accuracy than the Friedewald equation in the  estimation of LDL-C.  Gladis APPLETHWAITE et al. SANDREA. 7986;689(80): 2061-2068  (http://education.[facility].com/faq/FAQ164)    LDLDIRECT 60.0 12/17/2022   TRIG (H) 12/17/2022    445.0 Triglyceride is over 400; calculations on Lipids are invalid.   CHOLHDL 5 12/17/2022   Review of Systems  Constitutional:  Positive for fatigue. Negative for activity change, appetite change and fever.  HENT:  Negative for sore throat.   Eyes:  Negative for redness and visual disturbance.  Respiratory:  Negative for cough, shortness of breath and wheezing.    Cardiovascular:  Negative for chest pain, palpitations and leg swelling.  Gastrointestinal:  Negative for abdominal pain, nausea and vomiting.  Genitourinary:  Negative for decreased urine volume, dysuria and hematuria.  Neurological:  Negative for syncope, weakness and headaches.  Psychiatric/Behavioral:  Positive for sleep disturbance. Negative for confusion and hallucinations.   See other pertinent positives and negatives in HPI.  Current Outpatient Medications on File Prior to Visit  Medication Sig Dispense Refill   allopurinol  (ZYLOPRIM ) 100 MG tablet TAKE 1 TABLET(100 MG) BY MOUTH TWICE DAILY 180 tablet 1   blood glucose meter kit and supplies KIT Dispense based on patient and insurance preference. Use up to four times daily as directed. 1 each 0   gemfibrozil  (LOPID ) 600 MG tablet TAKE 1 TABLET(600 MG) BY MOUTH TWICE DAILY BEFORE A MEAL 180 tablet 1   icosapent  Ethyl (VASCEPA ) 1 g capsule Take 2 capsules (2 g total) by mouth 2 (two) times daily. 120 capsule 6   Insulin  Glargine (BASAGLAR  KWIKPEN) 100 UNIT/ML ADMINISTER 25 UNITS UNDER THE SKIN DAILY. 15 mL 0   Insulin  Pen Needle (B-D UF III MINI PEN NEEDLES) 31G X 5 MM MISC USE 1 PEN NEEDLE WITH SAXENDA  DAILY AS DIRECTED 100 each 3   meloxicam  (MOBIC ) 15 MG tablet Take 1 tablet (15 mg total) by mouth daily. 30 tablet 0   MITIGARE  0.6 MG CAPS TAKE 1 TABLET BY MOUTH DAILY AS NEEDED. HOLD ATORVASTAIN WHEN TAKING THIS MEDICATION 30 capsule 3   rosuvastatin  (CRESTOR ) 20 MG tablet TAKE 1 TABLET(20 MG) BY MOUTH DAILY 90 tablet 3   TRADJENTA  5 MG TABS tablet TAKE 1 TABLET(5 MG) BY MOUTH DAILY 30 tablet 3   traZODone  (DESYREL ) 50 MG tablet TAKE 1 TABLET(50 MG) BY MOUTH AT BEDTIME 90 tablet 1   No current facility-administered medications on file prior to visit.    Past Medical History:  Diagnosis Date   Diabetes mellitus without complication (HCC)    GERD (gastroesophageal reflux disease)    Gout    Hx of adenomatous colonic polyps  08/31/2022   08/2022 - 3 polyps max 8 mm 1 TV adenoma + 2 TA's recall 2027   Hyperlipidemia    Hypertension    MO MED'S   Obesity, Class III, BMI 40-49.9 (morbid obesity) (HCC)    Sleep apnea    NO C PAP   No Known Allergies  Social History   Socioeconomic History   Marital status: Married    Spouse name: Not on file   Number of children: 2  Years of education: Not on file   Highest education level: Not on file  Occupational History   Occupation: unemployed  Tobacco Use   Smoking status: Former    Current packs/day: 0.00    Average packs/day: 1.5 packs/day for 10.0 years (15.0 ttl pk-yrs)    Types: Cigarettes    Start date: 08/10/1999    Quit date: 08/09/2009    Years since quitting: 14.0    Passive exposure: Past   Smokeless tobacco: Never  Vaping Use   Vaping status: Never Used  Substance and Sexual Activity   Alcohol use: Yes    Comment: OCCASSIONALY   Drug use: No   Sexual activity: Yes  Other Topics Concern   Not on file  Social History Narrative   Marital status: married x 5 years. Separated from wife in 08/2012.     Children: 2 children from previous marriage; gets children every two weeks; sees more frequently.     Lives: alone; separated from wife in 08/2012.     Employment: Conservator, [employer].      Tobacco: quit; smoked x 13 years.      Alcohol: once every three months.      Drugs: none now; marijuana in past.      Exercise: started going to gym in 08/2012; going 4-5 days per week; elliptical and stationary bike.            Social Drivers of Health   Financial Resource Strain: Patient Declined (09/16/2022)   Overall Financial Resource Strain (CARDIA)    Difficulty of Paying Living Expenses: Patient declined  Food Insecurity: Patient Declined (09/16/2022)   Hunger Vital Sign    Worried About Running Out of Food in the Last Year: Patient declined    Ran Out of Food in the Last Year: Patient declined  Transportation Needs: No Transportation Needs (09/16/2022)    PRAPARE - Administrator, Civil Service (Medical): No    Lack of Transportation (Non-Medical): No  Physical Activity: Unknown (09/16/2022)   Exercise Vital Sign    Days of Exercise per Week: 0 days    Minutes of Exercise per Session: Not on file  Stress: No Stress Concern Present (09/16/2022)   [employer] of Occupational Health - Occupational Stress Questionnaire    Feeling of Stress : Only a little  Social Connections: Unknown (09/16/2022)   Social Connection and Isolation Panel [NHANES]    Frequency of Communication with Friends and Family: Not on file    Frequency of Social Gatherings with Friends and Family: Not on file    Attends Religious Services: Not on file    Active Member of Clubs or Organizations: Patient declined    Attends Banker Meetings: Not on file    Marital Status: Not on file   Vitals:   08/23/23 0702  BP: 120/80  Pulse: 80  Resp: 16  Temp: 98.5 F (36.9 C)  SpO2: 99%   Wt Readings from Last 3 Encounters:  08/23/23 (!) 349 lb (158.3 kg)  01/17/23 (!) 359 lb 11.2 oz (163.2 kg)  01/13/23 (!) 360 lb (163.3 kg)   Body mass index is 44.81 kg/m.  Physical Exam Vitals and nursing note reviewed.  Constitutional:      General: He is not in acute distress.    Appearance: He is well-developed.  HENT:     Head: Normocephalic and atraumatic.  Eyes:     Conjunctiva/sclera: Conjunctivae normal.  Cardiovascular:     Rate and Rhythm: Normal  rate and regular rhythm.     Heart sounds: No murmur heard.    Comments: Posterior tibial pulses palpable Pulmonary:     Effort: Pulmonary effort is normal. No respiratory distress.     Breath sounds: Normal breath sounds.  Abdominal:     Palpations: Abdomen is soft. There is no mass.     Tenderness: There is no abdominal tenderness.  Musculoskeletal:     Right lower leg: 1+ Edema present.     Left lower leg: 1+ Edema present.  Lymphadenopathy:     Cervical: No cervical adenopathy.  Skin:     General: Skin is warm.     Findings: No rash.     Comments: Left inguinal area 1.2 cm erythematous and indurated area, not tender, + local heat, no active drainage.  Neurological:     General: No focal deficit present.     Mental Status: He is alert and oriented to person, place, and time.     Cranial Nerves: No cranial nerve deficit.     Gait: Gait normal.  Psychiatric:     Comments: Well groomed, good eye contact.   ASSESSMENT AND PLAN:  Mr. Mccaskey was seen today for a boil between his legs.   Lab Results  Component Value Date   NA 138 08/23/2023   CL 102 08/23/2023   K 3.8 08/23/2023   CO2 26 08/23/2023   BUN 11 08/23/2023   CREATININE 0.77 08/23/2023   GFR 107.44 08/23/2023   CALCIUM  10.0 08/23/2023   ALBUMIN 4.3 08/23/2023   GLUCOSE 311 (H) 08/23/2023   Lab Results  Component Value Date   ALT 37 08/23/2023   AST 19 08/23/2023   ALKPHOS 89 08/23/2023   BILITOT 0.6 08/23/2023   Lab Results  Component Value Date   VITAMINB12 393 08/23/2023   Lab Results  Component Value Date   VD25OH 21.43 (L) 08/23/2023   Lab Results  Component Value Date   MICROALBUR 63.6 (H) 08/23/2023   MICROALBUR 6.6 (H) 12/17/2022   Lab Results  Component Value Date   TSH 2.10 08/23/2023   Lab Results  Component Value Date   CHOL 139 08/23/2023   HDL 30.20 (L) 08/23/2023   LDLCALC 45 08/23/2023   LDLDIRECT 60.0 12/17/2022   TRIG 316.0 (H) 08/23/2023   CHOLHDL 5 08/23/2023   Abscess of groin, left It has improved after drained at home. Warm compresses. BS needs to be better controlled. Bactrim  DS bid x 7 d. Instructed about warning signs.  -     Sulfamethoxazole -Trimethoprim ; Take 1 tablet by mouth 2 (two) times daily for 7 days.  Dispense: 14 tablet; Refill: 0  Type 2 diabetes mellitus with other specified complication, with long-term current use of insulin  [facility]) Assessment & Plan: Problem is not well controlled. Last HgA1C was 8.7 in 12/2022 and reporting BS's  200's. Continue Basaglar  40 units daily and Tradjenta  5 mg daily.   Further recommendation will be given according to hemoglobin A1c result. Stressed the importance of periodic eye exam, dental and foot care.   Overdue for eye exam.  Will try to get patient assistance to approve GLP-1 agonist.  Orders: -     Comprehensive metabolic panel; Future -     Microalbumin / creatinine urine ratio; Future  Tingling of both feet Most likely peripheral neuropathy. He is interested in B12 injection, will check B12 and given recommendations accordingly.  -     VITAMIN D  25 Hydroxy (Vit-D Deficiency, Fractures); Future -  Vitamin B12; Future  Hypertriglyceridemia Assessment & Plan: Continue rosuvastatin  20 mg daily and Lopid  600 mg bid. Low-fat diet was recommended. Further recommendation will be given according to lipid panel result  Orders: -     Lipid panel; Future  Insomnia, unspecified type Assessment & Plan: Trazodone  did not help. For now good sleep hygiene recommended. Will consider a different treatment if not improve with depression treatment.  Orders: -     TSH; Future  Depression, major, recurrent, mild (HCC) Assessment & Plan: Problem has been going on for a few years, stable overall. He would like to try medication. He tried Trazodone  in the past for sleep, did not feel like it helped with mood. Wellbutrin  XL 150 mg daily started today. F/U in 6 weeks , before if needed.  Orders: -     VITAMIN D  25 Hydroxy (Vit-D Deficiency, Fractures); Future -     buPROPion  HCl ER (XL); Take 1 tablet (150 mg total) by mouth daily.  Dispense: 30 tablet; Refill: 2  Morbid obesity with BMI of 40.0-44.9, adult [facility]) Assessment & Plan: Has lost about 10 Lb since 01/2023, poorly controlled DM II could be a contributing factor. Patient understands the benefits of wt loss as well as adverse effects of obesity. Consistency with healthy diet and physical activity encouraged.   Return  in about 6 weeks (around 10/04/2023).  I spent a total of 41 minutes in both face to face and non face to face activities for this visit on the date of this encounter. During this time history was obtained and documented, examination was performed, prior labs reviewed, and assessment/plan discussed.  I, Leonce PARAS Wierda, acting as a scribe for Ardian Haberland, MD., have documented all relevant documentation on the behalf of Miguel Herbert, MD, as directed by  Stanlee Roehrig, MD while in the presence of Henri Guedes, MD.   I, Keyon Liller, MD, have reviewed all documentation for this visit. The documentation on 08/23/23 for the exam, diagnosis, procedures, and orders are all accurate and complete.  Salome Hautala G. Yittel Emrich, MD  [facility]. [place] office.

## 2023-08-23 ENCOUNTER — Encounter: Payer: Self-pay | Admitting: Family Medicine

## 2023-08-23 ENCOUNTER — Ambulatory Visit: Payer: 59 | Admitting: Family Medicine

## 2023-08-23 VITALS — BP 120/80 | HR 80 | Temp 98.5°F | Resp 16 | Ht 74.0 in | Wt 349.0 lb

## 2023-08-23 DIAGNOSIS — R202 Paresthesia of skin: Secondary | ICD-10-CM

## 2023-08-23 DIAGNOSIS — Z794 Long term (current) use of insulin: Secondary | ICD-10-CM

## 2023-08-23 DIAGNOSIS — E781 Pure hyperglyceridemia: Secondary | ICD-10-CM

## 2023-08-23 DIAGNOSIS — E1169 Type 2 diabetes mellitus with other specified complication: Secondary | ICD-10-CM | POA: Diagnosis not present

## 2023-08-23 DIAGNOSIS — L02214 Cutaneous abscess of groin: Secondary | ICD-10-CM | POA: Diagnosis not present

## 2023-08-23 DIAGNOSIS — G4700 Insomnia, unspecified: Secondary | ICD-10-CM

## 2023-08-23 DIAGNOSIS — F330 Major depressive disorder, recurrent, mild: Secondary | ICD-10-CM

## 2023-08-23 DIAGNOSIS — Z6841 Body Mass Index (BMI) 40.0 and over, adult: Secondary | ICD-10-CM

## 2023-08-23 LAB — COMPREHENSIVE METABOLIC PANEL
ALT: 37 U/L (ref 0–53)
AST: 19 U/L (ref 0–37)
Albumin: 4.3 g/dL (ref 3.5–5.2)
Alkaline Phosphatase: 89 U/L (ref 39–117)
BUN: 11 mg/dL (ref 6–23)
CO2: 26 meq/L (ref 19–32)
Calcium: 10 mg/dL (ref 8.4–10.5)
Chloride: 102 meq/L (ref 96–112)
Creatinine, Ser: 0.77 mg/dL (ref 0.40–1.50)
GFR: 107.44 mL/min (ref >60.00)
Glucose, Bld: 311 mg/dL — ABNORMAL HIGH (ref 70–99)
Potassium: 3.8 meq/L (ref 3.5–5.1)
Sodium: 138 meq/L (ref 135–145)
Total Bilirubin: 0.6 mg/dL (ref 0.2–1.2)
Total Protein: 7.3 g/dL (ref 6.0–8.3)

## 2023-08-23 LAB — LIPID PANEL
Cholesterol: 139 mg/dL (ref 0–200)
HDL: 30.2 mg/dL — ABNORMAL LOW (ref >39.00)
LDL Cholesterol: 45 mg/dL (ref 0–99)
NonHDL: 108.4
Total CHOL/HDL Ratio: 5
Triglycerides: 316 mg/dL — ABNORMAL HIGH (ref 0.0–149.0)
VLDL: 63.2 mg/dL — ABNORMAL HIGH (ref 0.0–40.0)

## 2023-08-23 LAB — TSH: TSH: 2.1 u[IU]/mL (ref 0.35–5.50)

## 2023-08-23 LAB — MICROALBUMIN / CREATININE URINE RATIO
Creatinine,U: 163.5 mg/dL
Microalb Creat Ratio: 38.9 mg/g — ABNORMAL HIGH (ref 0.0–30.0)
Microalb, Ur: 63.6 mg/dL — ABNORMAL HIGH (ref 0.0–1.9)

## 2023-08-23 LAB — VITAMIN B12: Vitamin B-12: 393 pg/mL (ref 211–911)

## 2023-08-23 LAB — VITAMIN D 25 HYDROXY (VIT D DEFICIENCY, FRACTURES): VITD: 21.43 ng/mL — ABNORMAL LOW (ref 30.00–100.00)

## 2023-08-23 MED ORDER — LINAGLIPTIN 5 MG PO TABS: 5.0000 mg | ORAL_TABLET | Freq: Every day | ORAL | 2 refills | Status: DC

## 2023-08-23 MED ORDER — BUPROPION HCL ER (XL) 150 MG PO TB24: 150.0000 mg | ORAL_TABLET | Freq: Every day | ORAL | 2 refills | Status: DC

## 2023-08-23 MED ORDER — SULFAMETHOXAZOLE-TRIMETHOPRIM 800-160 MG PO TABS: 1.0000 | ORAL_TABLET | Freq: Two times a day (BID) | ORAL | 0 refills | Status: AC

## 2023-08-23 MED ORDER — BASAGLAR KWIKPEN 100 UNIT/ML ~~LOC~~ SOPN: 50.0000 [IU] | PEN_INJECTOR | Freq: Every day | SUBCUTANEOUS | 2 refills | Status: DC

## 2023-08-23 NOTE — Assessment & Plan Note (Signed)
 Has lost about 10 Lb since 01/2023, poorly controlled DM II could be a contributing factor. Patient understands the benefits of wt loss as well as adverse effects of obesity. Consistency with healthy diet and physical activity encouraged.

## 2023-08-23 NOTE — Assessment & Plan Note (Addendum)
 Problem is not well controlled. Last HgA1C was 8.7 in 12/2022 and reporting BS's 200's. Continue Basaglar  40 units daily and Tradjenta  5 mg daily.   Further recommendation will be given according to hemoglobin A1c result. Stressed the importance of periodic eye exam, dental and foot care.   Overdue for eye exam.  Will try to get patient assistance to approve GLP-1 agonist.

## 2023-08-23 NOTE — Assessment & Plan Note (Signed)
 Problem has been going on for a few years, stable overall. He would like to try medication. He tried Trazodone in the past for sleep, did not feel like it helped with mood. Wellbutrin XL 150 mg daily started today. F/U in 6 weeks , before if needed.

## 2023-08-23 NOTE — Patient Instructions (Addendum)
 A few things to remember from today's visit:  Type 2 diabetes mellitus with other specified complication, with long-term current use of insulin  (HCC) - Plan: Comprehensive metabolic panel, Microalbumin / creatinine urine ratio  Depression, major, recurrent, mild (HCC) - Plan: VITAMIN D  25 Hydroxy (Vit-D Deficiency, Fractures)  Tingling of both feet - Plan: VITAMIN D  25 Hydroxy (Vit-D Deficiency, Fractures), Vitamin B12  Hypertriglyceridemia - Plan: Lipid panel  Insomnia, unspecified type - Plan: TSH  Abscess of groin, left - Plan: sulfamethoxazole -trimethoprim  (BACTRIM  DS) 800-160 MG tablet  Wellbutrin  daily in the morning for depression. No changes in rest. Local heat on abscess. Bactrim  for 7 days.  If you need refills for medications you take chronically, please call your pharmacy. Do not use My Chart to request refills or for acute issues that need immediate attention. If you send a my chart message, it may take a few days to be addressed, specially if I am not in the office.  Please be sure medication list is accurate. If a new problem present, please set up appointment sooner than planned today.

## 2023-08-23 NOTE — Assessment & Plan Note (Addendum)
 Continue rosuvastatin 20 mg daily and Lopid 600 mg bid. Low-fat diet was recommended. Further recommendation will be given according to lipid panel result

## 2023-08-23 NOTE — Assessment & Plan Note (Signed)
 Trazodone did not help. For now good sleep hygiene recommended. Will consider a different treatment if not improve with depression treatment.

## 2023-08-26 ENCOUNTER — Ambulatory Visit (INDEPENDENT_AMBULATORY_CARE_PROVIDER_SITE_OTHER): Payer: 59

## 2023-08-26 DIAGNOSIS — E538 Deficiency of other specified B group vitamins: Secondary | ICD-10-CM | POA: Diagnosis not present

## 2023-08-26 DIAGNOSIS — E1169 Type 2 diabetes mellitus with other specified complication: Secondary | ICD-10-CM | POA: Diagnosis not present

## 2023-08-26 DIAGNOSIS — Z794 Long term (current) use of insulin: Secondary | ICD-10-CM

## 2023-08-26 LAB — POCT GLYCOSYLATED HEMOGLOBIN (HGB A1C): Hemoglobin A1C: 11.4 % — AB (ref 4.0–5.6)

## 2023-08-26 MED ORDER — CYANOCOBALAMIN 1000 MCG/ML IJ SOLN
1000.0000 ug | Freq: Once | INTRAMUSCULAR | Status: AC
  Administered 2023-08-26: 1000 ug via INTRAMUSCULAR

## 2023-08-26 NOTE — Progress Notes (Signed)
Per orders of Dr. Jordan, injection of B12 given by Malayia Spizzirri E Leira Regino. Patient tolerated injection well.  

## 2023-08-29 ENCOUNTER — Telehealth: Payer: Self-pay

## 2023-08-29 NOTE — Telephone Encounter (Signed)
-----   Message from Sherrill Raring sent at 08/29/2023 12:16 PM EST ----- Regarding: Ozempic Problem Hello,  Patient submitted application for Ozempic assistance from [employer]. However, it is declined due to patient having Nurse, learning disability.  Patient would need to receive through his insurance company at his pharmacy. If still cost prohibitive, can use a copay card that can make it as little as $25/month.  Can an rx be sent in to so we can determine if med will be covered? Unable to run a test claim.  Thank you, Sherrill Raring, PharmD Clinical Pharmacist [phone]

## 2023-09-06 ENCOUNTER — Other Ambulatory Visit: Payer: Self-pay | Admitting: Family Medicine

## 2023-09-06 DIAGNOSIS — E1169 Type 2 diabetes mellitus with other specified complication: Secondary | ICD-10-CM

## 2023-09-06 MED ORDER — SEMAGLUTIDE(0.25 OR 0.5MG/DOS) 2 MG/3ML ~~LOC~~ SOPN: PEN_INJECTOR | SUBCUTANEOUS | 1 refills | Status: DC

## 2023-09-06 NOTE — Telephone Encounter (Signed)
I am sending a prescription for Ozempic, he can try to use a copay card. If not covered we will need to increase insulin dose and add before meals insulin to better control glucose. If he is interested we can also arrange appt with nutritionist. Thanks, BJ

## 2023-09-16 ENCOUNTER — Ambulatory Visit: Payer: Self-pay | Admitting: Family Medicine

## 2023-09-16 NOTE — Telephone Encounter (Signed)
 Noted.

## 2023-09-16 NOTE — Telephone Encounter (Signed)
 Chief Complaint: fever Symptoms: fever, cough, runny nose, body aches Frequency: yesterday AM Pertinent Negatives: Patient denies CP, SOB aside from nasal congestion Disposition: [] ED /[x] Urgent Care (no appt availability in office) / [] Appointment(In office/virtual)/ []  [facility]/ [] Home Care/ [] Refused Recommended Disposition /[] [place] [facility]/ []  Follow-up with PCP Additional Notes: Pt reports fever, cough, runny nose, and body aches that began yesterday AM. Pt states he went to work and came home halfway through the day, laid down, and began feeling much worse. Pt states the cough is non-productive. Pt states his fever is 102F. He has been alternating Tylenol  and ibuprofen but states those medications only bring his fever down to 100F. No CP, no SOB other than nasal congestion. Endorses weakness. Per protocol, RN advised pt he should be seen within 4 hrs due to his hx of type 2 DM. Pt agreeable. [facility] had no availability today. RN advised pt should go to UC. RN unable to find openings today at UC at the locations near to pt's house. Pt offered that he would go as a walk-in and that he would have his wife take him. RN advised that if pt develops CP, SOB, or worsening he should call 911. Pt verbalized understanding.   Reason for Disposition  [1] Fever > 100.0 F (37.8 C) AND [2] diabetes mellitus or weak immune system (e.g., HIV positive, cancer chemo, splenectomy, organ transplant, chronic steroids)  Answer Assessment - Initial Assessment Questions 1. TEMPERATURE: What is the most recent temperature?  How was it measured?      102.28F (alternating between Tylenol  and ibuprofen - will bring temp down to 100.something and it'll turn around and go back up 2. ONSET: When did the fever start?      Yesterday morning - went to work 1/2 a day, laid down, and it hit me 3. CHILLS: Do you have chills? If yes: How bad are they?  (e.g., none, mild, moderate, severe)   -  NONE: no chills   - MILD: feeling cold   - MODERATE: feeling very cold, some shivering (feels better under a thick blanket)   - SEVERE: feeling extremely cold with shaking chills (general body shaking, rigors; even under a thick blanket)      Chills yesterday, states they were severe. None today. 4. OTHER SYMPTOMS: Do you have any other symptoms besides the fever?  (e.g., abdomen pain, cough, diarrhea, earache, headache, sore throat, urination pain)     Fever, cough, body aches, runny nose, dry mouth, weakness, dizziness a little bit (trying to lay in the bed) 5. CAUSE: If there are no symptoms, ask: What do you think is causing the fever?     6. CONTACTS: Does anyone else in the family have an infection?     No 7. TREATMENT: What have you done so far to treat this fever? (e.g., medications)     Tylenol  and ibuprofen 10. TRAVEL: Have you traveled out of the country in the last month? (e.g., travel history, exposures)       No  Protocols used: [facility]

## 2023-09-19 ENCOUNTER — Ambulatory Visit (INDEPENDENT_AMBULATORY_CARE_PROVIDER_SITE_OTHER): Payer: 59

## 2023-09-19 DIAGNOSIS — R509 Fever, unspecified: Secondary | ICD-10-CM

## 2023-09-19 LAB — POC COVID19 BINAXNOW: SARS Coronavirus 2 Ag: NEGATIVE

## 2023-10-04 ENCOUNTER — Encounter: Payer: Self-pay | Admitting: Family Medicine

## 2023-10-04 ENCOUNTER — Ambulatory Visit: Payer: 59 | Admitting: Family Medicine

## 2023-10-04 VITALS — BP 136/80 | HR 90 | Resp 16 | Ht 74.0 in | Wt 345.4 lb

## 2023-10-04 DIAGNOSIS — F330 Major depressive disorder, recurrent, mild: Secondary | ICD-10-CM

## 2023-10-04 DIAGNOSIS — E538 Deficiency of other specified B group vitamins: Secondary | ICD-10-CM | POA: Insufficient documentation

## 2023-10-04 DIAGNOSIS — E1169 Type 2 diabetes mellitus with other specified complication: Secondary | ICD-10-CM | POA: Diagnosis not present

## 2023-10-04 DIAGNOSIS — Z6841 Body Mass Index (BMI) 40.0 and over, adult: Secondary | ICD-10-CM

## 2023-10-04 DIAGNOSIS — G4700 Insomnia, unspecified: Secondary | ICD-10-CM | POA: Diagnosis not present

## 2023-10-04 DIAGNOSIS — Z794 Long term (current) use of insulin: Secondary | ICD-10-CM

## 2023-10-04 MED ORDER — BUPROPION HCL ER (XL) 300 MG PO TB24: 300.0000 mg | ORAL_TABLET | Freq: Every day | ORAL | 1 refills | Status: DC

## 2023-10-04 MED ORDER — CYANOCOBALAMIN 1000 MCG/ML IJ SOLN
1000.0000 ug | Freq: Once | INTRAMUSCULAR | Status: AC
  Administered 2023-10-04: 1000 ug via INTRAMUSCULAR

## 2023-10-04 MED ORDER — ZOLPIDEM TARTRATE 5 MG PO TABS
2.5000 mg | ORAL_TABLET | Freq: Every evening | ORAL | 0 refills | Status: DC | PRN
Start: 2023-10-04 — End: 2023-12-06

## 2023-10-04 NOTE — Assessment & Plan Note (Addendum)
 Problem is not adequately controlled. We discussed possible complications of elevated glucose. His health insurance will not cover a GLP-1 agonist. Last hemoglobin A1c 11.4 in 08/2023, still having BS in the 200s. Jardiance caused recurrent mycotic balanitis and metformin GI side effects. Today Basaglar dose increased from 15 units to 55 units. Continue Tradjenta 5 mg daily. Follow-up in 11/2023.

## 2023-10-04 NOTE — Patient Instructions (Addendum)
 A few things to remember from today's visit:  Type 2 diabetes mellitus with other specified complication, with long-term current use of insulin (HCC)  Insomnia, unspecified type - Plan: zolpidem (AMBIEN) 5 MG tablet Basaglar increased to 55 U. Wellbutrin increased to 300 mg. Ambien at bedtime added. Avoid eating after 8 pm. Let me know if Remus Loffler is helping with sleep in 10-14 days. In a week start B12 over the counter 2000 mcg once per week. Monitor blood pressure at home, goal is 120's/70's.  Try to avoid the following Sweetened juice, energy drinks, flavored coffee creamer's, fruit flavored drinks, sports drinks, sweetened tea, fruit punch.  Cake mixes, chocolate bars, cookies, donuts, frozen waffles and pancakes, sweet rolls and pastries, muffins, sweet crackers.  If you need refills for medications you take chronically, please call your pharmacy. Do not use My Chart to request refills or for acute issues that need immediate attention. If you send a my chart message, it may take a few days to be addressed, specially if I am not in the office.  Please be sure medication list is accurate. If a new problem present, please set up appointment sooner than planned today.

## 2023-10-04 NOTE — Assessment & Plan Note (Addendum)
 Lost about 4 Lb since his last visit. We discussed the importance of following a healthier diet. A few co-morbidities are not well-controlled, triglycerides 316, hemoglobin A1c 11.4, his SBP today mildly elevated (136), and BMI 44.3. We discussed the risk of complications if we do not aggressively manage CV risk factors. Recommend starting with avoiding eating after 8 pm and decrease intake of sugar added foods (bread,pastries,sodas,cookies, etc).

## 2023-10-04 NOTE — Progress Notes (Signed)
 HPI: [name] is a 48 y.o. male with a PMHx significant for DM II, HLD, aortic atherosclerosis, and gout, who is here today to follow on recent OV visit.  Depression and insomnia:  Was started on Wellbutrin XL 150 mg at his last OV on 08/23/23.  Has not noticed any changes or improvements with moods, motivation, energy level, or appetite.  He reports good compliance and tolerance; denies any adverse side effects.   His sleep quality has not changed since starting Wellbutrin.  Pt is sleeping about 5-6 hours on average.  He endorses difficulty falling asleep about 3-4 nights a week. Having  "racing thoughts." Exacerbated by anxiety, planning on moving to a new place in 2 months. Last night he slept about 2 hours; struggled to fall asleep.  He is not aware of any apneic episodes or loud snoring. Previously was on Trazodone, did not help with sleep.  Diabetes Mellitus II: Diagnosed in 2012. Monitors his blood sugars and states they average in the 200s.  Currently on Basaglar 50 units daily and Tradjenta 5 mg daily.  Has cut back on sweets; otherwise diet has not changed from last OV. Has not cut back on carbs yet. Snacks on chips after dinner sometimes.   Lab Results  Component Value Date   HGBA1C 11.4 (A) 08/26/2023   Lab Results  Component Value Date   MICROALBUR 63.6 (H) 08/23/2023   Hyperlipidemia: Currently he is on rosuvastatin 20 mg daily and Lopid 600 mg twice daily. He has not been consistent with following low-fat diet. Lab Results  Component Value Date   CHOL 139 08/23/2023   HDL 30.20 (L) 08/23/2023   LDLCALC 45 08/23/2023   LDLDIRECT 60.0 12/17/2022   TRIG 316.0 (H) 08/23/2023   CHOLHDL 5 08/23/2023   BP mildly elevated today, no history of hypertension. OSA, try CPAP, did not tolerate years ago.  Review of Systems  Constitutional:  Positive for fatigue. Negative for activity change, appetite change, fever and unexpected weight change.  HENT:   Negative for nosebleeds and sore throat.   Eyes:  Negative for redness and visual disturbance.  Respiratory:  Negative for cough, shortness of breath and wheezing.   Cardiovascular:  Negative for chest pain, palpitations and leg swelling.  Gastrointestinal:  Negative for abdominal pain, nausea and vomiting.  Genitourinary:  Negative for decreased urine volume, dysuria and hematuria.  Skin:  Negative for rash.  Neurological:  Negative for syncope, weakness and headaches.  Psychiatric/Behavioral:  Positive for sleep disturbance. Negative for confusion and hallucinations.   See other pertinent positives and negatives in HPI.  Current Outpatient Medications on File Prior to Visit  Medication Sig Dispense Refill   allopurinol (ZYLOPRIM) 100 MG tablet TAKE 1 TABLET(100 MG) BY MOUTH TWICE DAILY 180 tablet 1   blood glucose meter kit and supplies KIT Dispense based on patient and insurance preference. Use up to four times daily as directed. 1 each 0   gemfibrozil (LOPID) 600 MG tablet TAKE 1 TABLET(600 MG) BY MOUTH TWICE DAILY BEFORE A MEAL 180 tablet 1   icosapent Ethyl (VASCEPA) 1 g capsule Take 2 capsules (2 g total) by mouth 2 (two) times daily. 120 capsule 6   Insulin Glargine (BASAGLAR KWIKPEN) 100 UNIT/ML Inject 50 Units into the skin daily. 30 mL 2   Insulin Pen Needle (B-D UF III MINI PEN NEEDLES) 31G X 5 MM MISC USE 1 PEN NEEDLE WITH SAXENDA DAILY AS DIRECTED 100 each 3   linagliptin (TRADJENTA) 5  MG TABS tablet Take 1 tablet (5 mg total) by mouth daily. 90 tablet 2   MITIGARE 0.6 MG CAPS TAKE 1 TABLET BY MOUTH DAILY AS NEEDED. HOLD ATORVASTAIN WHEN TAKING THIS MEDICATION 30 capsule 3   rosuvastatin (CRESTOR) 20 MG tablet TAKE 1 TABLET(20 MG) BY MOUTH DAILY 90 tablet 3   No current facility-administered medications on file prior to visit.    Past Medical History:  Diagnosis Date   Diabetes mellitus without complication (HCC)    GERD (gastroesophageal reflux disease)    Gout    Hx of  adenomatous colonic polyps 08/31/2022   08/2022 - 3 polyps max 8 mm 1 TV adenoma + 2 TA's recall 2027   Hyperlipidemia    Hypertension    MO MED'S   Obesity, Class III, BMI 40-49.9 (morbid obesity) (HCC)    Sleep apnea    NO C PAP   No Known Allergies  Social History   Socioeconomic History   Marital status: Married    Spouse name: Not on file   Number of children: 2   Years of education: Not on file   Highest education level: Not on file  Occupational History   Occupation: unemployed  Tobacco Use   Smoking status: Former    Current packs/day: 0.00    Average packs/day: 1.5 packs/day for 10.0 years (15.0 ttl pk-yrs)    Types: Cigarettes    Start date: 08/10/1999    Quit date: 08/09/2009    Years since quitting: 14.1    Passive exposure: Past   Smokeless tobacco: Never  Vaping Use   Vaping status: Never Used  Substance and Sexual Activity   Alcohol use: Yes    Comment: OCCASSIONALY   Drug use: No   Sexual activity: Yes  Other Topics Concern   Not on file  Social History Narrative   Marital status: married x 5 years. Separated from wife in 08/2012.     Children: 2 children from previous marriage; gets children every two weeks; sees more frequently.     Lives: alone; separated from wife in 08/2012.     Employment: Conservator, museum/gallery.      Tobacco: quit; smoked x 13 years.      Alcohol: once every three months.      Drugs: none now; marijuana in past.      Exercise: started going to gym in 08/2012; going 4-5 days per week; elliptical and stationary bike.            Social Drivers of Health   Financial Resource Strain: Patient Declined (09/16/2022)   Overall Financial Resource Strain (CARDIA)    Difficulty of Paying Living Expenses: Patient declined  Food Insecurity: Patient Declined (09/16/2022)   Hunger Vital Sign    Worried About Running Out of Food in the Last Year: Patient declined    Ran Out of Food in the Last Year: Patient declined  Transportation Needs: No  Transportation Needs (09/16/2022)   PRAPARE - Administrator, Civil Service (Medical): No    Lack of Transportation (Non-Medical): No  Physical Activity: Unknown (09/16/2022)   Exercise Vital Sign    Days of Exercise per Week: 0 days    Minutes of Exercise per Session: Not on file  Stress: No Stress Concern Present (09/16/2022)   [employer] of Occupational Health - Occupational Stress Questionnaire    Feeling of Stress : Only a little  Social Connections: Unknown (09/16/2022)   Social Connection and Isolation Panel [NHANES]  Frequency of Communication with Friends and Family: Not on file    Frequency of Social Gatherings with Friends and Family: Not on file    Attends Religious Services: Not on file    Active Member of Clubs or Organizations: Patient declined    Attends Banker Meetings: Not on file    Marital Status: Not on file   Vitals:   10/04/23 0703  BP: 136/80  Pulse: 90  Resp: 16  SpO2: 97%   Wt Readings from Last 3 Encounters:  10/04/23 (!) 345 lb 6 oz (156.7 kg)  08/23/23 (!) 349 lb (158.3 kg)  01/17/23 (!) 359 lb 11.2 oz (163.2 kg)   Body mass index is 44.34 kg/m.  Physical Exam Vitals and nursing note reviewed.  Constitutional:      General: He is not in acute distress.    Appearance: He is well-developed.  HENT:     Head: Normocephalic and atraumatic.  Eyes:     Conjunctiva/sclera: Conjunctivae normal.  Cardiovascular:     Rate and Rhythm: Normal rate and regular rhythm.     Heart sounds: No murmur heard.    Comments: Trace pitting LE edema, bilateral. Pulmonary:     Effort: Pulmonary effort is normal. No respiratory distress.     Breath sounds: Normal breath sounds.  Abdominal:     Palpations: Abdomen is soft. There is no mass.     Tenderness: There is no abdominal tenderness.  Skin:    General: Skin is warm.     Findings: No erythema or rash.  Neurological:     Mental Status: He is alert and oriented to person, place,  and time.     Cranial Nerves: No cranial nerve deficit.     Gait: Gait normal.  Psychiatric:        Mood and Affect: Mood is not anxious or depressed. Affect is flat.   ASSESSMENT AND PLAN: [name] was seen today for a follow up for depression and diabetes type 2.   Type 2 diabetes mellitus with other specified complication, with long-term current use of insulin (HCC) Assessment & Plan: Problem is not adequately controlled. We discussed possible complications of elevated glucose. His health insurance will not cover a GLP-1 agonist. Last hemoglobin A1c 11.4 in 08/2023, still having BS in the 200s. Jardiance caused recurrent mycotic balanitis and metformin GI side effects. Today Basaglar dose increased from 15 units to 55 units. Continue Tradjenta 5 mg daily. Follow-up in 11/2023.   Morbid obesity with BMI of 40.0-44.9, adult [facility]) Assessment & Plan: Lost about 4 Lb since his last visit. We discussed the importance of following a healthier diet. A few co-morbidities are not well-controlled, triglycerides 316, hemoglobin A1c 11.4, his SBP today mildly elevated (136), and BMI 44.3. We discussed the risk of complications if we do not aggressively manage CV risk factors. Recommend starting with avoiding eating after 8 pm and decrease intake of sugar added foods (bread,pastries,sodas,cookies, etc).   Insomnia, unspecified type Assessment & Plan: Problem is not well-controlled. Trazodone did not help in the past. We discussed other treatment options, agrees with trial of Ambien 5 mg around bedtime as needed. We reviewed some side effects of medication. Instructed to let me know if medication is being effective in about 10 to 14 days.  Orders: -     Zolpidem Tartrate; Take 0.5-1 tablets (2.5-5 mg total) by mouth at bedtime as needed for sleep.  Dispense: 15 tablet; Refill: 0  B12 deficiency Assessment &  Plan: After verbal consent, he received B12 at 1000 mcg IM  today. Next week he will start OTC B12 2000 mcg once per week.  Orders: -     Cyanocobalamin  Depression, major, recurrent, mild (HCC) Assessment & Plan: He has not noted any positive benefit with Wellbutrin XR 150 mg. Chronic fatigue can also be aggravated by some of his chronic medical conditions, most of which are not well controlled. We discussed options, some medications like mirtazapine may help back can also increase appetite under for weight gain.  He agrees with increasing Wellbutrin XR from 150 to 300 mg daily. Regular physical activity and a healthful diet will also help. F/U in 2 months, before if needed.  Orders: -     buPROPion HCl ER (XL); Take 1 tablet (300 mg total) by mouth daily.  Dispense: 30 tablet; Refill: 1  I spent a total of 41 minutes in both face to face and non face to face activities for this visit on the date of this encounter. During this time history was obtained and documented, examination was performed, prior labs reviewed, and assessment/plan discussed.  Return in about 9 weeks (around 12/06/2023) for chronic problems-fasting.  I, Isabelle Course, acting as a scribe for Dyquan Minks [place], MD., have documented all relevant documentation on the behalf of Nataleigh Griffin [place], MD, as directed by  Emitt Maglione [place], MD while in the presence of Lichelle Viets [place], MD.  I, Icey Tello [place], MD, have reviewed all documentation for this visit. The documentation on 10/04/23 for the exam, diagnosis, procedures, and orders are all accurate and complete.  Jeneane Pieczynski G. [place], MD  [facility] [place] office.

## 2023-10-04 NOTE — Assessment & Plan Note (Signed)
 He has not noted any positive benefit with Wellbutrin XR 150 mg. Chronic fatigue can also be aggravated by some of his chronic medical conditions, most of which are not well controlled. We discussed options, some medications like mirtazapine may help back can also increase appetite under for weight gain.  He agrees with increasing Wellbutrin XR from 150 to 300 mg daily. Regular physical activity and a healthful diet will also help. F/U in 2 months, before if needed.

## 2023-10-04 NOTE — Assessment & Plan Note (Addendum)
 Problem is not well-controlled. Trazodone did not help in the past. We discussed other treatment options, agrees with trial of Ambien 5 mg around bedtime as needed. We reviewed some side effects of medication. Instructed to let me know if medication is being effective in about 10 to 14 days.

## 2023-10-04 NOTE — Assessment & Plan Note (Signed)
 After verbal consent, he received B12 at 1000 mcg IM today. Next week he will start OTC B12 2000 mcg once per week.

## 2023-11-23 ENCOUNTER — Other Ambulatory Visit: Payer: Self-pay | Admitting: Family Medicine

## 2023-11-23 DIAGNOSIS — F330 Major depressive disorder, recurrent, mild: Secondary | ICD-10-CM

## 2023-12-05 NOTE — Progress Notes (Signed)
 HPI: Miguel Sanders is a 48 y.o. male with a PMHx significant for DM II, HLD, aortic atherosclerosis, depression, insomnia, and gout, who is here today for chronic disease management.  Last seen on 10/04/2023  Anxiety/Depression:  Currently on Wellbutrin  XL 300 mg daily. He has not noticed any improvement with the medication. No side effects reported.  Insomnia:  He has not been taking Ambien  5 mg because he is nervous about side effects.  His sleep is about the same as at the time of his last visit, 5-6 hours in average.Difficulty falling asleep.  He has used a CPAP in the past for OSA but stopped using it about a year ago.   Diabetes Mellitus II: Diagnosed in 2012.  - Checking BG at home: Patient states his blood sugars have been in the 200s. He does not feel like problem is improving. - Medications: Currently on Basaglar  50 units daily and Tradjenta  5 mg daily.  - Diet: He has not made any dietary changes since his last visit. He has not seen a nutritionist for years but is not interested in seeing one. He eats lunch out daily, skips breakfast, and eats dinner at home. - Exercise: He has not made any changes to his exercise routine.  - eye exam: UTD. He has had an eye exam within the last year and was told he had no changes related to diabetes.  - foot exam: 12/2022 - Negative for symptoms of hypoglycemia, polyuria, polydipsia, numbness extremities, foot ulcers/trauma  Lab Results  Component Value Date   HGBA1C 11.4 (A) 08/26/2023   Microalbuminuria: He has not noted a hematuria, decreased urine output, or foamy urine. Lab Results  Component Value Date   MICROALBUR 63.6 (H) 08/23/2023   Hyperlipidemia: Currently on Vascepa  2 g daily, Lopid  600 mg twice daily, and rosuvastatin  20 mg daily.  Side effects from medication: none Lab Results  Component Value Date   CHOL 139 08/23/2023   HDL 30.20 (L) 08/23/2023   LDLCALC 45 08/23/2023   LDLDIRECT 60.0 12/17/2022   TRIG  316.0 (H) 08/23/2023   CHOLHDL 5 08/23/2023   Review of Systems  Constitutional:  Positive for fatigue. Negative for activity change, appetite change and fever.  HENT:  Negative for nosebleeds, sore throat and trouble swallowing.   Eyes:  Negative for redness and visual disturbance.  Respiratory:  Negative for cough, shortness of breath and wheezing.   Cardiovascular:  Negative for chest pain, palpitations and leg swelling.  Gastrointestinal:  Negative for abdominal pain, nausea and vomiting.  Neurological:  Negative for syncope, facial asymmetry and weakness.  Psychiatric/Behavioral:  Positive for sleep disturbance. Negative for confusion.   See other pertinent positives and negatives in HPI.  Current Outpatient Medications on File Prior to Visit  Medication Sig Dispense Refill   allopurinol  (ZYLOPRIM ) 100 MG tablet TAKE 1 TABLET(100 MG) BY MOUTH TWICE DAILY 180 tablet 1   blood glucose meter kit and supplies KIT Dispense based on patient and insurance preference. Use up to four times daily as directed. 1 each 0   buPROPion  (WELLBUTRIN  XL) 300 MG 24 hr tablet TAKE 1 TABLET(300 MG) BY MOUTH DAILY 90 tablet 1   gemfibrozil  (LOPID ) 600 MG tablet TAKE 1 TABLET(600 MG) BY MOUTH TWICE DAILY BEFORE A MEAL 180 tablet 1   icosapent  Ethyl (VASCEPA ) 1 g capsule Take 2 capsules (2 g total) by mouth 2 (two) times daily. 120 capsule 6   Insulin  Pen Needle (B-D UF III MINI PEN NEEDLES) 31G  X 5 MM MISC USE 1 PEN NEEDLE WITH SAXENDA  DAILY AS DIRECTED 100 each 3   linagliptin  (TRADJENTA ) 5 MG TABS tablet Take 1 tablet (5 mg total) by mouth daily. 90 tablet 2   MITIGARE  0.6 MG CAPS TAKE 1 TABLET BY MOUTH DAILY AS NEEDED. HOLD ATORVASTAIN WHEN TAKING THIS MEDICATION 30 capsule 3   rosuvastatin  (CRESTOR ) 20 MG tablet TAKE 1 TABLET(20 MG) BY MOUTH DAILY 90 tablet 3   No current facility-administered medications on file prior to visit.   Past Medical History:  Diagnosis Date   Diabetes mellitus without  complication (HCC)    GERD (gastroesophageal reflux disease)    Gout    Hx of adenomatous colonic polyps 08/31/2022   08/2022 - 3 polyps max 8 mm 1 TV adenoma + 2 TA's recall 2027   Hyperlipidemia    Hypertension    MO MED'S   Obesity, Class III, BMI 40-49.9 (morbid obesity) (HCC)    Sleep apnea    NO C PAP   No Known Allergies  Social History   Socioeconomic History   Marital status: Married    Spouse name: Not on file   Number of children: 2   Years of education: Not on file   Highest education level: Not on file  Occupational History   Occupation: unemployed  Tobacco Use   Smoking status: Former    Current packs/day: 0.00    Average packs/day: 1.5 packs/day for 10.0 years (15.0 ttl pk-yrs)    Types: Cigarettes    Start date: 08/10/1999    Quit date: 08/09/2009    Years since quitting: 14.3    Passive exposure: Past   Smokeless tobacco: Never  Vaping Use   Vaping status: Never Used  Substance and Sexual Activity   Alcohol use: Yes    Comment: OCCASSIONALY   Drug use: No   Sexual activity: Yes  Other Topics Concern   Not on file  Social History Narrative   Marital status: married x 5 years. Separated from wife in 08/2012.     Children: 2 children from previous marriage; gets children every two weeks; sees more frequently.     Lives: alone; separated from wife in 08/2012.     Employment: Conservator, museum/gallery.      Tobacco: quit; smoked x 13 years.      Alcohol: once every three months.      Drugs: none now; marijuana in past.      Exercise: started going to gym in 08/2012; going 4-5 days per week; elliptical and stationary bike.            Social Drivers of Health   Financial Resource Strain: Patient Declined (09/16/2022)   Overall Financial Resource Strain (CARDIA)    Difficulty of Paying Living Expenses: Patient declined  Food Insecurity: Patient Declined (09/16/2022)   Hunger Vital Sign    Worried About Running Out of Food in the Last Year: Patient declined    Ran  Out of Food in the Last Year: Patient declined  Transportation Needs: No Transportation Needs (09/16/2022)   PRAPARE - Administrator, Civil Service (Medical): No    Lack of Transportation (Non-Medical): No  Physical Activity: Unknown (09/16/2022)   Exercise Vital Sign    Days of Exercise per Week: 0 days    Minutes of Exercise per Session: Not on file  Stress: No Stress Concern Present (09/16/2022)   [employer] of Occupational Health - Occupational Stress Questionnaire    Feeling of  Stress : Only a little  Social Connections: Unknown (09/16/2022)   Social Connection and Isolation Panel [NHANES]    Frequency of Communication with Friends and Family: Not on file    Frequency of Social Gatherings with Friends and Family: Not on file    Attends Religious Services: Not on file    Active Member of Clubs or Organizations: Patient declined    Attends Banker Meetings: Not on file    Marital Status: Not on file   Vitals:   12/06/23 0704  BP: 128/80  Pulse: 94  Resp: 16  SpO2: 98%   Wt Readings from Last 3 Encounters:  12/06/23 (!) 347 lb 2 oz (157.5 kg)  10/04/23 (!) 345 lb 6 oz (156.7 kg)  08/23/23 (!) 349 lb (158.3 kg)   Body mass index is 44.57 kg/m.  Physical Exam Vitals and nursing note reviewed.  Constitutional:      General: He is not in acute distress.    Appearance: He is well-developed.  HENT:     Head: Normocephalic and atraumatic.     Mouth/Throat:     Mouth: Mucous membranes are moist.     Pharynx: Oropharynx is clear. Uvula midline.  Eyes:     Conjunctiva/sclera: Conjunctivae normal.  Cardiovascular:     Rate and Rhythm: Normal rate and regular rhythm.     Pulses:          Dorsalis pedis pulses are 2+ on the right side and 2+ on the left side.     Heart sounds: No murmur heard. Pulmonary:     Effort: Pulmonary effort is normal. No respiratory distress.     Breath sounds: Normal breath sounds.  Abdominal:     Palpations: Abdomen is  soft. There is no hepatomegaly or mass.     Tenderness: There is no abdominal tenderness.  Musculoskeletal:     Right lower leg: No edema.     Left lower leg: No edema.  Lymphadenopathy:     Cervical: No cervical adenopathy.  Skin:    General: Skin is warm.     Findings: No erythema or rash.  Neurological:     Mental Status: He is alert and oriented to person, place, and time.     Cranial Nerves: No cranial nerve deficit.     Gait: Gait normal.  Psychiatric:        Mood and Affect: Mood and affect normal.   ASSESSMENT AND PLAN:  Miguel Sanders was seen today for chronic disease management.   Orders Placed This Encounter  Procedures   Microalbumin / creatinine urine ratio   POC HgB A1c   Lab Results  Component Value Date   HGBA1C 9.9 (A) 12/06/2023   Lab Results  Component Value Date   MICROALBUR 30.6 (H) 12/06/2023   MICROALBUR 63.6 (H) 08/23/2023   Insomnia, unspecified type Problem is not adequately controlled. History of OSA, he has not been wearing his CPAP in over a year, not interested in resuming treatment. He did not try Ambien  because concerns about possible side effects. Continue adequate sleep hygiene.  Depression, major, recurrent, mild (HCC) Assessment & Plan: He does not feel like Wellbutrin  XR 300 mg has helped with symptoms, he does not have any been on it regard to continuing or stopping medication.  Because he reported not improvement in mood, recommend weaning of medication. Wellbutrin  XR 30 mg tablet 1/2 tablet daily for 10 days, then every other day for 10 days, then every third day for  10 days and discontinue.  If at any point he feels like move changes and realizes medication was helping, he was instructed to go back to full dose. He is not interested in psychotherapy at this time.  Type 2 diabetes mellitus with other specified complication, with long-term current use of insulin  (HCC) Assessment & Plan: Hemoglobin A1c is still not at goal but  improved, it went from 11.4  to 9.9. He is health insurance does not cover GLP-1 agonist. He has not tolerated Jardiance  in the past, I caused recurrent balanitis. Recommend adding Novolin 10 units before meals and increase dose of Basaglar  from 15 units to 55 units. Advise caution with the skipping meals, which can cause hypoglycemia. Declined nutrition referral and Prevnar 20. Continue regular eye care, dental prevention, and footcare. Follow-up in 3 to 4 months.  Orders: -     POCT glycosylated hemoglobin (Hb A1C) -     Dexcom G7 Receiver; 1 Device by Does not apply route as directed.  Dispense: 1 each; Refill: 1 -     Dexcom G7 Sensor; 1 every 10 days  Dispense: 6 each; Refill: 2 -     Microalbumin / creatinine urine ratio; Future -     NovoLIN R FlexPen; Inject 10 Units as directed 3 (three) times daily before meals.  Dispense: 6 mL; Refill: 2 -     Basaglar  KwikPen; Inject 55 Units into the skin daily.  Morbid obesity with BMI of 40.0-44.9, adult [facility]) Assessment & Plan: Weight is otherwise stable. We discussed benefits of weight loss He did not make any significant dietary change since his last visit and acknowledges he is not exercising regularly.  Because of poor water schedule, it is high for him to pack his lunch, recommend trying to do so. Recommend starting with daily brisk 10 minutes. We reviewed dietary habits for the past 24 to 48 hours, gave healthier choices. He is not interested in nutrition education..  Hypertriglyceridemia Assessment & Plan: Last TG 316 in 08/2023. Will plan on repeating fasting lipid panel next visit. Continue rosuvastatin  20 mg daily, Lopid  600 mg twice daily, and Vascepa  2 g twice daily. Low carb diet will also help.  Positive for microalbuminuria We discussed possible complications of elevated glucose. Currently he is not on ARS agent. Jardiance  caused recurrent balanitis in the past. Further recommendation will be given according to lab  result.  I spent a total of 41 minutes in both face to face and non face to face activities for this visit on the date of this encounter. During this time history was obtained and documented, examination was performed, prior labs reviewed, and assessment/plan discussed.  Return in about 3 months (around 03/09/2024) for chronic problems.  I, Fritz Jewel Wierda, acting as a scribe for Melida Northington [place], MD., have documented all relevant documentation on the behalf of Willet Schleifer [place], MD, as directed by  Kabrina Christiano [place], MD while in the presence of Yeison Sippel [place], MD.   I, Skylarr Liz [place], MD, have reviewed all documentation for this visit. The documentation on 12/06/23 for the exam, diagnosis, procedures, and orders are all accurate and complete.  Noemi Ishmael G. [place], MD  [facility]. [place] office.

## 2023-12-06 ENCOUNTER — Ambulatory Visit: Payer: 59 | Admitting: Family Medicine

## 2023-12-06 ENCOUNTER — Encounter: Payer: Self-pay | Admitting: Family Medicine

## 2023-12-06 VITALS — BP 128/80 | HR 94 | Resp 16 | Ht 74.0 in | Wt 347.1 lb

## 2023-12-06 DIAGNOSIS — F330 Major depressive disorder, recurrent, mild: Secondary | ICD-10-CM | POA: Diagnosis not present

## 2023-12-06 DIAGNOSIS — Z794 Long term (current) use of insulin: Secondary | ICD-10-CM | POA: Diagnosis not present

## 2023-12-06 DIAGNOSIS — Z6841 Body Mass Index (BMI) 40.0 and over, adult: Secondary | ICD-10-CM

## 2023-12-06 DIAGNOSIS — E781 Pure hyperglyceridemia: Secondary | ICD-10-CM

## 2023-12-06 DIAGNOSIS — G4700 Insomnia, unspecified: Secondary | ICD-10-CM

## 2023-12-06 DIAGNOSIS — E1169 Type 2 diabetes mellitus with other specified complication: Secondary | ICD-10-CM | POA: Diagnosis not present

## 2023-12-06 DIAGNOSIS — R809 Proteinuria, unspecified: Secondary | ICD-10-CM

## 2023-12-06 LAB — POCT GLYCOSYLATED HEMOGLOBIN (HGB A1C): Hemoglobin A1C: 9.9 % — AB (ref 4.0–5.6)

## 2023-12-06 LAB — MICROALBUMIN / CREATININE URINE RATIO
Creatinine,U: 110.9 mg/dL
Microalb Creat Ratio: 275.9 mg/g — ABNORMAL HIGH (ref 0.0–30.0)
Microalb, Ur: 30.6 mg/dL — ABNORMAL HIGH (ref 0.0–1.9)

## 2023-12-06 MED ORDER — BASAGLAR KWIKPEN 100 UNIT/ML ~~LOC~~ SOPN: 55.0000 [IU] | PEN_INJECTOR | Freq: Every day | SUBCUTANEOUS | Status: DC

## 2023-12-06 MED ORDER — NOVOLIN R FLEXPEN 100 UNIT/ML IJ SOPN: 10.0000 [IU] | PEN_INJECTOR | Freq: Three times a day (TID) | INTRAMUSCULAR | 2 refills | Status: DC

## 2023-12-06 MED ORDER — DEXCOM G7 RECEIVER DEVI
1.0000 | 1 refills | Status: AC
Start: 2023-12-06 — End: ?

## 2023-12-06 MED ORDER — DEXCOM G7 SENSOR MISC: 2 refills | Status: AC

## 2023-12-06 NOTE — Assessment & Plan Note (Addendum)
 Weight is otherwise stable. We discussed benefits of weight loss He did not make any significant dietary change since his last visit and acknowledges he is not exercising regularly.  Because of poor water schedule, it is high for him to pack his lunch, recommend trying to do so. Recommend starting with daily brisk 10 minutes. We reviewed dietary habits for the past 24 to 48 hours, gave healthier choices. He is not interested in nutrition education.

## 2023-12-06 NOTE — Patient Instructions (Addendum)
 A few things to remember from today's visit:  Insomnia, unspecified type  Depression, major, recurrent, mild (HCC)  Type 2 diabetes mellitus with other specified complication, with long-term current use of insulin  (HCC) - Plan: POC HgB A1c, Continuous Glucose Receiver (DEXCOM G7 RECEIVER) DEVI, Continuous Glucose Sensor (DEXCOM G7 SENSOR) MISC, Microalbumin / creatinine urine ratio, Insulin  Regular Human (NOVOLIN R FLEXPEN RELION) 100 UNIT/ML KwikPen, Insulin  Glargine (BASAGLAR  KWIKPEN) 100 UNIT/ML  Because Wellbutrin  doe snot seem to be helping, decrease dose to 1/2 ta daily for 10 days then every other day for 10 days then every 3rd day for 10 days and stop.  Basaglar  increased to 55 U daily, ideally 1 hour before supper. Today we added Novolin, short acting insulin  before meals, 10 U. Caution with skipping meals. 10 minutes of daily walk, brisk.  If you need refills for medications you take chronically, please call your pharmacy. Do not use My Chart to request refills or for acute issues that need immediate attention. If you send a my chart message, it may take a few days to be addressed, specially if I am not in the office.  Please be sure medication list is accurate. If a new problem present, please set up appointment sooner than planned today.

## 2023-12-06 NOTE — Assessment & Plan Note (Signed)
 Last TG 316 in 08/2023. Will plan on repeating fasting lipid panel next visit. Continue rosuvastatin  20 mg daily, Lopid  600 mg twice daily, and Vascepa  2 g twice daily. Low carb diet will also help.

## 2023-12-06 NOTE — Assessment & Plan Note (Addendum)
 Hemoglobin A1c is still not at goal but improved, it went from 11.4  to 9.9. He is health insurance does not cover GLP-1 agonist. He has not tolerated Jardiance  in the past, I caused recurrent balanitis. Recommend adding Novolin 10 units before meals and increase dose of Basaglar  from 15 units to 55 units. Advise caution with the skipping meals, which can cause hypoglycemia. Declined nutrition referral and Prevnar 20. Continue regular eye care, dental prevention, and footcare. Follow-up in 3 to 4 months.

## 2023-12-06 NOTE — Assessment & Plan Note (Signed)
 He does not feel like Wellbutrin  XR 300 mg has helped with symptoms, he does not have any been on it regard to continuing or stopping medication.  Because he reported not improvement in mood, recommend weaning of medication. Wellbutrin  XR 30 mg tablet 1/2 tablet daily for 10 days, then every other day for 10 days, then every third day for 10 days and discontinue.  If at any point he feels like move changes and realizes medication was helping, he was instructed to go back to full dose. He is not interested in psychotherapy at this time.

## 2023-12-21 ENCOUNTER — Other Ambulatory Visit: Payer: Self-pay | Admitting: Family Medicine

## 2023-12-21 DIAGNOSIS — E781 Pure hyperglyceridemia: Secondary | ICD-10-CM

## 2024-01-08 ENCOUNTER — Emergency Department (HOSPITAL_BASED_OUTPATIENT_CLINIC_OR_DEPARTMENT_OTHER)
Admission: EM | Admit: 2024-01-08 | Discharge: 2024-01-08 | Disposition: A | Discharge status: Home / Self Care | Attending: Emergency Medicine | Admitting: Emergency Medicine

## 2024-01-08 ENCOUNTER — Other Ambulatory Visit: Payer: Self-pay

## 2024-01-08 ENCOUNTER — Encounter (HOSPITAL_BASED_OUTPATIENT_CLINIC_OR_DEPARTMENT_OTHER): Payer: Self-pay | Admitting: Emergency Medicine

## 2024-01-08 ENCOUNTER — Emergency Department (HOSPITAL_BASED_OUTPATIENT_CLINIC_OR_DEPARTMENT_OTHER)

## 2024-01-08 DIAGNOSIS — S0010XA Contusion of unspecified eyelid and periocular area, initial encounter: Secondary | ICD-10-CM

## 2024-01-08 DIAGNOSIS — S0012XA Contusion of left eyelid and periocular area, initial encounter: Secondary | ICD-10-CM | POA: Insufficient documentation

## 2024-01-08 DIAGNOSIS — S0591XA Unspecified injury of right eye and orbit, initial encounter: Secondary | ICD-10-CM | POA: Diagnosis present

## 2024-01-08 DIAGNOSIS — S0011XA Contusion of right eyelid and periocular area, initial encounter: Secondary | ICD-10-CM | POA: Insufficient documentation

## 2024-01-08 DIAGNOSIS — Y92410 Unspecified street and highway as the place of occurrence of the external cause: Secondary | ICD-10-CM | POA: Diagnosis not present

## 2024-01-08 LAB — BASIC METABOLIC PANEL WITH GFR
Anion gap: 12 (ref 5–15)
BUN: 8 mg/dL (ref 6–20)
CO2: 25 mmol/L (ref 22–32)
Calcium: 10.6 mg/dL — ABNORMAL HIGH (ref 8.9–10.3)
Chloride: 98 mmol/L (ref 98–111)
Creatinine, Ser: 0.96 mg/dL (ref 0.61–1.24)
GFR, Estimated: >60 mL/min (ref >60)
Glucose, Bld: 289 mg/dL — ABNORMAL HIGH (ref 70–99)
Potassium: 4 mmol/L (ref 3.5–5.1)
Sodium: 135 mmol/L (ref 135–145)

## 2024-01-08 LAB — CBC WITH DIFFERENTIAL/PLATELET
Abs Immature Granulocytes: 0.02 10*3/uL (ref 0.00–0.07)
Basophils Absolute: 0 10*3/uL (ref 0.0–0.1)
Basophils Relative: 0 %
Eosinophils Absolute: 0.1 10*3/uL (ref 0.0–0.5)
Eosinophils Relative: 2 %
HCT: 44 % (ref 39.0–52.0)
Hemoglobin: 15.5 g/dL (ref 13.0–17.0)
Immature Granulocytes: 0 %
Lymphocytes Relative: 31 %
Lymphs Abs: 1.8 10*3/uL (ref 0.7–4.0)
MCH: 30.5 pg (ref 26.0–34.0)
MCHC: 35.2 g/dL (ref 30.0–36.0)
MCV: 86.6 fL (ref 80.0–100.0)
Monocytes Absolute: 0.3 10*3/uL (ref 0.1–1.0)
Monocytes Relative: 6 %
Neutro Abs: 3.6 10*3/uL (ref 1.7–7.7)
Neutrophils Relative %: 61 %
Platelets: 173 10*3/uL (ref 150–400)
RBC: 5.08 MIL/uL (ref 4.22–5.81)
RDW: 12.4 % (ref 11.5–15.5)
WBC: 5.9 10*3/uL (ref 4.0–10.5)
nRBC: 0 % (ref 0.0–0.2)

## 2024-01-08 MED ORDER — TETRACAINE HCL 0.5 % OP SOLN
2.0000 [drp] | Freq: Once | OPHTHALMIC | Status: DC
  Filled 2024-01-08: qty 4

## 2024-01-08 MED ORDER — IOHEXOL 350 MG/ML SOLN
75.0000 mL | Freq: Once | INTRAVENOUS | Status: AC | PRN
  Administered 2024-01-08: 75 mL via INTRAVENOUS
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
## 2024-01-08 NOTE — ED Notes (Signed)
 Pt wears glasses for seeing distance... Not currently wearing glasses.Miguel AasAaron Sanders

## 2024-01-08 NOTE — ED Notes (Signed)
 Tonopen and pontocaine are in the room... Provider informed.Miguel AasAaron Sanders

## 2024-01-08 NOTE — ED Notes (Signed)
 Reviewed AVS/discharge instruction with patient. Time allotted for and all questions answered. Patient is agreeable for d/c and escorted to ed exit by staff.

## 2024-01-08 NOTE — ED Notes (Signed)
 Blood work obtained in [employer].Miguel AasAaron Sanders
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
## 2024-01-13 ENCOUNTER — Ambulatory Visit: Admitting: Family Medicine

## 2024-01-13 ENCOUNTER — Ambulatory Visit

## 2024-01-13 ENCOUNTER — Encounter: Payer: Self-pay | Admitting: Family Medicine

## 2024-01-13 VITALS — BP 126/80 | HR 100 | Resp 16 | Ht 74.0 in | Wt 343.0 lb

## 2024-01-13 DIAGNOSIS — S59901D Unspecified injury of right elbow, subsequent encounter: Secondary | ICD-10-CM

## 2024-01-13 DIAGNOSIS — S0101XD Laceration without foreign body of scalp, subsequent encounter: Secondary | ICD-10-CM | POA: Diagnosis not present

## 2024-01-13 DIAGNOSIS — R0789 Other chest pain: Secondary | ICD-10-CM

## 2024-01-13 MED ORDER — TRAMADOL HCL 50 MG PO TABS: 50.0000 mg | ORAL_TABLET | Freq: Two times a day (BID) | ORAL | 0 refills | Status: AC | PRN

## 2024-01-13 NOTE — Patient Instructions (Addendum)
 A few things to remember from today's visit:  Laceration of scalp, subsequent encounter  Injury of right elbow, subsequent encounter - Plan: DG Elbow Complete Right Over the counter Ibuprofen 2 mg 2 tabs 3 times per day with food for 7-10 days. Deep breathing a few times per day. Keep excoriations and scalp laceration clean with soap and water. Sun screen and hat when outdoors.  If you need refills for medications you take chronically, please call your pharmacy. Do not use My Chart to request refills or for acute issues that need immediate attention. If you send a my chart message, it may take a few days to be addressed, specially if I am not in the office.  Please be sure medication list is accurate. If a new problem present, please set up appointment sooner than planned today.
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
## 2024-01-16 ENCOUNTER — Ambulatory Visit: Payer: Self-pay | Admitting: Family Medicine

## 2024-01-31 ENCOUNTER — Other Ambulatory Visit: Payer: Self-pay | Admitting: Family Medicine

## 2024-01-31 DIAGNOSIS — M109 Gout, unspecified: Secondary | ICD-10-CM

## 2024-03-06 ENCOUNTER — Encounter: Payer: Self-pay | Admitting: Family Medicine

## 2024-03-06 ENCOUNTER — Ambulatory Visit: Admitting: Family Medicine

## 2024-03-06 VITALS — BP 128/80 | HR 82 | Resp 16 | Ht 74.0 in | Wt 345.1 lb

## 2024-03-06 DIAGNOSIS — E1169 Type 2 diabetes mellitus with other specified complication: Secondary | ICD-10-CM | POA: Diagnosis not present

## 2024-03-06 DIAGNOSIS — R809 Proteinuria, unspecified: Secondary | ICD-10-CM

## 2024-03-06 DIAGNOSIS — E538 Deficiency of other specified B group vitamins: Secondary | ICD-10-CM | POA: Diagnosis not present

## 2024-03-06 DIAGNOSIS — E1129 Type 2 diabetes mellitus with other diabetic kidney complication: Secondary | ICD-10-CM | POA: Diagnosis not present

## 2024-03-06 DIAGNOSIS — E559 Vitamin D deficiency, unspecified: Secondary | ICD-10-CM | POA: Insufficient documentation

## 2024-03-06 DIAGNOSIS — Z794 Long term (current) use of insulin: Secondary | ICD-10-CM

## 2024-03-06 LAB — POCT GLYCOSYLATED HEMOGLOBIN (HGB A1C): Hemoglobin A1C: 9.9 % — AB (ref 4.0–5.6)

## 2024-03-06 MED ORDER — LOSARTAN POTASSIUM 25 MG PO TABS: 25.0000 mg | ORAL_TABLET | Freq: Every day | ORAL | 0 refills | Status: DC

## 2024-03-06 MED ORDER — PIOGLITAZONE HCL 15 MG PO TABS: 15.0000 mg | ORAL_TABLET | Freq: Every day | ORAL | 1 refills | Status: AC

## 2024-03-06 NOTE — Assessment & Plan Note (Signed)
 Mild. Currently he is not on B12 supplementation. Recommend B12 2000 mcg once per week.

## 2024-03-06 NOTE — Assessment & Plan Note (Signed)
 Hemoglobin A1c is still not at goal stable at 9.9. He is health insurance does not cover GLP-1 agonist. He has not tolerated Jardiance  in the past, I caused recurrent balanitis. He has not been compliant with Novolin  before meals, so no changes. Continue Basaglar  55 units daily. Today Actos  15 mg daily added. Instructed to let me know about BS readings in 4 weeks. He is overdue for eye exam. Declined referral to nutritionist for diet education. Continue regular eye care, dental prevention, and footcare. Follow-up in 3 to 4 months.

## 2024-03-06 NOTE — Patient Instructions (Addendum)
 A few things to remember from today's visit:  Type 2 diabetes mellitus with other specified complication, with long-term current use of insulin  (HCC) - Plan: POC HgB A1c, pioglitazone  (ACTOS ) 15 MG tablet  Microalbuminuria due to type 2 diabetes mellitus (HCC) - Plan: losartan  (COZAAR ) 25 MG tablet Losartan  25 mg added today as well as Actos  15 mg for diabetes. Try to decrease carbs and eating out. No changes in insulins. Blood sugars in 4 weeks.  Vit B12 2000 mcg once per week. Vit D 2000 U daily.  If you need refills for medications you take chronically, please call your pharmacy. Do not use My Chart to request refills or for acute issues that need immediate attention. If you send a my chart message, it may take a few days to be addressed, specially if I am not in the office.  Please be sure medication list is accurate. If a new problem present, please set up appointment sooner than planned today.

## 2024-03-06 NOTE — Progress Notes (Signed)
 HPI: [name] is a 48 y.o. male with a PMHx significant for DM II, HLD, aortic atherosclerosis, insomnia, and gout, who is here today for chronic disease management.  Last seen on 01/13/24  Diabetes Mellitus II: Diagnosed in 2012. - Checking BG at home: Not been checking regularly; states his sugars ar in low 200s .  - Medications: Tradjenta  5 mg once daily, Basaglar  55 units daily, and Novolin  10 units TID before meals.  - Compliance: Compliant with Tradjenta  and Basaglar . Is 50% compliant with Novolin , stating it is difficult to carry his insulin  with him when working.  Had intolerances  - Diet: Tends to skip breakfast. Has been eating out for lunch. Eats home cooked meals for dinner. Drinking diet sodas. Has cut out sweets for 3-4 months.  - No formal exercise. Stays active at work.  - Overdue for an annual exam.Although he cannot recall when his last eye exam was, he states the eye doctor had no concerns related to his diabetes.  Wears glasses when driving.  - Foot exam: done  today - Negative for symptoms of hypoglycemia, polyuria, polydipsia,  foot ulcers/trauma. + Feet tingling/numbness.  Lab Results  Component Value Date   HGBA1C 9.9 (A) 03/06/2024   Lab Results  Component Value Date   MICROALBUR 30.6 (H) 12/06/2023   Hyperlipidemia: Currently on Rosuvastatin  20 mg once daily and Lopid  600 mg BID Side effects from medication: None.   Lab Results  Component Value Date   CHOL 139 08/23/2023   HDL 30.20 (L) 08/23/2023   LDLCALC 45 08/23/2023   LDLDIRECT 60.0 12/17/2022   TRIG 316.0 (H) 08/23/2023   CHOLHDL 5 08/23/2023   Depression: Lack of motivation. Pt is not taking Wellbutrin  XL; pt states it was ineffective.  Denies any SI/HI   Vitamin Deficiency; Pt was advised to take 2,000 mcg once weekly of B12 supplementation and 2,000 units daily of vitamin D .  He has not ween taking supplementation,  Lab Results  Component Value Date   VD25OH 21.43 (L)  08/23/2023   Lab Results  Component Value Date   VITAMINB12 393 08/23/2023   Review of Systems  Constitutional:  Positive for fatigue. Negative for activity change, appetite change and fever.  HENT:  Negative for sore throat.   Respiratory:  Negative for cough and wheezing.   Gastrointestinal:  Negative for abdominal pain, nausea and vomiting.  Endocrine: Negative for cold intolerance and heat intolerance.  Genitourinary:  Negative for decreased urine volume, dysuria and hematuria.  Neurological:  Negative for syncope, facial asymmetry, weakness and headaches.  See other pertinent positives and negatives in HPI.  Current Outpatient Medications on File Prior to Visit  Medication Sig Dispense Refill   allopurinol  (ZYLOPRIM ) 100 MG tablet TAKE 1 TABLET(100 MG) BY MOUTH TWICE DAILY 180 tablet 3   blood glucose meter kit and supplies KIT Dispense based on patient and insurance preference. Use up to four times daily as directed. 1 each 0   Continuous Glucose Receiver (DEXCOM G7 RECEIVER) DEVI 1 Device by Does not apply route as directed. 1 each 1   Continuous Glucose Sensor (DEXCOM G7 SENSOR) MISC 1 every 10 days 6 each 2   gemfibrozil  (LOPID ) 600 MG tablet TAKE 1 TABLET(600 MG) BY MOUTH TWICE DAILY BEFORE A MEAL 180 tablet 1   icosapent  Ethyl (VASCEPA ) 1 g capsule Take 2 capsules (2 g total) by mouth 2 (two) times daily. 120 capsule 6   Insulin  Glargine (BASAGLAR  KWIKPEN) 100 UNIT/ML Inject 55  Units into the skin daily.     Insulin  Pen Needle (B-D UF III MINI PEN NEEDLES) 31G X 5 MM MISC USE 1 PEN NEEDLE WITH SAXENDA  DAILY AS DIRECTED 100 each 3   Insulin  Regular Human (NOVOLIN  R FLEXPEN RELION) 100 UNIT/ML KwikPen Inject 10 Units as directed 3 (three) times daily before meals. 6 mL 2   linagliptin  (TRADJENTA ) 5 MG TABS tablet Take 1 tablet (5 mg total) by mouth daily. 90 tablet 2   MITIGARE  0.6 MG CAPS TAKE 1 TABLET BY MOUTH DAILY AS NEEDED. HOLD ATORVASTAIN WHEN TAKING THIS MEDICATION 30  capsule 3   rosuvastatin  (CRESTOR ) 20 MG tablet TAKE 1 TABLET(20 MG) BY MOUTH DAILY 90 tablet 3   No current facility-administered medications on file prior to visit.   Past Medical History:  Diagnosis Date   Diabetes mellitus without complication (HCC)    GERD (gastroesophageal reflux disease)    Gout    Hx of adenomatous colonic polyps 08/31/2022   08/2022 - 3 polyps max 8 mm 1 TV adenoma + 2 TA's recall 2027   Hyperlipidemia    Hypertension    [place] [facility]   Obesity, Class III, BMI 40-49.9 (morbid obesity)    Sleep apnea    NO C PAP   No Known Allergies  Social History   Socioeconomic History   Marital status: Married    Spouse name: Not on file   Number of children: 2   Years of education: Not on file   Highest education level: Not on file  Occupational History   Occupation: unemployed  Tobacco Use   Smoking status: Former    Current packs/day: 0.00    Average packs/day: 1.5 packs/day for 10.0 years (15.0 ttl pk-yrs)    Types: Cigarettes    Start date: 08/10/1999    Quit date: 08/09/2009    Years since quitting: 14.5    Passive exposure: Past   Smokeless tobacco: Never  Vaping Use   Vaping status: Never Used  Substance and Sexual Activity   Alcohol use: Yes    Comment: OCCASSIONALY   Drug use: No   Sexual activity: Yes  Other Topics Concern   Not on file  Social History Narrative   Marital status: married x 5 years. Separated from wife in 08/2012.     Children: 2 children from previous marriage; gets children every two weeks; sees more frequently.     Lives: alone; separated from wife in 08/2012.     Employment: Conservator, museum/gallery.      Tobacco: quit; smoked x 13 years.      Alcohol: once every three months.      Drugs: none now; marijuana in past.      Exercise: started going to gym in 08/2012; going 4-5 days per week; elliptical and stationary bike.            Social Drivers of Health   Financial Resource Strain: Patient Declined (09/16/2022)   Overall  Financial Resource Strain (CARDIA)    Difficulty of Paying Living Expenses: Patient declined  Food Insecurity: Patient Declined (09/16/2022)   Hunger Vital Sign    Worried About Running Out of Food in the Last Year: Patient declined    Ran Out of Food in the Last Year: Patient declined  Transportation Needs: No Transportation Needs (09/16/2022)   PRAPARE - Administrator, Civil Service (Medical): No    Lack of Transportation (Non-Medical): No  Physical Activity: Unknown (09/16/2022)   Exercise Vital Sign  Days of Exercise per Week: 0 days    Minutes of Exercise per Session: Not on file  Stress: No Stress Concern Present (09/16/2022)   [employer] of Occupational Health - Occupational Stress Questionnaire    Feeling of Stress : Only a little  Social Connections: Unknown (09/16/2022)   Social Connection and Isolation Panel    Frequency of Communication with Friends and Family: Not on file    Frequency of Social Gatherings with Friends and Family: Not on file    Attends Religious Services: Not on file    Active Member of Clubs or Organizations: Patient declined    Attends Banker Meetings: Not on file    Marital Status: Not on file   Vitals:   03/06/24 0703  BP: 128/80  Pulse: 82  Resp: 16  SpO2: 98%   Body mass index is 44.31 kg/m.  Wt Readings from Last 3 Encounters:  03/06/24 (!) 345 lb 2 oz (156.5 kg)  01/13/24 (!) 343 lb (155.6 kg)  12/06/23 (!) 347 lb 2 oz (157.5 kg)   Physical Exam Vitals and nursing note reviewed.  Constitutional:      General: He is not in acute distress.    Appearance: He is well-developed.  HENT:     Head: Normocephalic and atraumatic.     Mouth/Throat:     Pharynx: Uvula midline.  Eyes:     Conjunctiva/sclera: Conjunctivae normal.  Cardiovascular:     Rate and Rhythm: Normal rate and regular rhythm.     Pulses:          Dorsalis pedis pulses are 2+ on the right side and 2+ on the left side.     Heart sounds: No  murmur heard. Pulmonary:     Effort: Pulmonary effort is normal. No respiratory distress.     Breath sounds: Normal breath sounds.  Abdominal:     Palpations: Abdomen is soft. There is no hepatomegaly or mass.     Tenderness: There is no abdominal tenderness.  Musculoskeletal:     Right lower leg: No edema.     Left lower leg: No edema.  Skin:    General: Skin is warm.     Findings: No erythema or rash.  Neurological:     Mental Status: He is alert and oriented to person, place, and time.     Cranial Nerves: No cranial nerve deficit.     Gait: Gait normal.  Psychiatric:        Mood and Affect: Mood and affect normal.   ASSESSMENT AND PLAN: Mr. Griffith Santilli was seen today for chronic disease management.  Orders Placed This Encounter  Procedures   POC HgB A1c   Lab Results  Component Value Date   HGBA1C 9.9 (A) 03/06/2024   Microalbuminuria due to type 2 diabetes mellitus (HCC) Assessment & Plan: We discussed possible complications of poorly controlled glucose. Losartan  25 mg daily added today. In the past he has not tolerated SGLT2 inhibitors, recurrent balanitis. History of the importance of better glucose control to prevent progression. Microalbumin/creatinine ratio to be done with next labs in 7 to 10 days.  Orders: -     Losartan  Potassium; Take 1 tablet (25 mg total) by mouth daily.  Dispense: 90 tablet; Refill: 0  Type 2 diabetes mellitus with other specified complication, with long-term current use of insulin  (HCC) Assessment & Plan: Hemoglobin A1c is still not at goal stable at 9.9. He is health insurance does not cover GLP-1 agonist.  He has not tolerated Jardiance  in the past, I caused recurrent balanitis. He has not been compliant with Novolin  before meals, so no changes. Continue Basaglar  55 units daily. Today Actos  15 mg daily added. Instructed to let me know about BS readings in 4 weeks. He is overdue for eye exam. Declined referral to  nutritionist for diet education. Continue regular eye care, dental prevention, and footcare. Follow-up in 3 to 4 months.  Orders: -     POCT glycosylated hemoglobin (Hb A1C) -     Pioglitazone  HCl; Take 1 tablet (15 mg total) by mouth daily.  Dispense: 90 tablet; Refill: 1  B12 deficiency Assessment & Plan: Mild. Currently he is not on B12 supplementation. Recommend B12 2000 mcg once per week.   Vitamin D  deficiency, unspecified Assessment & Plan: He is not on vitamin D  supplementation. Recommend OTC vitamin D  2000 units daily.   Return in about 3 months (around 06/15/2024) for chronic problems. Labs in 7-10 days.SABRA LILLETTE Vernell Gailen, acting as a scribe for Shauntelle Jamerson [place], MD., have documented all relevant documentation on the behalf of Shaniquia Brafford [place], MD, as directed by   while in the presence of Zoiee Wimmer [place], MD.  I, Marcellas Marchant [place], MD, have reviewed all documentation for this visit. The documentation on 03/06/24 for the exam, diagnosis, procedures, and orders are all accurate and complete.  Damiyah Ditmars G. [place], MD  [facility] office

## 2024-03-06 NOTE — Assessment & Plan Note (Signed)
 We discussed possible complications of poorly controlled glucose. Losartan  25 mg daily added today. In the past he has not tolerated SGLT2 inhibitors, recurrent balanitis. History of the importance of better glucose control to prevent progression. Microalbumin/creatinine ratio to be done with next labs in 7 to 10 days.

## 2024-03-06 NOTE — Assessment & Plan Note (Signed)
 He is not on vitamin D  supplementation. Recommend OTC vitamin D  2000 units daily.

## 2024-03-10 ENCOUNTER — Other Ambulatory Visit: Payer: Self-pay | Admitting: Family Medicine

## 2024-03-10 DIAGNOSIS — Z794 Long term (current) use of insulin: Secondary | ICD-10-CM

## 2024-03-16 ENCOUNTER — Ambulatory Visit: Payer: Self-pay | Admitting: Family Medicine

## 2024-03-16 ENCOUNTER — Other Ambulatory Visit (INDEPENDENT_AMBULATORY_CARE_PROVIDER_SITE_OTHER)

## 2024-03-16 DIAGNOSIS — E1169 Type 2 diabetes mellitus with other specified complication: Secondary | ICD-10-CM

## 2024-03-16 DIAGNOSIS — Z794 Long term (current) use of insulin: Secondary | ICD-10-CM | POA: Diagnosis not present

## 2024-03-16 DIAGNOSIS — R809 Proteinuria, unspecified: Secondary | ICD-10-CM

## 2024-03-16 DIAGNOSIS — E1129 Type 2 diabetes mellitus with other diabetic kidney complication: Secondary | ICD-10-CM

## 2024-03-16 LAB — BASIC METABOLIC PANEL WITH GFR
BUN: 18 mg/dL (ref 6–23)
CO2: 25 meq/L (ref 19–32)
Calcium: 9.5 mg/dL (ref 8.4–10.5)
Chloride: 101 meq/L (ref 96–112)
Creatinine, Ser: 0.7 mg/dL (ref 0.40–1.50)
GFR: 110.15 mL/min (ref >60.00)
Glucose, Bld: 234 mg/dL — ABNORMAL HIGH (ref 70–99)
Potassium: 4.1 meq/L (ref 3.5–5.1)
Sodium: 137 meq/L (ref 135–145)

## 2024-03-16 LAB — MICROALBUMIN / CREATININE URINE RATIO
Creatinine,U: 110.9 mg/dL
Microalb Creat Ratio: 115.3 mg/g — ABNORMAL HIGH (ref 0.0–30.0)
Microalb, Ur: 12.8 mg/dL — ABNORMAL HIGH (ref 0.0–1.9)

## 2024-05-10 ENCOUNTER — Other Ambulatory Visit: Payer: Self-pay | Admitting: Family Medicine

## 2024-05-11 ENCOUNTER — Other Ambulatory Visit: Payer: Self-pay | Admitting: Family Medicine

## 2024-05-11 DIAGNOSIS — E1169 Type 2 diabetes mellitus with other specified complication: Secondary | ICD-10-CM

## 2024-06-14 ENCOUNTER — Other Ambulatory Visit: Payer: Self-pay | Admitting: Family Medicine

## 2024-06-14 DIAGNOSIS — E1129 Type 2 diabetes mellitus with other diabetic kidney complication: Secondary | ICD-10-CM

## 2024-07-02 ENCOUNTER — Other Ambulatory Visit: Payer: Self-pay | Admitting: Family Medicine

## 2024-07-02 DIAGNOSIS — E1169 Type 2 diabetes mellitus with other specified complication: Secondary | ICD-10-CM

## 2024-07-10 ENCOUNTER — Encounter: Payer: Self-pay | Admitting: Family Medicine

## 2024-07-10 ENCOUNTER — Ambulatory Visit: Admitting: Family Medicine

## 2024-07-10 VITALS — BP 128/70 | HR 79 | Temp 97.9°F | Resp 16 | Ht 74.0 in | Wt 367.6 lb

## 2024-07-10 DIAGNOSIS — Z794 Long term (current) use of insulin: Secondary | ICD-10-CM | POA: Diagnosis not present

## 2024-07-10 DIAGNOSIS — E1169 Type 2 diabetes mellitus with other specified complication: Secondary | ICD-10-CM

## 2024-07-10 DIAGNOSIS — M109 Gout, unspecified: Secondary | ICD-10-CM

## 2024-07-10 DIAGNOSIS — N529 Male erectile dysfunction, unspecified: Secondary | ICD-10-CM | POA: Diagnosis not present

## 2024-07-10 DIAGNOSIS — M25561 Pain in right knee: Secondary | ICD-10-CM

## 2024-07-10 DIAGNOSIS — N181 Chronic kidney disease, stage 1: Secondary | ICD-10-CM | POA: Insufficient documentation

## 2024-07-10 LAB — POCT GLYCOSYLATED HEMOGLOBIN (HGB A1C): Hemoglobin A1C: 7.7 % — AB (ref 4.0–5.6)

## 2024-07-10 MED ORDER — COLCHICINE 0.6 MG PO CAPS: 0.6000 mg | ORAL_CAPSULE | Freq: Every day | ORAL | 3 refills | Status: AC | PRN

## 2024-07-10 MED ORDER — PREDNISONE 20 MG PO TABS: 40.0000 mg | ORAL_TABLET | Freq: Every day | ORAL | 0 refills | Status: AC

## 2024-07-10 NOTE — Progress Notes (Signed)
 ACUTE VISIT Chief Complaint  Patient presents with   Acute Visit    Gout flare up    Discussed the use of AI scribe software for clinical note transcription with the patient, who gave verbal consent to proceed. History of Present Illness Miguel Sanders is a 48 year old male with past medical history significant for DM 2, hyperlipidemia, aortic atherosclerosis, and gout here today complaining of knee pain, which he attributes to gout attack.  He is experiencing arthralgias primarily affecting his right knee, with involvement of all his joints. He missed his allopurinol  medication for two days, which he believes triggered the attack. The pain is rated as 4 or 5 out of 10, particularly noticeable when standing after prolonged sitting. No significant swelling or redness is present, which is consistent with his usual gout attacks. He has a history of gout affecting both knees and ankles, but not his fingers or big toes. He has previously taken colchicine  and prednisone  for his gout attacks and meds have been effective.  -He reports erectile dysfunction for the past six months, with no morning erections and difficulty maintaining an erection. He has tried Cialis and Viagra , with limited success, noting that he can achieve an erection but cannot maintain it. He has experimented with different dosages, including 25 and 50 mg of Viagra , but has not tried the maximum dose. He mentions that taking these medications seems to lower his blood sugar, well tolerated otherwise.  -DM2 diagnosed in 2012. Currently is on Tradjenta  5 mg daily, Basaglar  55 units daily, and Novolin  10 units 3 times per day before meals.  He has not been using Novolin  before meals as prescribed. He decreased meal portions. Negative for polyuria, polydipsia, polyphagia. He has not been checking his blood sugar regularly due to a dead battery in his glucometer.  His proteinuria has improved, with levels decreasing from 275 to  115. He has noted foam in his urine occasionally.  Negative for gross hematuria or decreased urine output.  Lab Results  Component Value Date   HGBA1C 9.9 (A) 03/06/2024   Lab Results  Component Value Date   MICROALBUR 12.8 (H) 03/16/2024   MICROALBUR 30.6 (H) 12/06/2023   Lab Results  Component Value Date   NA 137 03/16/2024   CL 101 03/16/2024   K 4.1 03/16/2024   CO2 25 03/16/2024   BUN 18 03/16/2024   CREATININE 0.70 03/16/2024   GFR 110.15 03/16/2024   CALCIUM  9.5 03/16/2024   ALBUMIN 4.3 08/23/2023   GLUCOSE 234 (H) 03/16/2024   Review of Systems  Constitutional:  Negative for activity change, appetite change, chills and fever.  HENT:  Negative for sore throat.   Respiratory:  Negative for cough and shortness of breath.   Cardiovascular:  Negative for chest pain and leg swelling.  Gastrointestinal:  Negative for abdominal pain, nausea and vomiting.  Genitourinary:  Negative for dysuria, flank pain and penile pain.  Skin:  Negative for rash.  Neurological:  Negative for syncope and weakness.  See other pertinent positives and negatives in HPI.  Current Outpatient Medications on File Prior to Visit  Medication Sig Dispense Refill   allopurinol  (ZYLOPRIM ) 100 MG tablet TAKE 1 TABLET(100 MG) BY MOUTH TWICE DAILY 180 tablet 3   blood glucose meter kit and supplies KIT Dispense based on patient and insurance preference. Use up to four times daily as directed. 1 each 0   Continuous Glucose Receiver (DEXCOM G7 RECEIVER) DEVI 1 Device by Does not apply  route as directed. 1 each 1   Continuous Glucose Sensor (DEXCOM G7 SENSOR) MISC 1 every 10 days 6 each 2   gemfibrozil  (LOPID ) 600 MG tablet TAKE 1 TABLET(600 MG) BY MOUTH TWICE DAILY BEFORE A MEAL 180 tablet 1   icosapent  Ethyl (VASCEPA ) 1 g capsule Take 2 capsules (2 g total) by mouth 2 (two) times daily. 120 capsule 6   Insulin  Glargine (BASAGLAR  KWIKPEN) 100 UNIT/ML ADMINISTER 25 UNITS UNDER THE SKIN DAILY. 15 mL 3    Insulin  Pen Needle (B-D UF III MINI PEN NEEDLES) 31G X 5 MM MISC USE 1 PEN NEEDLE WITH SAXENDA  DAILY AS DIRECTED 100 each 3   linagliptin  (TRADJENTA ) 5 MG TABS tablet Take 1 tablet (5 mg total) by mouth daily. 90 tablet 2   losartan  (COZAAR ) 25 MG tablet TAKE 1 TABLET(25 MG) BY MOUTH DAILY 90 tablet 0   NOVOLIN  R FLEXPEN RELION 100 UNIT/ML FlexPen INJECT 10 UNITS AS DIRECTED 3 TIMES DAILY BEFORE MEALS 6 mL 2   pioglitazone  (ACTOS ) 15 MG tablet Take 1 tablet (15 mg total) by mouth daily. 90 tablet 1   rosuvastatin  (CRESTOR ) 20 MG tablet TAKE 1 TABLET(20 MG) BY MOUTH DAILY 90 tablet 3   No current facility-administered medications on file prior to visit.   Past Medical History:  Diagnosis Date   Diabetes mellitus without complication (HCC)    GERD (gastroesophageal reflux disease)    Gout    Hx of adenomatous colonic polyps 08/31/2022   08/2022 - 3 polyps max 8 mm 1 TV adenoma + 2 TA's recall 2027   Hyperlipidemia    Hypertension    [place] MED'S   Obesity, Class III, BMI 40-49.9 (morbid obesity) (HCC)    Sleep apnea    NO C PAP   No Known Allergies  Social History   Socioeconomic History   Marital status: Married    Spouse name: Not on file   Number of children: 2   Years of education: Not on file   Highest education level: Not on file  Occupational History   Occupation: unemployed  Tobacco Use   Smoking status: Former    Current packs/day: 0.00    Average packs/day: 1.5 packs/day for 10.0 years (15.0 ttl pk-yrs)    Types: Cigarettes    Start date: 08/10/1999    Quit date: 08/09/2009    Years since quitting: 14.9    Passive exposure: Past   Smokeless tobacco: Never  Vaping Use   Vaping status: Never Used  Substance and Sexual Activity   Alcohol use: Yes    Comment: OCCASSIONALY   Drug use: No   Sexual activity: Yes  Other Topics Concern   Not on file  Social History Narrative   Marital status: married x 5 years. Separated from wife in 08/2012.     Children: 2 children from  previous marriage; gets children every two weeks; sees more frequently.     Lives: alone; separated from wife in 08/2012.     Employment: Conservator, Museum/gallery.      Tobacco: quit; smoked x 13 years.      Alcohol: once every three months.      Drugs: none now; marijuana in past.      Exercise: started going to gym in 08/2012; going 4-5 days per week; elliptical and stationary bike.            Social Drivers of Health   Financial Resource Strain: Patient Declined (09/16/2022)   Overall Financial Resource Strain (CARDIA)  Difficulty of Paying Living Expenses: Patient declined  Food Insecurity: Patient Declined (09/16/2022)   Hunger Vital Sign    Worried About Running Out of Food in the Last Year: Patient declined    Ran Out of Food in the Last Year: Patient declined  Transportation Needs: No Transportation Needs (09/16/2022)   PRAPARE - Administrator, Civil Service (Medical): No    Lack of Transportation (Non-Medical): No  Physical Activity: Unknown (09/16/2022)   Exercise Vital Sign    Days of Exercise per Week: 0 days    Minutes of Exercise per Session: Not on file  Stress: No Stress Concern Present (09/16/2022)   [employer] of Occupational Health - Occupational Stress Questionnaire    Feeling of Stress : Only a little  Social Connections: Unknown (09/16/2022)   Social Connection and Isolation Panel    Frequency of Communication with Friends and Family: Not on file    Frequency of Social Gatherings with Friends and Family: Not on file    Attends Religious Services: Not on file    Active Member of Clubs or Organizations: Patient declined    Attends Banker Meetings: Not on file    Marital Status: Not on file    Vitals:   07/10/24 0717  BP: 128/70  Pulse: 79  Resp: 16  Temp: 97.9 F (36.6 C)  SpO2: 99%   Wt Readings from Last 3 Encounters:  07/10/24 (!) 367 lb 9.6 oz (166.7 kg)  03/06/24 (!) 345 lb 2 oz (156.5 kg)  01/13/24 (!) 343 lb (155.6 kg)    Body mass index is 47.2 kg/m.  Physical Exam Vitals and nursing note reviewed.  Constitutional:      General: He is not in acute distress.    Appearance: He is well-developed.  HENT:     Head: Normocephalic and atraumatic.  Eyes:     Conjunctiva/sclera: Conjunctivae normal.  Cardiovascular:     Rate and Rhythm: Normal rate and regular rhythm.     Pulses:          Dorsalis pedis pulses are 2+ on the right side and 2+ on the left side.     Heart sounds: No murmur heard. Pulmonary:     Effort: Pulmonary effort is normal. No respiratory distress.     Breath sounds: Normal breath sounds.  Abdominal:     Palpations: Abdomen is soft. There is no mass.     Tenderness: There is no abdominal tenderness.  Musculoskeletal:     Right knee: Swelling (minimal) and crepitus present. No deformity or erythema. Normal range of motion.     Left knee: Crepitus present. No swelling or erythema. Normal range of motion.     Comments: No signs of synovitis.  Skin:    General: Skin is warm.     Findings: No erythema or rash.  Neurological:     General: No focal deficit present.     Mental Status: He is alert and oriented to person, place, and time.     Gait: Gait normal.  Psychiatric:        Mood and Affect: Mood and affect normal.   ASSESSMENT AND PLAN:  Mr. Atteberry was seen today for knee pain and follow up. Orders Placed This Encounter  Procedures   Ambulatory referral to Urology   POC HgB A1c   Lab Results  Component Value Date   HGBA1C 7.7 (A) 07/10/2024    Arthralgia of right knee As well as left knee. We  discussed differential diagnosis, he feels like this is his typical gout attack, so we will treat as such. OA is also to be considered.  Type 2 diabetes mellitus with other specified complication, with long-term current use of insulin  [facility]) Assessment & Plan: Hemoglobin A1c is still not at goal but improved, it went from 9.9 to 7.7. He is health insurance does not cover GLP-1  agonist. He has not tolerated Jardiance  in the past, I caused recurrent balanitis. He has not been compliant with Novolin  before meals, so no changes. Basaglar  dose increased from 55 units to 60 units. Continue Actos  15 mg daily. Instructed to let me know about BS readings in 4 weeks. We discussed possible complications of elevated glucose. Encouraged to continue positive dietary changes. Continue regular eye care, dental prevention, and footcare. Follow-up in 3 to 4 months.  Orders: -     POCT glycosylated hemoglobin (Hb A1C)  Erectile dysfunction, unspecified erectile dysfunction type Assessment & Plan: We discussed possible etiologies, including some of her yeast medications and chronic comorbidities. He has tried different medication, including Cialis and Viagra  but have not been effective. For now I recommend trying the max dose of Viagra , 100 mg daily as needed.  We discussed some side effects of medications. Appointment with urology will be arranged.  Orders: -     Ambulatory referral to Urology  Gouty arthropathy Assessment & Plan: Continue allopurinol  100 mg daily and low purine diet. Today treatment for acute gout exacerbation provided, we discussed son side effects of prednisone  and colchicine .  Orders: -     predniSONE ; Take 2 tablets (40 mg total) by mouth daily with breakfast for 5 days.  Dispense: 10 tablet; Refill: 0 -     Colchicine ; Take 1 capsule (0.6 mg total) by mouth daily as needed.  Dispense: 30 capsule; Refill: 3  CKD (chronic kidney disease), stage I Assessment & Plan: Labs protein/creatinine ratio improved, it went from 275 to 115.  Stressed the importance of better glucose control to prevent progression. Continue adequate hydration and low-salt diet.   Return in about 4 months (around 11/09/2024) for chronic problems.  Guenther Dunshee G. Atasha Colebank, MD  [facility]. [place] office.

## 2024-07-10 NOTE — Assessment & Plan Note (Signed)
 Hemoglobin A1c is still not at goal but improved, it went from 9.9 to 7.7. He is health insurance does not cover GLP-1 agonist. He has not tolerated Jardiance  in the past, I caused recurrent balanitis. He has not been compliant with Novolin  before meals, so no changes. Basaglar  dose increased from 55 units to 60 units. Continue Actos  15 mg daily. Instructed to let me know about BS readings in 4 weeks. We discussed possible complications of elevated glucose. Encouraged to continue positive dietary changes. Continue regular eye care, dental prevention, and footcare. Follow-up in 3 to 4 months.

## 2024-07-10 NOTE — Patient Instructions (Addendum)
 A few things to remember from today's visit:  Type 2 diabetes mellitus with other specified complication, with long-term current use of insulin  (HCC) - Plan: POC HgB A1c  Erectile dysfunction, unspecified erectile dysfunction type - Plan: Ambulatory referral to Urology  Arthralgia of right knee  Gouty arthropathy - Plan: predniSONE  (DELTASONE ) 20 MG tablet, Colchicine  (MITIGARE ) 0.6 MG CAPS  History of acute gouty arthritis - Plan: Colchicine  (MITIGARE ) 0.6 MG CAPS  Prednisone  2 tabs with breakfast for 5 days, monitor blood sugar closer. Colchicine  0.6 mg 2 tabs today and continue daily for a week then as needed. Continue Allopurinol . Basaglar  increased from 55 U to 60 U. No changes in rest.  Try viagra  100 mg daily as needed.  If you need refills for medications you take chronically, please call your pharmacy. Do not use My Chart to request refills or for acute issues that need immediate attention. If you send a my chart message, it may take a few days to be addressed, specially if I am not in the office.  Please be sure medication list is accurate. If a new problem present, please set up appointment sooner than planned today.

## 2024-07-10 NOTE — Assessment & Plan Note (Signed)
 We discussed possible etiologies, including some of her yeast medications and chronic comorbidities. He has tried different medication, including Cialis and Viagra  but have not been effective. For now I recommend trying the max dose of Viagra , 100 mg daily as needed.  We discussed some side effects of medications. Appointment with urology will be arranged.

## 2024-07-10 NOTE — Assessment & Plan Note (Signed)
 Labs protein/creatinine ratio improved, it went from 275 to 115.  Stressed the importance of better glucose control to prevent progression. Continue adequate hydration and low-salt diet.

## 2024-07-10 NOTE — Assessment & Plan Note (Addendum)
 Continue allopurinol  100 mg daily and low purine diet. Today treatment for acute gout exacerbation provided, we discussed son side effects of prednisone  and colchicine .

## 2024-08-13 ENCOUNTER — Other Ambulatory Visit: Payer: Self-pay | Admitting: Family Medicine

## 2024-08-18 LAB — OPHTHALMOLOGY REPORT-SCANNED
